# Patient Record
Sex: Female | Born: 1960 | Race: White | Hispanic: No | Marital: Married | Smoking: Never smoker
Health system: Southern US, Community
[De-identification: ages and names within clinical notes are randomized; demographics above are authoritative.]

## PROBLEM LIST (undated history)

## (undated) DIAGNOSIS — K8020 Calculus of gallbladder without cholecystitis without obstruction: Secondary | ICD-10-CM

## (undated) DIAGNOSIS — K219 Gastro-esophageal reflux disease without esophagitis: Secondary | ICD-10-CM

## (undated) DIAGNOSIS — E669 Obesity, unspecified: Secondary | ICD-10-CM

## (undated) DIAGNOSIS — Z87442 Personal history of urinary calculi: Secondary | ICD-10-CM

## (undated) DIAGNOSIS — F329 Major depressive disorder, single episode, unspecified: Secondary | ICD-10-CM

## (undated) DIAGNOSIS — J310 Chronic rhinitis: Secondary | ICD-10-CM

## (undated) DIAGNOSIS — K5792 Diverticulitis of intestine, part unspecified, without perforation or abscess without bleeding: Secondary | ICD-10-CM

## (undated) DIAGNOSIS — K5780 Diverticulitis of intestine, part unspecified, with perforation and abscess without bleeding: Secondary | ICD-10-CM

## (undated) DIAGNOSIS — I10 Essential (primary) hypertension: Secondary | ICD-10-CM

## (undated) DIAGNOSIS — E785 Hyperlipidemia, unspecified: Secondary | ICD-10-CM

## (undated) DIAGNOSIS — Z96659 Presence of unspecified artificial knee joint: Secondary | ICD-10-CM

## (undated) DIAGNOSIS — J189 Pneumonia, unspecified organism: Secondary | ICD-10-CM

## (undated) DIAGNOSIS — N289 Disorder of kidney and ureter, unspecified: Secondary | ICD-10-CM

## (undated) DIAGNOSIS — F419 Anxiety disorder, unspecified: Secondary | ICD-10-CM

## (undated) DIAGNOSIS — F32A Depression, unspecified: Secondary | ICD-10-CM

## (undated) HISTORY — DX: Calculus of gallbladder without cholecystitis without obstruction: K80.20

## (undated) HISTORY — DX: Hyperlipidemia, unspecified: E78.5

## (undated) HISTORY — DX: Anxiety disorder, unspecified: F41.9

## (undated) HISTORY — PX: TONSILLECTOMY: SUR1361

## (undated) HISTORY — DX: Essential (primary) hypertension: I10

## (undated) HISTORY — DX: Obesity, unspecified: E66.9

---
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
## 1997-12-11 ENCOUNTER — Other Ambulatory Visit: Admission: RE | Admit: 1997-12-11 | Discharge: 1997-12-11 | Payer: Self-pay | Admitting: Obstetrics and Gynecology

## 1999-05-22 ENCOUNTER — Other Ambulatory Visit: Admission: RE | Admit: 1999-05-22 | Discharge: 1999-05-22 | Payer: Self-pay | Admitting: *Deleted

## 1999-10-18 ENCOUNTER — Encounter: Payer: Self-pay | Admitting: Emergency Medicine

## 1999-10-18 ENCOUNTER — Emergency Department (HOSPITAL_COMMUNITY): Admission: EM | Admit: 1999-10-18 | Discharge: 1999-10-18 | Payer: Self-pay | Admitting: Emergency Medicine

## 2001-02-23 HISTORY — PX: OTHER SURGICAL HISTORY: SHX169

## 2002-04-04 ENCOUNTER — Encounter: Admission: RE | Admit: 2002-04-04 | Discharge: 2002-04-04 | Payer: Self-pay | Admitting: Occupational Medicine

## 2002-04-04 ENCOUNTER — Encounter: Payer: Self-pay | Admitting: Occupational Medicine

## 2002-05-29 ENCOUNTER — Emergency Department (HOSPITAL_COMMUNITY): Admission: EM | Admit: 2002-05-29 | Discharge: 2002-05-29 | Payer: Self-pay | Admitting: Emergency Medicine

## 2003-03-23 ENCOUNTER — Emergency Department (HOSPITAL_COMMUNITY): Admission: AD | Admit: 2003-03-23 | Discharge: 2003-03-23 | Payer: Self-pay | Admitting: Family Medicine

## 2003-04-10 ENCOUNTER — Encounter: Admission: RE | Admit: 2003-04-10 | Discharge: 2003-04-10 | Payer: Self-pay | Admitting: Internal Medicine

## 2003-06-23 ENCOUNTER — Emergency Department (HOSPITAL_COMMUNITY): Admission: EM | Admit: 2003-06-23 | Discharge: 2003-06-23 | Payer: Self-pay | Admitting: Family Medicine

## 2003-08-03 ENCOUNTER — Encounter (INDEPENDENT_AMBULATORY_CARE_PROVIDER_SITE_OTHER): Payer: Self-pay | Admitting: *Deleted

## 2003-08-03 ENCOUNTER — Ambulatory Visit (HOSPITAL_COMMUNITY): Admission: RE | Admit: 2003-08-03 | Discharge: 2003-08-04 | Payer: Self-pay | Admitting: General Surgery

## 2003-12-14 ENCOUNTER — Emergency Department (HOSPITAL_COMMUNITY): Admission: EM | Admit: 2003-12-14 | Discharge: 2003-12-14 | Payer: Self-pay | Admitting: Emergency Medicine

## 2004-07-13 ENCOUNTER — Emergency Department (HOSPITAL_COMMUNITY): Admission: EM | Admit: 2004-07-13 | Discharge: 2004-07-13 | Payer: Self-pay | Admitting: Family Medicine

## 2005-11-06 ENCOUNTER — Emergency Department (HOSPITAL_COMMUNITY): Admission: EM | Admit: 2005-11-06 | Discharge: 2005-11-06 | Payer: Self-pay | Admitting: Family Medicine

## 2006-03-27 ENCOUNTER — Emergency Department (HOSPITAL_COMMUNITY): Admission: EM | Admit: 2006-03-27 | Discharge: 2006-03-27 | Payer: Self-pay | Admitting: Emergency Medicine

## 2006-04-16 ENCOUNTER — Encounter (INDEPENDENT_AMBULATORY_CARE_PROVIDER_SITE_OTHER): Payer: Self-pay | Admitting: Specialist

## 2006-04-16 ENCOUNTER — Ambulatory Visit (HOSPITAL_COMMUNITY): Admission: RE | Admit: 2006-04-16 | Discharge: 2006-04-16 | Payer: Self-pay | Admitting: Obstetrics and Gynecology

## 2006-09-11 ENCOUNTER — Emergency Department (HOSPITAL_COMMUNITY): Admission: EM | Admit: 2006-09-11 | Discharge: 2006-09-11 | Payer: Self-pay | Admitting: Family Medicine

## 2007-06-21 ENCOUNTER — Emergency Department (HOSPITAL_COMMUNITY): Admission: EM | Admit: 2007-06-21 | Discharge: 2007-06-21 | Payer: Self-pay | Admitting: Family Medicine

## 2007-11-03 ENCOUNTER — Encounter: Payer: Self-pay | Admitting: Gastroenterology

## 2007-11-04 ENCOUNTER — Ambulatory Visit: Payer: Self-pay | Admitting: Gastroenterology

## 2007-11-04 DIAGNOSIS — R197 Diarrhea, unspecified: Secondary | ICD-10-CM

## 2007-11-11 ENCOUNTER — Emergency Department (HOSPITAL_COMMUNITY): Admission: EM | Admit: 2007-11-11 | Discharge: 2007-11-11 | Payer: Self-pay | Admitting: Emergency Medicine

## 2007-11-11 ENCOUNTER — Telehealth (INDEPENDENT_AMBULATORY_CARE_PROVIDER_SITE_OTHER): Payer: Self-pay | Admitting: *Deleted

## 2007-11-16 ENCOUNTER — Ambulatory Visit: Payer: Self-pay | Admitting: Gastroenterology

## 2008-01-09 ENCOUNTER — Telehealth: Payer: Self-pay | Admitting: Gastroenterology

## 2008-01-10 ENCOUNTER — Ambulatory Visit: Payer: Self-pay | Admitting: Gastroenterology

## 2008-01-10 DIAGNOSIS — R1319 Other dysphagia: Secondary | ICD-10-CM | POA: Insufficient documentation

## 2008-01-16 ENCOUNTER — Encounter: Payer: Self-pay | Admitting: Gastroenterology

## 2008-01-16 ENCOUNTER — Ambulatory Visit: Payer: Self-pay | Admitting: Gastroenterology

## 2008-01-22 ENCOUNTER — Encounter: Payer: Self-pay | Admitting: Gastroenterology

## 2008-01-25 ENCOUNTER — Telehealth: Payer: Self-pay | Admitting: Gastroenterology

## 2008-02-03 ENCOUNTER — Encounter: Payer: Self-pay | Admitting: Gastroenterology

## 2008-02-03 ENCOUNTER — Telehealth: Payer: Self-pay | Admitting: Gastroenterology

## 2008-05-03 ENCOUNTER — Encounter: Payer: Self-pay | Admitting: Gastroenterology

## 2008-05-07 ENCOUNTER — Encounter: Payer: Self-pay | Admitting: Gastroenterology

## 2008-05-09 ENCOUNTER — Telehealth (INDEPENDENT_AMBULATORY_CARE_PROVIDER_SITE_OTHER): Payer: Self-pay | Admitting: *Deleted

## 2008-05-10 ENCOUNTER — Encounter: Payer: Self-pay | Admitting: Gastroenterology

## 2008-12-09 ENCOUNTER — Encounter (INDEPENDENT_AMBULATORY_CARE_PROVIDER_SITE_OTHER): Payer: Self-pay | Admitting: *Deleted

## 2008-12-09 ENCOUNTER — Ambulatory Visit (HOSPITAL_COMMUNITY): Admission: RE | Admit: 2008-12-09 | Discharge: 2008-12-09 | Payer: Self-pay | Admitting: Emergency Medicine

## 2009-09-03 ENCOUNTER — Encounter: Payer: Self-pay | Admitting: Gastroenterology

## 2010-02-23 HISTORY — PX: LAPAROSCOPIC CHOLECYSTECTOMY W/ CHOLANGIOGRAPHY: SUR757

## 2010-03-16 ENCOUNTER — Encounter: Payer: Self-pay | Admitting: Internal Medicine

## 2010-03-25 NOTE — Procedures (Signed)
Summary: Colon   Colonoscopy  Procedure date:  11/16/2007  Findings:      Location:  [facility].   Patient Name: [name], [name]. MRN:  Procedure Procedures: Colonoscopy CPT: [phone].  Personnel: Endoscopist: Rachael Fee, MD.  Referred By: Creola Corn, MD.  Exam Location: Exam performed in Endoscopy Suite. Outpatient  Patient Consent: Procedure, Alternatives, Risks and Benefits discussed, consent obtained, from patient. Consent was obtained by the RN.  Indications Symptoms: Diarrhea Hematochezia.  History  Current Medications: Patient is not currently taking Coumadin.  Comments: Patient history reviewed and updated, pre-procedure physical performed prior to initiation of sedation? yes Pre-Exam Physical: Performed Nov 16, 2007. Cardio-pulmonary exam, Abdominal exam, Mental status exam WNL.  Exam Exam: Extent of exam reached: Terminal Ileum, extent intended: Terminal Ileum.  The cecum was identified by appendiceal orifice and IC valve. Patient position: on left side. Time to Cecum: 00:03:37. Time for Withdrawl: 00:07:34. Colon retroflexion performed. Images taken. ASA Classification: II. Tolerance: good.  Monitoring: Pulse and BP monitoring, Oximetry used. Supplemental O2 given.  Colon Prep Prep results: good.  Sedation Meds: Patient assessed and found to be appropriate for moderate (conscious) sedation. Fentanyl 25 mcg. given IV. Versed 4 mg. given IV.  Findings NORMAL EXAM: Ileum.  - DIVERTICULOSIS: Descending Colon to Sigmoid Colon.  - NORMAL EXAM: Cecum to Rectum. Comments: OTHERWISE NORMAL EXAMINATION.  HEMORRHOIDS: External. Size: Small.   Assessment Abnormal examination, see findings above.  Comments: LEFT SIDED DIVERTICULOSIS, HEMORRHOIDS.  OTHERWISE NORMAL EXAMINATION TO THE TERMINAL ILEUM (NO POLYP, CANCERS, COLITIS, ILEITIS).  HER DIARRHEA STARTED AT SAME TIME AS HER HUSBAND GETTING DIARRHEA, TURNED BLOOD FOR SEVERAL DAYS, THEN  RESOLVED.  SHE'S HAD NO BLEEDING, NO DIARRHEA IN SEVERAL DAYS.  THIS WAS ALMOST CERTAINLY AN INFECTIOUS COLITIS.  OTHERWISE THE PATIENT SHOULD CONTINUE TO FOLLOW CURRENT COLORECTAL CANCER SCREENING GUIDELINES WITH A REPEAT COLONOSCOPY IN 10 YEARS. Events  Unplanned Interventions: No intervention was required.  Unplanned Events: There were no complications. Plans Comments: COLONOSCOPY IN 10 YEARS. This report was created from the original endoscopy report, which was reviewed and signed by the above listed endoscopist.

## 2010-03-25 NOTE — Medication Information (Signed)
Summary: Prior [employer] Approved for Omeprazole/[employer]  Prior [employer] Approved for Omeprazole/[employer]   Imported By: Sherian Rein 09/09/2009 13:47:39  _____________________________________________________________________  External Attachment:    Type:   Image     Comment:   External Document

## 2010-03-25 NOTE — Procedures (Signed)
Summary: EGD   EGD  Procedure date:  01/16/2008  Findings:      Location: [facility]   Patient Name: [name] [name], [name] [name]. MRN:  Procedure Procedures: Panendoscopy (EGD) CPT: 43235.    with balloon dilation. CPT: [id number].  Personnel: Endoscopist: Rachael Fee, MD.  Exam Location: Exam performed in Endoscopy Suite. Outpatient  Patient Consent: Procedure, Alternatives, Risks and Benefits discussed, consent obtained, from patient. Consent was obtained by the RN.  Indications Symptoms: Dysphagia. Reflux symptoms  History  Current Medications: Patient is not currently taking Coumadin.  Comments: Patient history reviewed and updated, pre-procedure physical performed prior to initiation of sedation? yes Pre-Exam Physical: Performed Jan 16, 2008  Cardio-pulmonary exam, Abdominal exam, Mental status exam WNL.  Exam Exam Info: Maximum depth of insertion Duodenum, intended Duodenum. Patient position: on left side. Gastric retroflexion performed. Images taken. ASA Classification: II. Tolerance: good.  Sedation Meds: Patient assessed and found to be appropriate for moderate (conscious) sedation. Fentanyl 50 mcg. given IV. Versed 8 mg. given IV.  Monitoring: BP and pulse monitoring done. Oximetry used. Supplemental O2 given  Findings - Normal: Proximal Esophagus to Duodenal 2nd Portion. Comments: OTHERWISE NORMAL EXAMINATION.  HIATAL HERNIA:  STRICTURE / STENOSIS: Stricture in Distal Esophagus.  Comment: MINOR SHELF-LIKE, FOCAL STRICTURE, LIKELY CHRONIC GERD (PEPTIC STRICTURE).  THIS WAS DILATED WITH A TTS BALLOON HELD INFLATED TO FOR 1 MINUTE.  - MUCOSAL ABNORMALITY: Body to Antrum. Biopsy/Mucosal Abn taken. Path # 1. Comment: MODERATE, NON-SPECIFIC GASTRITIS.   Assessment Abnormal examination, see findings above.  Comments: PEPTIC STRICTURE, DILATED UP TO WITH TTS BALLOON. MODERATE, NON- SPECIFIC GASTRITIS, BIOPSIED TO CHECK FOR H. PYLORI,  NEOPLASM.  HER UGI SYMPTOMS HAVE DEFINITELY IMPROVED SINCE BEGINNING DAILY PPI (NEXIUM).  I WILL CALL HER IN A PRESCRIPTION FOR THIS. Events  Unplanned Intervention: No unplanned interventions were required.  Unplanned Events: There were no complications. This report was created from the original endoscopy report, which was reviewed and signed by the above listed endoscopist.

## 2010-06-08 ENCOUNTER — Emergency Department (HOSPITAL_COMMUNITY): Payer: BC Managed Care – PPO

## 2010-06-08 ENCOUNTER — Emergency Department (HOSPITAL_COMMUNITY)
Admission: EM | Admit: 2010-06-08 | Discharge: 2010-06-08 | Disposition: A | Discharge status: Home / Self Care | Payer: BC Managed Care – PPO | Attending: Emergency Medicine | Admitting: Emergency Medicine

## 2010-06-08 DIAGNOSIS — W1809XA Striking against other object with subsequent fall, initial encounter: Secondary | ICD-10-CM | POA: Insufficient documentation

## 2010-06-08 DIAGNOSIS — I10 Essential (primary) hypertension: Secondary | ICD-10-CM | POA: Insufficient documentation

## 2010-06-08 DIAGNOSIS — S63509A Unspecified sprain of unspecified wrist, initial encounter: Secondary | ICD-10-CM | POA: Insufficient documentation

## 2010-06-08 DIAGNOSIS — Y929 Unspecified place or not applicable: Secondary | ICD-10-CM | POA: Insufficient documentation

## 2010-06-08 DIAGNOSIS — S6390XA Sprain of unspecified part of unspecified wrist and hand, initial encounter: Secondary | ICD-10-CM | POA: Insufficient documentation

## 2010-07-11 NOTE — Op Note (Signed)
NAME:  [name] [name], [name] [name]                         ACCOUNT NO.:  [id number]   MEDICAL RECORD NO.:  [id number]                   PATIENT TYPE:  OIB   LOCATION:  6707                                 FACILITY:  [facility]   PHYSICIAN:  Gabrielle Dare. [name], M.D.             DATE OF BIRTH:  [birth year]   DATE OF PROCEDURE:  08/02/2003  DATE OF DISCHARGE:                                 OPERATIVE REPORT   PREOPERATIVE DIAGNOSIS:  Symptomatic cholelithiasis.   POSTOPERATIVE DIAGNOSIS:  Symptomatic cholelithiasis.   OPERATION PERFORMED:  Laparoscopic cholecystectomy with intraoperative  cholangiogram.   SURGEON:  Gabrielle Dare. [name], M.D.   ASSISTANT:  Sharlet Salina T. Hoxworth, M.D.   ANESTHESIA:  General.   INDICATIONS FOR PROCEDURE:  The patient is a 50 year old white female who  has been having intermittent biliary colic.  Ultrasound revealed multiple  gallstones and she presents for elective cholecystectomy.   DESCRIPTION OF PROCEDURE:  Informed consent was obtained.  The patient  received intravenous antibiotics.  She was brought to the operating room.  General anesthesia was administered.  Her abdomen was prepped and draped in  sterile fashion.  An infraumbilical incision was made.  Subcutaneous tissues  were dissected down.  The anterior fascia was divided.  We then attempted to  enter the peritoneal cavity.  Her peritoneum kept dropping away from our  site and we could not safely get good entry into the peritoneal cavity, so  the decision was made to place an 11 mm OptiView in the right lateral  abdomen.  This was accomplished easily and once that 11 was placed, the  abdomen was insufflated with carbon dioxide and then a second 11 was placed  in the infraumbilical site under direct vision, followed by an 11 mm  epigastric port and a 5 mm lateral port which were also placed under direct  vision.  The dome of the gallbladder was then retracted superomedially.  Several adhesions of omentum were  taken down off the gallbladder revealing  the infundibulum which was retracted inferolaterally.  Dissection began  laterally and progressed medially easily identifying the cystic duct.  This  was dissected  until a large window was present  between the cystic duct,  the infundibulum and the liver bed.  A clip was placed on the  infundibulocystic duct junction.  A small nick was made in the cystic duct  and a Reddick cholangiogram catheter was inserted.  Intraoperative  cholangiogram was obtained which demonstrated good flow into the duodenum  and no common bile duct opacities.  There was a good length of cystic duct.  Cholangiogram was completed and the cholangiogram catheter was removed.  Three clips were placed proximally on the cystic duct and it was divided.  Further dissection revealed a small anterior cystic artery which was clipped  twice proximally, once distally and divided.  Further dissection revealed a  larger posterior branch of the cystic artery which was  clipped twice  proximally, once distally and divided.  The gallbladder was taken off the  liver bed with Bovie cautery.  Several areas on the liver bed were  cauterized to get good hemostasis and the gallbladder was placed in an  EndoCatch bag and removed from the abdomen via the infraumbilical port site.  Once this was accomplished, the liver bed was copiously irrigated.  A few  small areas were cauterized to get excellent hemostasis.  Once this was  accomplished, the abdomen was irrigated and the irrigant returned clear.  The ports were removed under direct vision and the pneumoperitoneum was  released.  The infraumbilical fascia was closed with two separate  pursestring sutures of 0 Vicryl.  All the wounds were copiously irrigated.  Sponge, needle and instrument counts were correct.  0.25% Marcaine with  epinephrine  was used for local anesthetic and the skin of each was closed with running 4-  0 Vicryl subcuticular  stitch.  Benzoin and Steri-Strips and sterile  dressings were applied.  The patient tolerated the procedure well without  apparent complications and was taken to recovery room in stable condition.                                               Gabrielle Dare [name], M.D.    BET/MEDQ  D:  08/03/2003  T:  08/03/2003  Job:  [id number]

## 2010-07-11 NOTE — Op Note (Signed)
NAME:  [name] [name], [name] [name] NO.:  [id number]   MEDICAL RECORD NO.:  [id number]          PATIENT TYPE:  AMB   LOCATION:  SDC                           FACILITY:  [facility]   PHYSICIAN:  Zelphia Cairo, MD    DATE OF BIRTH:  [birth year]   DATE OF PROCEDURE:  04/16/2006  DATE OF DISCHARGE:                               OPERATIVE REPORT   PREOPERATIVE DIAGNOSIS:  Menorrhagia.   POSTOPERATIVE DIAGNOSIS:  Menorrhagia.   PROCEDURE:  Hysteroscopy, dilatation and curettage, NovaSure endometrial  ablation.   SURGEON:  Renaldo Fiddler.   ASSISTANT:  None.   ANESTHESIA:  General.   SPECIMEN:  Endometrial curettings.   ESTIMATED BLOOD LOSS:  Minimal.   FLUID DEFICIT:  20 mL.   COMPLICATIONS:  None.   CONDITION:  Stable and extubated to recovery room.   PROCEDURE:  The patient was taken to the operating room where general  anesthesia was obtained.  She was placed in the dorsal lithotomy  position using Allen stirrups.  She was prepped and draped in sterile  fashion, and a sterile catheter was used to drain her bladder for  approximately 50 mL of clear urine.  A bivalve speculum was then placed  in the vagina, and a single-tooth tenaculum was used to grasp the  anterior lip of the cervix.  A cervical block was performed with 1%  lidocaine.  The uterus was then sounded to 10 cm.  The cervix was  sounded to 4.5 cm.  The diagnostic hysteroscope was then easily inserted  through the cervix, and the uterine cavity was inspected.  There was a  small amount of fluffy material in the posterior left wall of the  uterus.  The remainder of the uterine cavity appeared atrophic.  The  hysteroscope was then removed.  The cervix was gently dilated, and the  NovaSure device was inserted.  The cavity assessment was passed, and the  NovaSure ablation was performed using standard  manufacturer guidelines.  There were no complications.  The NovaSure  device was removed.  Single-tooth tenaculum and  speculum were removed  from the vagina.  The patient was taken to the recovery room in stable  condition.      Zelphia Cairo, MD  Electronically Signed     GA/MEDQ  D:  04/16/2006  T:  04/16/2006  Job:  [id number]

## 2010-07-11 NOTE — H&P (Signed)
NAME:  [name] [name], [name] [name] NO.:  [id number]   MEDICAL RECORD NO.:  [id number]          PATIENT TYPE:  AMB   LOCATION:  SDC                           FACILITY:  [facility]   PHYSICIAN:  Zelphia Cairo, MD    DATE OF BIRTH:  1961/01/26   DATE OF ADMISSION:  DATE OF DISCHARGE:                              HISTORY & PHYSICAL   50 year old white female who presented in January 2008 with complaints  of heavy menstrual cycles.  These have become worse over the past few  years and are now associated with large blood clots.  She denies  bleeding between her periods and her last menstrual period was February 02, 2006.  She also notices some dysmenorrhea with each cycle.   MEDICAL HISTORY:  Anxiety, chronic hypertension.   SURGICAL HISTORY:  Neck surgery for an infected salivary gland,  laparotomy with salpingectomy for an ectopic pregnancy, and knee  surgery.   ALLERGIES:  ACCUPRIL.   MEDICATIONS:  Zoloft and two blood pressure medications (unsure of their  names).   OB HISTORY:  One full term vaginal delivery, one miscarriage with a D&C,  and one ectopic.   GYN HISTORY:  Negative for abnormal Pap smears.  Pap smear performed in  the office January 2008 returned negative.   PHYSICAL EXAMINATION:  VITAL SIGNS:  Height 5 feet 7 inches, weight 246,  blood pressure 124/80, hemoglobin 15.  HEAD AND NECK:  Normal.  HEART:  Regular rate and rhythm.  LUNGS: Clear bilaterally.  BREASTS: Symmetrical.  No palpable masses.  ABDOMEN: Soft, obese and nontender.  PELVIC: Exam shows normal external female genitalia.  Vagina and cervix  are normal.  No lesions.  Small amount of blood in the vault due to  menses.  Uterus is mobile and nontender.  No adnexal masses noted.   ASSESSMENT/PLAN:  50 year old white female with a normal well woman  exam.  Sonohysterogram was performed showing a thickened and homogenous  myometrium, normal appearing bilateral ovaries.  There was a 1.6 cm  polypoid mass within the cavity after saline infusion.  Surgical versus  medical treatment options were discussed with the patient.  The patient  elects to proceed with hysteroscopy, D&C, with NovaSure ablation.      Zelphia Cairo, MD  Electronically Signed     GA/MEDQ  D:  04/15/2006  T:  04/15/2006  Job:  [id number]

## 2010-11-24 LAB — BASIC METABOLIC PANEL
CO2: 24
Calcium: 9.4
Chloride: 106
Creatinine, Ser: 0.91
GFR calc non Af Amer: >60
Glucose, Bld: 90
Potassium: 3.6

## 2010-11-24 LAB — CBC
Hemoglobin: 15
MCHC: 34.6
MCV: 92.9
Platelets: 322
RBC: 4.65

## 2010-11-24 LAB — DIFFERENTIAL
Basophils Relative: 1
Eosinophils Absolute: 0.1
Eosinophils Relative: 2
Lymphocytes Relative: 21
Lymphs Abs: 1.8
Monocytes Absolute: 1
Monocytes Relative: 11

## 2011-01-14 ENCOUNTER — Other Ambulatory Visit: Payer: Self-pay | Admitting: Gastroenterology

## 2011-02-20 ENCOUNTER — Encounter: Payer: Self-pay | Admitting: Gastroenterology

## 2011-03-13 ENCOUNTER — Ambulatory Visit (INDEPENDENT_AMBULATORY_CARE_PROVIDER_SITE_OTHER): Payer: BC Managed Care – PPO | Admitting: Gastroenterology

## 2011-03-13 ENCOUNTER — Encounter: Payer: Self-pay | Admitting: Gastroenterology

## 2011-03-13 ENCOUNTER — Other Ambulatory Visit (INDEPENDENT_AMBULATORY_CARE_PROVIDER_SITE_OTHER): Payer: BC Managed Care – PPO

## 2011-03-13 DIAGNOSIS — R109 Unspecified abdominal pain: Secondary | ICD-10-CM

## 2011-03-13 DIAGNOSIS — K625 Hemorrhage of anus and rectum: Secondary | ICD-10-CM

## 2011-03-13 LAB — COMPREHENSIVE METABOLIC PANEL
AST: 22 U/L (ref 0–37)
Alkaline Phosphatase: 59 U/L (ref 39–117)
BUN: 24 mg/dL — ABNORMAL HIGH (ref 6–23)
Chloride: 101 mEq/L (ref 96–112)
Creatinine, Ser: 0.9 mg/dL (ref 0.4–1.2)
Glucose, Bld: 88 mg/dL (ref 70–99)
Sodium: 139 mEq/L (ref 135–145)

## 2011-03-13 LAB — CBC WITH DIFFERENTIAL/PLATELET
Basophils Absolute: 0 10*3/uL (ref 0.0–0.1)
Eosinophils Absolute: 0.1 10*3/uL (ref 0.0–0.7)
Hemoglobin: 14.6 g/dL (ref 12.0–15.0)
Lymphocytes Relative: 23.3 % (ref 12.0–46.0)
MCHC: 33.9 g/dL (ref 30.0–36.0)
MCV: 90.8 fl (ref 78.0–100.0)
Neutro Abs: 5.2 10*3/uL (ref 1.4–7.7)
Neutrophils Relative %: 65.9 % (ref 43.0–77.0)

## 2011-03-13 LAB — H. PYLORI ANTIBODY, IGG: H Pylori IgG: NEGATIVE

## 2011-03-13 MED ORDER — HYOSCYAMINE SULFATE ER 0.375 MG PO TB12: 0.3750 mg | ORAL_TABLET | Freq: Two times a day (BID) | ORAL | Status: DC | PRN

## 2011-03-13 NOTE — Patient Instructions (Addendum)
You will have labs checked today in the basement lab.  Please head down after you check out with the front desk  (cbc, cmet, h. Pylori serology) Please start taking citrucel (orange flavored) powder fiber supplement.  This may cause some bloating at first but that usually goes away. Begin with a small spoonful and work your way up to a large, heaping spoonful daily over a week. Trial of twice daily antispasm medicine, called into your pharmacy.

## 2011-03-13 NOTE — Progress Notes (Signed)
Review of pertinent gastrointestinal problems: 1. routine risk for colon cancer. Colonoscopy September 2009 showed left-sided diverticulosis and hemorrhoids. Otherwise it was normal to the terminal ileum. This was done for minor hematochezia. She was recommended to have repeat examination at 10 year interval. 2. GERD, reflux. EGD November 2009 found focal, peptic appearing stricture at GE junction that was dilated to 20 mm. Also gastritis was noted, biopsies were negative for H. Pylori.  HPI: This is a   very pleasant 51 year old woman whom I last saw over 3 years ago. old woman whom I last saw over 3 years ago.  "having the same problems again."  Having minor rectal bleeding.  Will have 5 bms a week.  Rarely has to push, strain to move her bowel.  On days without a  BM, she does not have pain.  Some days she will have 1 bm, some days she will have 3-4 smaller bms.   Has pains in mid abdomen, constant. Can have pains LLQ during a BM.  THis LLQ pain goes away with BM.  She has been losing weight, probably has lost 13 pounds, intentionally.  Can have nausea with eating at times, anorexia.  She sees rectal bleeding, about once a month or even less.  Bleeding stopped for 2 years, but has come back.  No labs.  No fevers or chills except during URI 3 months ago.  Mid abd pain, dull pain.  More like a soreness.  Eating makes it worse but it is constant.  Rare nsaids, no new meds in past few months.  Review of systems: Pertinent positive and negative review of systems were noted in the above HPI section. Complete review of systems was performed and was otherwise normal.    Past Medical History  Diagnosis Date  . Obesity   . Anxiety disorder   . Gallstones   . Hyperlipidemia   . Hypertension     Past Surgical History  Procedure Date  . Cholecystectomy     Current Outpatient Prescriptions  Medication Sig Dispense Refill  . hydrochlorothiazide (HYDRODIURIL) 25 MG tablet Take 25 mg by mouth daily.      Marland Kitchen Lysine 500 MG CAPS Take by  mouth as directed.      . metoprolol succinate (TOPROL-XL) 25 MG 24 hr tablet Takes one tablet by mouth once daily      . Multiple Vitamins-Minerals (WOMENS MULTIVITAMIN PLUS PO) Take 1 tablet by mouth daily.      Marland Kitchen omeprazole (PRILOSEC) 40 MG capsule TAKE 1 CAPSULE BY MOUTH EVERY DAY 30 MINUTES BEFORE MEAL  30 capsule  10  . sertraline (ZOLOFT) 100 MG tablet Takes as directed      . triamterene-hydrochlorothiazide (MAXZIDE-25) 37.5-25 MG per tablet Takes one tablet by mouth once daily        Allergies as of 03/13/2011 - Review Complete 03/13/2011  Allergen Reaction Noted  . Quinapril hcl  01/10/2008    Family History  Problem Relation Age of Onset  . Colon cancer Neg Hx     History   Social History  . Marital Status: Married    Spouse Name: N/A    Number of Children: 1  . Years of Education: N/A   Occupational History  . Data Manger [employer]   Social History Main Topics  . Smoking status: Never Smoker   . Smokeless tobacco: Never Used  . Alcohol Use: No  . Drug Use: No  . Sexually Active: Not on file   Other Topics Concern  . Not on file   Social History  Narrative   Daily caffeine        Physical Exam: BP 136/78  Pulse 64  Ht 5\' 5"  (1.651 m)  Wt 108.863 kg (240 lb)  BMI 39.94 kg/m2 Constitutional: generally well-appearing Psychiatric: alert and oriented x3 Eyes: extraocular movements intact Mouth: oral pharynx moist, no lesions Neck: supple no lymphadenopathy Cardiovascular: heart regular rate and rhythm Lungs: clear to auscultation bilaterally Abdomen: soft, nontender, nondistended, no obvious ascites, no peritoneal signs, normal bowel sounds Extremities: no lower extremity edema bilaterally Skin: no lesions on visible extremities Rectal examination with female assistance in the room: Small external anal hemorrhoids, brown stool in vault that was Hemoccult negative   Assessment and plan: 51 y.o. female with  intermittent, minor hemorrhoidal  bleeding, dyspepsia, left lower quadrant discomfort  She is going to try fiber supplements as well as antispasmodic medicines. She will also get a basic set of blood tests today including a CBC, complete metabolic profile and H. pylori serologies.

## 2011-03-16 ENCOUNTER — Telehealth: Payer: Self-pay | Admitting: Gastroenterology

## 2011-03-16 DIAGNOSIS — R1110 Vomiting, unspecified: Secondary | ICD-10-CM

## 2011-03-16 NOTE — Telephone Encounter (Signed)
Pt aware will be in tomorrow for xray Order entered into epic

## 2011-03-16 NOTE — Telephone Encounter (Signed)
Pt  started vomiting and having increasing abd pain.  This all started last night and continued until 2 am.  She did have some diarrhea as well.  She has some nausea today but no diarrhea or vomiting.  She feels better but occasional abd pain.   She has only taken 1 levbid since being seen on Friday and 1 dose of citrucel.  No fever. Please advise

## 2011-03-16 NOTE — Telephone Encounter (Signed)
Needs KUB, flat and upright today or tomorrow.  Should be on daily fiber and twice daily levbid.

## 2011-03-17 ENCOUNTER — Ambulatory Visit (INDEPENDENT_AMBULATORY_CARE_PROVIDER_SITE_OTHER)
Admission: RE | Admit: 2011-03-17 | Discharge: 2011-03-17 | Disposition: A | Discharge status: Home / Self Care | Payer: BC Managed Care – PPO | Source: Ambulatory Visit | Attending: Gastroenterology | Admitting: Gastroenterology

## 2011-03-17 DIAGNOSIS — R109 Unspecified abdominal pain: Secondary | ICD-10-CM

## 2011-03-17 DIAGNOSIS — R1110 Vomiting, unspecified: Secondary | ICD-10-CM

## 2011-11-04 DIAGNOSIS — K2970 Gastritis, unspecified, without bleeding: Secondary | ICD-10-CM | POA: Insufficient documentation

## 2011-11-11 ENCOUNTER — Encounter (HOSPITAL_COMMUNITY): Payer: Self-pay | Admitting: Pharmacy Technician

## 2011-11-17 ENCOUNTER — Other Ambulatory Visit: Payer: Self-pay

## 2011-11-17 ENCOUNTER — Ambulatory Visit (HOSPITAL_COMMUNITY)
Admission: RE | Admit: 2011-11-17 | Discharge: 2011-11-17 | Disposition: A | Discharge status: Home / Self Care | Payer: BC Managed Care – PPO | Source: Ambulatory Visit | Attending: Orthopedic Surgery | Admitting: Orthopedic Surgery

## 2011-11-17 ENCOUNTER — Encounter (HOSPITAL_COMMUNITY)
Admission: RE | Admit: 2011-11-17 | Discharge: 2011-11-17 | Disposition: A | Discharge status: Home / Self Care | Payer: BC Managed Care – PPO | Source: Ambulatory Visit | Attending: Orthopedic Surgery | Admitting: Orthopedic Surgery

## 2011-11-17 ENCOUNTER — Encounter (HOSPITAL_COMMUNITY): Payer: Self-pay

## 2011-11-17 DIAGNOSIS — I10 Essential (primary) hypertension: Secondary | ICD-10-CM | POA: Insufficient documentation

## 2011-11-17 DIAGNOSIS — M1710 Unilateral primary osteoarthritis, unspecified knee: Secondary | ICD-10-CM | POA: Insufficient documentation

## 2011-11-17 DIAGNOSIS — Z01810 Encounter for preprocedural cardiovascular examination: Secondary | ICD-10-CM | POA: Insufficient documentation

## 2011-11-17 DIAGNOSIS — Z01812 Encounter for preprocedural laboratory examination: Secondary | ICD-10-CM | POA: Insufficient documentation

## 2011-11-17 HISTORY — DX: Gastro-esophageal reflux disease without esophagitis: K21.9

## 2011-11-17 LAB — BASIC METABOLIC PANEL
Calcium: 9.5 mg/dL (ref 8.4–10.5)
Creatinine, Ser: 0.78 mg/dL (ref 0.50–1.10)
GFR calc non Af Amer: >90 mL/min (ref >90)
Glucose, Bld: 82 mg/dL (ref 70–99)
Sodium: 138 mEq/L (ref 135–145)

## 2011-11-17 LAB — CBC
MCH: 29.9 pg (ref 26.0–34.0)
MCV: 90.4 fL (ref 78.0–100.0)
Platelets: 261 10*3/uL (ref 150–400)
RBC: 4.38 MIL/uL (ref 3.87–5.11)
RDW: 13.5 % (ref 11.5–15.5)

## 2011-11-17 LAB — URINALYSIS, ROUTINE W REFLEX MICROSCOPIC
Bilirubin Urine: NEGATIVE
Glucose, UA: NEGATIVE mg/dL
Hgb urine dipstick: NEGATIVE
Ketones, ur: NEGATIVE mg/dL
Protein, ur: NEGATIVE mg/dL
Urobilinogen, UA: 0.2 mg/dL (ref 0.0–1.0)

## 2011-11-17 LAB — APTT: aPTT: 35 seconds (ref 24–37)

## 2011-11-17 LAB — SURGICAL PCR SCREEN: MRSA, PCR: NEGATIVE

## 2011-11-17 LAB — PROTIME-INR: Prothrombin Time: 11.9 seconds (ref 11.6–15.2)

## 2011-11-17 NOTE — Patient Instructions (Signed)
20      Your procedure is scheduled on:  Monday 11/23/2011 at 0715 am  Report to [facility] at  0515 AM.  Call this number if you have problems the morning of surgery: [phone]   Remember:   Do not eat food or drink liquids after midnight!  Take these medicines the morning of surgery with A SIP OF WATER: Prilosec, Zoloft   Do not bring valuables to the hospital.  .  Leave suitcase in the car. After surgery it may be brought to your room.  For patients admitted to the hospital, checkout time is 11:00 AM the day of              Discharge.    Special Instructions: See [facility] Preparing  For Surgery Instruction Sheet.  Do not wear jewelry, lotions powders, perfumes. Women do not shave legs or underarms for 12 hours before showers. Contacts, partial plates, or dentures may not be worn into surgery.                          Patients discharged the day of surgery will not be allowed to drive home. If going home the same day of surgery, must have someone stay with you first 24 hrs.at home and arrange for someone to drive you home from the              Hospital.   Please read over the following fact sheets that you were given: MRSA              INFORMATION, sleep apnea sheet, Incentive Spirometry sheet, Blood Transfusion sheet               Telford Nab.Jakyri Brunkhorst,RN,BSN [phone]

## 2011-11-18 NOTE — Progress Notes (Signed)
H&P performed 11/18/11 Dictation # [id number]

## 2011-11-19 NOTE — H&P (Signed)
NAME:  [name] [name]               ACCOUNT NO.:  [id number]  MEDICAL RECORD NO.:  [id number]  LOCATION:  PADM                         FACILITY:  [facility]  PHYSICIAN:  Matthew D. Olin, M.D.  DATE OF BIRTH:  04/04/1960  DATE OF ADMISSION:  11/17/2011 DATE OF DISCHARGE:  11/17/2011                             HISTORY & PHYSICAL   DATE OF SURGERY:  November 23, 2011.  ADMITTING DIAGNOSIS:  End-stage osteoarthritis, right knee.  PROPOSED PROCEDURE:  Total knee arthroplasty, right knee.  HISTORY OF PRESENT ILLNESS:  This is a 51-year-old lady with history of end-stage osteoarthritis of her right knee has failed conservative treatment.  After discussion of treatments, benefits, risks and options, the patient is now scheduled for total knee arthroplasty of the right knee.  Note that her medical doctor is Dr. John Russo.  She plans on going home after surgery.  She is a candidate for tranexamic acid and dexamethasone and will receive both at surgery and she is given her home medicines of aspirin, Robaxin, iron, MiraLax, and Colace to take postoperatively.  PAST MEDICAL HISTORY:  Drug allergies to ACCUPRIL with swelling and hives.  CURRENT MEDICATIONS: 1. Zoloft 100 mg once a day. 2. Omeprazole 40 mg once a day. 3. Triamterene/hydrochlorothiazide 37.5/25 once a day.  MEDICAL ILLNESSES:  Anxiety, hypertension, reflux.  PREVIOUS SURGERIES:  Knee arthroscopy, cholecystectomy, C-section with tubal pregnancy.  FAMILY HISTORY:  Positive for bone cancer, hypertension, and kidney disease.  SOCIAL HISTORY:  The patient is married.  She works as a data manager. She does not smoke and drinks once a month and again plans on going home after surgery.  REVIEW OF SYSTEMS:  CENTRAL NERVOUS SYSTEM:  Negative for headache, blurred vision, or dizziness.  PULMONARY:  Negative for shortness of breath, PND, orthopnea. CARDIOVASCULAR:  Negative for chest pain, palpitation.  GI:  Negative for ulcers,  hepatitis.  GU:  Negative for urinary tract difficulty.  MUSCULOSKELETAL:  Positive as in HPI.  PHYSICAL EXAMINATION:  VITAL SIGNS:  Blood pressure 139/92, pulse 76 and regular, respirations 14. HEENT:  Head normocephalic.  Nose patent.  Ears patent.  Pupils are equal, round, and reactive to light.  Throat without injection. NECK:  Supple without adenopathy.  Carotids 2+ without bruit. CHEST:  Clear to auscultation.  No rales or rhonchi.  Respirations 14. HEART:  Regular rate and rhythm at 76 beats per minute without murmur. ABDOMEN:  Soft.  Active bowel sounds.  No masses or organomegaly. NEUROLOGIC:  The patient is alert and oriented to time, place, and person.  Cranial nerves II-XII grossly intact. EXTREMITIES:  Shows a valgus deformity of the right knee, 3 degrees of hyperextension, further flexion to 125 degrees.  Sensation and circulation are intact.  ASSESSMENT:  End-stage osteoarthritis, right knee.  PLAN:  Total knee arthroplasty, right knee.     Kristen Fromm J. Roopa Graver, P.A.   ______________________________ Matthew D. Olin, M.D.    SJC/MEDQ  D:  11/18/2011  T:  11/19/2011  Job:  [id number] 

## 2011-11-23 ENCOUNTER — Encounter (HOSPITAL_COMMUNITY): Payer: Self-pay | Admitting: *Deleted

## 2011-11-23 ENCOUNTER — Encounter (HOSPITAL_COMMUNITY): Payer: Self-pay | Admitting: Anesthesiology

## 2011-11-23 ENCOUNTER — Inpatient Hospital Stay (HOSPITAL_COMMUNITY)
Admission: RE | Admit: 2011-11-23 | Discharge: 2011-11-25 | DRG: 209 | Disposition: A | Discharge status: Home Health | Payer: BC Managed Care – PPO | Source: Ambulatory Visit | Attending: Orthopedic Surgery | Admitting: Orthopedic Surgery

## 2011-11-23 ENCOUNTER — Ambulatory Visit (HOSPITAL_COMMUNITY): Payer: BC Managed Care – PPO | Admitting: Anesthesiology

## 2011-11-23 ENCOUNTER — Encounter (HOSPITAL_COMMUNITY)
Admission: RE | Disposition: A | Discharge status: Home Health | Payer: Self-pay | Source: Ambulatory Visit | Attending: Orthopedic Surgery

## 2011-11-23 DIAGNOSIS — M1710 Unilateral primary osteoarthritis, unspecified knee: Principal | ICD-10-CM | POA: Diagnosis present

## 2011-11-23 DIAGNOSIS — Z888 Allergy status to other drugs, medicaments and biological substances status: Secondary | ICD-10-CM

## 2011-11-23 DIAGNOSIS — E669 Obesity, unspecified: Secondary | ICD-10-CM

## 2011-11-23 DIAGNOSIS — Z7982 Long term (current) use of aspirin: Secondary | ICD-10-CM

## 2011-11-23 DIAGNOSIS — F411 Generalized anxiety disorder: Secondary | ICD-10-CM | POA: Diagnosis present

## 2011-11-23 DIAGNOSIS — Z9089 Acquired absence of other organs: Secondary | ICD-10-CM

## 2011-11-23 DIAGNOSIS — Z96659 Presence of unspecified artificial knee joint: Secondary | ICD-10-CM

## 2011-11-23 DIAGNOSIS — K219 Gastro-esophageal reflux disease without esophagitis: Secondary | ICD-10-CM | POA: Diagnosis present

## 2011-11-23 DIAGNOSIS — F3289 Other specified depressive episodes: Secondary | ICD-10-CM | POA: Diagnosis present

## 2011-11-23 DIAGNOSIS — D62 Acute posthemorrhagic anemia: Secondary | ICD-10-CM | POA: Diagnosis not present

## 2011-11-23 DIAGNOSIS — Z79899 Other long term (current) drug therapy: Secondary | ICD-10-CM

## 2011-11-23 DIAGNOSIS — I10 Essential (primary) hypertension: Secondary | ICD-10-CM | POA: Diagnosis present

## 2011-11-23 DIAGNOSIS — D500 Iron deficiency anemia secondary to blood loss (chronic): Secondary | ICD-10-CM

## 2011-11-23 DIAGNOSIS — E876 Hypokalemia: Secondary | ICD-10-CM

## 2011-11-23 DIAGNOSIS — F329 Major depressive disorder, single episode, unspecified: Secondary | ICD-10-CM | POA: Diagnosis present

## 2011-11-23 DIAGNOSIS — Z6838 Body mass index (BMI) 38.0-38.9, adult: Secondary | ICD-10-CM

## 2011-11-23 HISTORY — DX: Presence of unspecified artificial knee joint: Z96.659

## 2011-11-23 HISTORY — PX: TOTAL KNEE ARTHROPLASTY: SHX125

## 2011-11-23 LAB — TYPE AND SCREEN: ABO/RH(D): O POS

## 2011-11-23 SURGERY — ARTHROPLASTY, KNEE, TOTAL
Anesthesia: Spinal | Site: Knee | Laterality: Right | Wound class: Clean

## 2011-11-23 MED ORDER — SERTRALINE HCL 100 MG PO TABS
100.0000 mg | ORAL_TABLET | Freq: Every morning | ORAL | Status: DC
  Administered 2011-11-24: 100 mg via ORAL
  Filled 2011-11-23 (×2): qty 1

## 2011-11-23 MED ORDER — MENTHOL 3 MG MT LOZG
1.0000 | LOZENGE | OROMUCOSAL | Status: DC | PRN
  Filled 2011-11-23 (×2): qty 9

## 2011-11-23 MED ORDER — ACETAMINOPHEN 10 MG/ML IV SOLN: 1000.0000 mg | Freq: Once | INTRAVENOUS | Status: DC | PRN

## 2011-11-23 MED ORDER — DIPHENHYDRAMINE HCL 25 MG PO CAPS
25.0000 mg | ORAL_CAPSULE | Freq: Four times a day (QID) | ORAL | Status: DC | PRN
  Administered 2011-11-24 – 2011-11-25 (×5): 25 mg via ORAL
  Filled 2011-11-23 (×5): qty 1

## 2011-11-23 MED ORDER — PROPOFOL INFUSION 10 MG/ML OPTIME
INTRAVENOUS | Status: DC | PRN
  Administered 2011-11-23: 50 ug/kg/min via INTRAVENOUS

## 2011-11-23 MED ORDER — MEPERIDINE HCL 50 MG/ML IJ SOLN: 6.2500 mg | INTRAMUSCULAR | Status: DC | PRN

## 2011-11-23 MED ORDER — OXYCODONE HCL 5 MG PO TABS: 5.0000 mg | ORAL_TABLET | Freq: Once | ORAL | Status: DC | PRN

## 2011-11-23 MED ORDER — HYDROMORPHONE HCL PF 1 MG/ML IJ SOLN
INTRAMUSCULAR | Status: AC
  Administered 2011-11-24: 1 mg via INTRAVENOUS
  Filled 2011-11-23: qty 2

## 2011-11-23 MED ORDER — FERROUS SULFATE 325 (65 FE) MG PO TABS
325.0000 mg | ORAL_TABLET | Freq: Three times a day (TID) | ORAL | Status: DC
  Administered 2011-11-23 – 2011-11-25 (×5): 325 mg via ORAL
  Filled 2011-11-23 (×8): qty 1

## 2011-11-23 MED ORDER — DEXAMETHASONE SODIUM PHOSPHATE 10 MG/ML IJ SOLN: 10.0000 mg | Freq: Once | INTRAMUSCULAR | Status: DC

## 2011-11-23 MED ORDER — PANTOPRAZOLE SODIUM 40 MG PO TBEC
80.0000 mg | DELAYED_RELEASE_TABLET | Freq: Every day | ORAL | Status: DC
  Administered 2011-11-24 – 2011-11-25 (×2): 80 mg via ORAL
  Filled 2011-11-23 (×3): qty 2

## 2011-11-23 MED ORDER — DOCUSATE SODIUM 100 MG PO CAPS
100.0000 mg | ORAL_CAPSULE | Freq: Two times a day (BID) | ORAL | Status: DC
  Administered 2011-11-23 – 2011-11-24 (×3): 100 mg via ORAL

## 2011-11-23 MED ORDER — BUPIVACAINE HCL 0.75 % IJ SOLN
INTRAMUSCULAR | Status: DC | PRN
  Administered 2011-11-23: 12 mg via INTRATHECAL

## 2011-11-23 MED ORDER — ONDANSETRON HCL 4 MG PO TABS: 4.0000 mg | ORAL_TABLET | Freq: Four times a day (QID) | ORAL | Status: DC | PRN

## 2011-11-23 MED ORDER — HYOSCYAMINE SULFATE ER 0.375 MG PO TB12
0.3750 mg | ORAL_TABLET | Freq: Two times a day (BID) | ORAL | Status: DC | PRN
  Filled 2011-11-23: qty 1

## 2011-11-23 MED ORDER — TRIAMTERENE-HCTZ 37.5-25 MG PO TABS
1.0000 | ORAL_TABLET | Freq: Every morning | ORAL | Status: DC
  Administered 2011-11-23 – 2011-11-25 (×3): 1 via ORAL
  Filled 2011-11-23 (×3): qty 1

## 2011-11-23 MED ORDER — BISACODYL 10 MG RE SUPP: 10.0000 mg | Freq: Every day | RECTAL | Status: DC | PRN

## 2011-11-23 MED ORDER — LACTATED RINGERS IV SOLN
INTRAVENOUS | Status: DC | PRN
  Administered 2011-11-23 (×2): via INTRAVENOUS

## 2011-11-23 MED ORDER — HYDROCODONE-ACETAMINOPHEN 7.5-325 MG PO TABS
1.0000 | ORAL_TABLET | ORAL | Status: DC
  Administered 2011-11-23 (×2): 2 via ORAL
  Administered 2011-11-23: 1 via ORAL
  Administered 2011-11-24 (×5): 2 via ORAL
  Administered 2011-11-24: 1 via ORAL
  Administered 2011-11-25 (×2): 2 via ORAL
  Administered 2011-11-25: 1 via ORAL
  Filled 2011-11-23: qty 2
  Filled 2011-11-23: qty 1
  Filled 2011-11-23 (×10): qty 2

## 2011-11-23 MED ORDER — HYDROMORPHONE HCL PF 1 MG/ML IJ SOLN
0.2500 mg | INTRAMUSCULAR | Status: DC | PRN
Start: 2011-11-23 — End: 2011-11-23
  Administered 2011-11-23: 0.5 mg via INTRAVENOUS

## 2011-11-23 MED ORDER — ALUM & MAG HYDROXIDE-SIMETH 200-200-20 MG/5ML PO SUSP: 30.0000 mL | ORAL | Status: DC | PRN

## 2011-11-23 MED ORDER — FENTANYL CITRATE 0.05 MG/ML IJ SOLN
INTRAMUSCULAR | Status: DC | PRN
  Administered 2011-11-23 (×7): 50 ug via INTRAVENOUS

## 2011-11-23 MED ORDER — FLEET ENEMA 7-19 GM/118ML RE ENEM: 1.0000 | ENEMA | Freq: Once | RECTAL | Status: AC | PRN

## 2011-11-23 MED ORDER — METHOCARBAMOL 500 MG PO TABS
500.0000 mg | ORAL_TABLET | Freq: Four times a day (QID) | ORAL | Status: DC | PRN
  Administered 2011-11-23 – 2011-11-25 (×4): 500 mg via ORAL
  Filled 2011-11-23 (×5): qty 1

## 2011-11-23 MED ORDER — CELECOXIB 200 MG PO CAPS
200.0000 mg | ORAL_CAPSULE | Freq: Two times a day (BID) | ORAL | Status: DC
  Administered 2011-11-23 – 2011-11-25 (×4): 200 mg via ORAL
  Filled 2011-11-23 (×5): qty 1

## 2011-11-23 MED ORDER — PROPOFOL 10 MG/ML IV BOLUS
INTRAVENOUS | Status: DC | PRN
  Administered 2011-11-23: 200 mg via INTRAVENOUS

## 2011-11-23 MED ORDER — KETOROLAC TROMETHAMINE 30 MG/ML IJ SOLN
INTRAMUSCULAR | Status: AC
  Filled 2011-11-23: qty 1

## 2011-11-23 MED ORDER — CEFAZOLIN SODIUM-DEXTROSE 2-3 GM-% IV SOLR
2.0000 g | Freq: Four times a day (QID) | INTRAVENOUS | Status: AC
  Administered 2011-11-23 – 2011-11-24 (×2): 2 g via INTRAVENOUS
  Filled 2011-11-23 (×2): qty 50

## 2011-11-23 MED ORDER — ONDANSETRON HCL 4 MG/2ML IJ SOLN
INTRAMUSCULAR | Status: DC | PRN
  Administered 2011-11-23: 4 mg via INTRAVENOUS

## 2011-11-23 MED ORDER — LACTATED RINGERS IV SOLN: INTRAVENOUS | Status: DC

## 2011-11-23 MED ORDER — PHENOL 1.4 % MT LIQD
1.0000 | OROMUCOSAL | Status: DC | PRN
  Filled 2011-11-23: qty 177

## 2011-11-23 MED ORDER — METOPROLOL SUCCINATE ER 25 MG PO TB24
25.0000 mg | ORAL_TABLET | Freq: Every morning | ORAL | Status: DC
  Administered 2011-11-23 – 2011-11-25 (×3): 25 mg via ORAL
  Filled 2011-11-23 (×3): qty 1

## 2011-11-23 MED ORDER — ACETAMINOPHEN 10 MG/ML IV SOLN
INTRAVENOUS | Status: AC
  Filled 2011-11-23: qty 100

## 2011-11-23 MED ORDER — TRANEXAMIC ACID 100 MG/ML IV SOLN
1580.0000 mg | Freq: Once | INTRAVENOUS | Status: AC
  Administered 2011-11-23: 1580 mg via INTRAVENOUS
  Filled 2011-11-23: qty 15.8

## 2011-11-23 MED ORDER — CEFAZOLIN SODIUM-DEXTROSE 2-3 GM-% IV SOLR
INTRAVENOUS | Status: AC
  Filled 2011-11-23: qty 50

## 2011-11-23 MED ORDER — SODIUM CHLORIDE 0.9 % IV SOLN
INTRAVENOUS | Status: DC
  Administered 2011-11-24 (×2): via INTRAVENOUS
  Filled 2011-11-23 (×13): qty 1000

## 2011-11-23 MED ORDER — METHOCARBAMOL 100 MG/ML IJ SOLN
500.0000 mg | Freq: Four times a day (QID) | INTRAVENOUS | Status: DC | PRN
  Administered 2011-11-23: 500 mg via INTRAVENOUS
  Filled 2011-11-23: qty 5

## 2011-11-23 MED ORDER — PROMETHAZINE HCL 25 MG/ML IJ SOLN: 6.2500 mg | INTRAMUSCULAR | Status: DC | PRN

## 2011-11-23 MED ORDER — KETOROLAC TROMETHAMINE 30 MG/ML IJ SOLN
INTRAMUSCULAR | Status: DC | PRN
  Administered 2011-11-23: 30 mg

## 2011-11-23 MED ORDER — MIDAZOLAM HCL 5 MG/5ML IJ SOLN
INTRAMUSCULAR | Status: DC | PRN
  Administered 2011-11-23: 2 mg via INTRAVENOUS

## 2011-11-23 MED ORDER — HYDROMORPHONE HCL PF 1 MG/ML IJ SOLN
0.5000 mg | INTRAMUSCULAR | Status: DC | PRN
  Administered 2011-11-23 – 2011-11-25 (×4): 1 mg via INTRAVENOUS
  Filled 2011-11-23 (×4): qty 1

## 2011-11-23 MED ORDER — RIVAROXABAN 10 MG PO TABS
10.0000 mg | ORAL_TABLET | ORAL | Status: DC
  Administered 2011-11-24 – 2011-11-25 (×2): 10 mg via ORAL
  Filled 2011-11-23 (×3): qty 1

## 2011-11-23 MED ORDER — ONDANSETRON HCL 4 MG/2ML IJ SOLN: 4.0000 mg | Freq: Four times a day (QID) | INTRAMUSCULAR | Status: DC | PRN

## 2011-11-23 MED ORDER — CEFAZOLIN SODIUM-DEXTROSE 2-3 GM-% IV SOLR
2.0000 g | INTRAVENOUS | Status: AC
  Administered 2011-11-23: 2 g via INTRAVENOUS

## 2011-11-23 MED ORDER — CHLORHEXIDINE GLUCONATE 4 % EX LIQD
60.0000 mL | Freq: Once | CUTANEOUS | Status: DC
  Filled 2011-11-23: qty 60

## 2011-11-23 MED ORDER — BUPIVACAINE-EPINEPHRINE 0.25% -1:200000 IJ SOLN
INTRAMUSCULAR | Status: AC
  Filled 2011-11-23: qty 1

## 2011-11-23 MED ORDER — OXYCODONE HCL 5 MG/5ML PO SOLN
5.0000 mg | Freq: Once | ORAL | Status: DC | PRN
  Filled 2011-11-23: qty 5

## 2011-11-23 MED ORDER — POLYETHYLENE GLYCOL 3350 17 G PO PACK
17.0000 g | PACK | Freq: Two times a day (BID) | ORAL | Status: DC
  Administered 2011-11-23 – 2011-11-24 (×3): 17 g via ORAL

## 2011-11-23 MED ORDER — ZOLPIDEM TARTRATE 5 MG PO TABS: 5.0000 mg | ORAL_TABLET | Freq: Every evening | ORAL | Status: DC | PRN

## 2011-11-23 MED ORDER — BUPIVACAINE-EPINEPHRINE PF 0.25-1:200000 % IJ SOLN
INTRAMUSCULAR | Status: DC | PRN
  Administered 2011-11-23: 50 mL

## 2011-11-23 MED ORDER — DEXAMETHASONE SODIUM PHOSPHATE 10 MG/ML IJ SOLN
10.0000 mg | Freq: Once | INTRAMUSCULAR | Status: AC
  Administered 2011-11-24: 10 mg via INTRAVENOUS
  Filled 2011-11-23: qty 1

## 2011-11-23 MED ORDER — 0.9 % SODIUM CHLORIDE (POUR BTL) OPTIME
TOPICAL | Status: DC | PRN
  Administered 2011-11-23: 1000 mL

## 2011-11-23 SURGICAL SUPPLY — 61 items
ADH SKN CLS APL DERMABOND .7 (GAUZE/BANDAGES/DRESSINGS) ×1
BAG SPEC THK2 15X12 ZIP CLS (MISCELLANEOUS) ×1
BAG ZIPLOCK 12X15 (MISCELLANEOUS) ×2 IMPLANT
BANDAGE ELASTIC 6 VELCRO ST LF (GAUZE/BANDAGES/DRESSINGS) ×2 IMPLANT
BANDAGE ESMARK 6X9 LF (GAUZE/BANDAGES/DRESSINGS) ×1 IMPLANT
BLADE SAW SGTL 13.0X1.19X90.0M (BLADE) ×2 IMPLANT
BNDG CMPR 9X6 STRL LF SNTH (GAUZE/BANDAGES/DRESSINGS) ×1
BNDG ESMARK 6X9 LF (GAUZE/BANDAGES/DRESSINGS) ×2
BOWL SMART MIX CTS (DISPOSABLE) ×2 IMPLANT
CEMENT HV SMART SET (Cement) ×2 IMPLANT
CLOTH BEACON ORANGE TIMEOUT ST (SAFETY) ×2 IMPLANT
CUFF TOURN SGL QUICK 34 (TOURNIQUET CUFF) ×2
CUFF TRNQT CYL 34X4X40X1 (TOURNIQUET CUFF) ×1 IMPLANT
DECANTER SPIKE VIAL GLASS SM (MISCELLANEOUS) ×2 IMPLANT
DERMABOND ADVANCED (GAUZE/BANDAGES/DRESSINGS) ×1
DERMABOND ADVANCED .7 DNX12 (GAUZE/BANDAGES/DRESSINGS) ×1 IMPLANT
DRAPE EXTREMITY T 121X128X90 (DRAPE) ×2 IMPLANT
DRAPE POUCH INSTRU U-SHP 10X18 (DRAPES) ×2 IMPLANT
DRAPE U-SHAPE 47X51 STRL (DRAPES) ×2 IMPLANT
DRSG AQUACEL AG ADV 3.5X10 (GAUZE/BANDAGES/DRESSINGS) ×2 IMPLANT
DRSG TEGADERM 4X4.75 (GAUZE/BANDAGES/DRESSINGS) ×2 IMPLANT
DURAPREP 26ML APPLICATOR (WOUND CARE) ×2 IMPLANT
ELECT REM PT RETURN 9FT ADLT (ELECTROSURGICAL) ×2
ELECTRODE REM PT RTRN 9FT ADLT (ELECTROSURGICAL) ×1 IMPLANT
EVACUATOR 1/8 PVC DRAIN (DRAIN) ×2 IMPLANT
FACESHIELD LNG OPTICON STERILE (SAFETY) ×10 IMPLANT
GAUZE SPONGE 2X2 8PLY STRL LF (GAUZE/BANDAGES/DRESSINGS) ×1 IMPLANT
GLOVE BIOGEL PI IND STRL 7.5 (GLOVE) ×1 IMPLANT
GLOVE BIOGEL PI IND STRL 8 (GLOVE) ×1 IMPLANT
GLOVE BIOGEL PI INDICATOR 7.5 (GLOVE) ×1
GLOVE BIOGEL PI INDICATOR 8 (GLOVE) ×1
GLOVE ECLIPSE 8.0 STRL XLNG CF (GLOVE) ×2 IMPLANT
GLOVE ORTHO TXT STRL SZ7.5 (GLOVE) ×4 IMPLANT
GLOVE SURG SS PI 6.5 STRL IVOR (GLOVE) ×4 IMPLANT
GOWN BRE IMP PREV XXLGXLNG (GOWN DISPOSABLE) ×3 IMPLANT
GOWN BRE IMP SLV SIRUS LXLNG (GOWN DISPOSABLE) ×2 IMPLANT
GOWN STRL NON-REIN LRG LVL3 (GOWN DISPOSABLE) ×2 IMPLANT
HANDPIECE INTERPULSE COAX TIP (DISPOSABLE) ×2
IMMOBILIZER KNEE 20 (SOFTGOODS) ×4
IMMOBILIZER KNEE 20 THIGH 36 (SOFTGOODS) IMPLANT
KIT BASIN OR (CUSTOM PROCEDURE TRAY) ×2 IMPLANT
MANIFOLD NEPTUNE II (INSTRUMENTS) ×2 IMPLANT
NDL SAFETY ECLIPSE 18X1.5 (NEEDLE) ×1 IMPLANT
NEEDLE HYPO 18GX1.5 SHARP (NEEDLE) ×2
NS IRRIG 1000ML POUR BTL (IV SOLUTION) ×3 IMPLANT
PACK TOTAL JOINT (CUSTOM PROCEDURE TRAY) ×2 IMPLANT
POSITIONER SURGICAL ARM (MISCELLANEOUS) ×2 IMPLANT
SET HNDPC FAN SPRY TIP SCT (DISPOSABLE) ×1 IMPLANT
SET PAD KNEE POSITIONER (MISCELLANEOUS) ×2 IMPLANT
SPONGE GAUZE 2X2 STER 10/PKG (GAUZE/BANDAGES/DRESSINGS) ×1
SUCTION FRAZIER 12FR DISP (SUCTIONS) ×2 IMPLANT
SUT MNCRL AB 4-0 PS2 18 (SUTURE) ×2 IMPLANT
SUT VIC AB 1 CT1 36 (SUTURE) ×5 IMPLANT
SUT VIC AB 2-0 CT1 27 (SUTURE) ×4
SUT VIC AB 2-0 CT1 TAPERPNT 27 (SUTURE) ×3 IMPLANT
SUT VLOC 180 0 24IN GS25 (SUTURE) ×1 IMPLANT
SYR 50ML LL SCALE MARK (SYRINGE) ×2 IMPLANT
TOWEL OR 17X26 10 PK STRL BLUE (TOWEL DISPOSABLE) ×4 IMPLANT
TRAY FOLEY CATH 14FRSI W/METER (CATHETERS) ×2 IMPLANT
WATER STERILE IRR 1500ML POUR (IV SOLUTION) ×3 IMPLANT
WRAP KNEE MAXI GEL POST OP (GAUZE/BANDAGES/DRESSINGS) ×2 IMPLANT

## 2011-11-23 NOTE — Evaluation (Signed)
Physical Therapy Evaluation Patient Details Name: [name] [name] MRN: [id number] DOB: [birth year] Today's Date: 11/23/2011 Time: 6213-0865 PT Time Calculation (min): 23 min  PT Assessment / Plan / Recommendation Clinical Impression  Pt s/p R TKR.  Pt would benefit from acute PT services in order to improve independence with bed mobility, transfers, ambulation and stairs by increasing R LE strength and ROM to prepare for d/c home with spouse.    PT Assessment  Patient needs continued PT services    Follow Up Recommendations  Home health PT    Barriers to Discharge        Equipment Recommendations  Toilet rise with handles (pt interested in toilet riser)    Recommendations for Other Services     Frequency 7X/week    Precautions / Restrictions Precautions Precautions: Knee Required Braces or Orthoses: Knee Immobilizer - Right Knee Immobilizer - Right: Discontinue once straight leg raise with < 10 degree lag Restrictions Weight Bearing Restrictions: No RLE Weight Bearing: Weight bearing as tolerated   Pertinent Vitals/Pain 3/10 R knee pain with ambulation, repositioned      Mobility  Bed Mobility Bed Mobility: Supine to Sit Supine to Sit: 4: Min assist;HOB elevated Details for Bed Mobility Assistance: verbal cues for technique, assist for R LE Transfers Transfers: Sit to Stand;Stand to Sit Sit to Stand: 4: Min guard;With upper extremity assist;From bed Stand to Sit: 4: Min guard;With upper extremity assist;To chair/3-in-1 Details for Transfer Assistance: verbal cues for safe technique Ambulation/Gait Ambulation/Gait Assistance: 4: Min guard Ambulation Distance (Feet): 60 Feet Assistive device: Rolling walker Ambulation/Gait Assistance Details: verbal cues for safe RW distance, step length, sequence Gait Pattern: Step-to pattern;Antalgic;Decreased stance time - right Gait velocity: decreased    Shoulder Instructions     Exercises     PT Diagnosis: Difficulty  walking;Acute pain  PT Problem List: Decreased strength;Decreased range of motion;Decreased mobility;Decreased safety awareness;Decreased activity tolerance;Decreased knowledge of use of DME;Pain PT Treatment Interventions: DME instruction;Gait training;Stair training;Functional mobility training;Therapeutic activities;Therapeutic exercise;Patient/family education   PT Goals Acute Rehab PT Goals PT Goal Formulation: With patient Time For Goal Achievement: 11/30/11 Potential to Achieve Goals: Good Pt will go Supine/Side to Sit: with modified independence PT Goal: Supine/Side to Sit - Progress: Goal set today Pt will go Sit to Supine/Side: with modified independence PT Goal: Sit to Supine/Side - Progress: Goal set today Pt will go Sit to Stand: with modified independence PT Goal: Sit to Stand - Progress: Goal set today Pt will go Stand to Sit: with modified independence PT Goal: Stand to Sit - Progress: Goal set today Pt will Ambulate: >150 feet;with supervision;with least restrictive assistive device PT Goal: Ambulate - Progress: Goal set today Pt will Go Up / Down Stairs: 1-2 stairs;with supervision;with least restrictive assistive device PT Goal: Up/Down Stairs - Progress: Goal set today Pt will Perform Home Exercise Program: with supervision, verbal cues required/provided PT Goal: Perform Home Exercise Program - Progress: Goal set today  Visit Information  Last PT Received On: 11/23/11 Assistance Needed: +1    Subjective Data  Subjective: Ok which one goes first again?  (after turning around ambulating)   Prior Functioning  Home Living Lives With: Spouse Type of Home: House Home Access: Stairs to enter Secretary/administrator of Steps: 1 Entrance Stairs-Rails: None Home Layout: One level Home Adaptive Equipment: Walker - rolling;Straight cane Prior Function Level of Independence: Independent Communication Communication: No difficulties    Cognition  Overall Cognitive  Status: Appears within functional limits for tasks assessed/performed  Arousal/Alertness: Awake/alert Orientation Level: Appears intact for tasks assessed Behavior During Session: [facility] for tasks performed    Extremity/Trunk Assessment Right Upper Extremity Assessment RUE ROM/Strength/Tone: [facility] for tasks assessed Left Upper Extremity Assessment LUE ROM/Strength/Tone: WFL for tasks assessed Right Lower Extremity Assessment RLE ROM/Strength/Tone: Deficits RLE ROM/Strength/Tone Deficits: unable to move against gravity with bed mobility, maintained KI Left Lower Extremity Assessment LLE ROM/Strength/Tone: WFL for tasks assessed   Balance    End of Session PT - End of Session Equipment Utilized During Treatment: Right knee immobilizer Activity Tolerance: Patient tolerated treatment well Patient left: in chair;with call bell/phone within reach  GP     Aastha Dayley,KATHrine E 11/23/2011, 4:12 PM Pager: [phone]

## 2011-11-23 NOTE — Anesthesia Preprocedure Evaluation (Addendum)
Anesthesia Evaluation  Patient identified by MRN, date of birth, ID band Patient awake    Reviewed: Allergy & Precautions, H&P , NPO status , Patient's Chart, lab work & pertinent test results, reviewed documented beta blocker date and time   Airway Mallampati: II TM Distance: >3 FB Neck ROM: Full    Dental  (+) Teeth Intact and Dental Advisory Given   Pulmonary neg pulmonary ROS,  breath sounds clear to auscultation  Pulmonary exam normal       Cardiovascular hypertension, Pt. on medications and Pt. on home beta blockers Rhythm:Regular Rate:Normal     Neuro/Psych PSYCHIATRIC DISORDERS Depression negative neurological ROS     GI/Hepatic Neg liver ROS, GERD-  Medicated and Controlled,  Endo/Other  Morbid obesity  Renal/GU negative Renal ROS     Musculoskeletal   Abdominal (+) + obese,   Peds  Hematology negative hematology ROS (+)   Anesthesia Other Findings   Reproductive/Obstetrics                         Anesthesia Physical Anesthesia Plan  ASA: III  Anesthesia Plan: Spinal   Post-op Pain Management:    Induction:   Airway Management Planned: Simple Face Mask  Additional Equipment:   Intra-op Plan:   Post-operative Plan:   Informed Consent: I have reviewed the patients History and Physical, chart, labs and discussed the procedure including the risks, benefits and alternatives for the proposed anesthesia with the patient or authorized representative who has indicated his/her understanding and acceptance.   Dental advisory given  Plan Discussed with: CRNA and Surgeon  Anesthesia Plan Comments:         Anesthesia Quick Evaluation

## 2011-11-23 NOTE — Interval H&P Note (Signed)
History and Physical Interval Note:  11/23/2011 7:33 AM  [name] [name]  has presented today for surgery, with the diagnosis of Right Knee Osteoarthritis  The various methods of treatment have been discussed with the patient and family. After consideration of risks, benefits and other options for treatment, the patient has consented to  Procedure(s) (LRB) with comments: TOTAL KNEE ARTHROPLASTY (Right) as a surgical intervention .  The patient's history has been reviewed, patient examined, no change in status, stable for surgery.  I have reviewed the patient's chart and labs.  Questions were answered to the patient's satisfaction.     Shelda Pal

## 2011-11-23 NOTE — Anesthesia Procedure Notes (Signed)
Spinal  Patient location during procedure: OR Start time: 11/23/2011 7:45 AM End time: 11/23/2011 7:50 AM Staffing CRNA/Resident: Paris Lore Performed by: resident/CRNA  Preanesthetic Checklist Completed: patient identified, site marked, surgical consent, pre-op evaluation, timeout performed, IV checked, risks and benefits discussed and monitors and equipment checked Spinal Block Patient position: sitting Prep: ChloraPrep and site prepped and draped Patient monitoring: heart rate, continuous pulse ox and blood pressure Approach: midline Location: L2-3 Injection technique: single-shot Needle Needle type: Spinocan  Needle gauge: 25 G Needle length: 9 cm Assessment Sensory level: T3 Additional Notes Expiration date of kit checked and confirmed. Patient tolerated procedure well, without complications. Area prepped spinal placed X 1 attempt, clear CSF return easy aspiration.  Noted T3 level.

## 2011-11-23 NOTE — H&P (View-Only) (Signed)
[name] [name], [name] [name]               ACCOUNT NO.:  [id number]  MEDICAL RECORD NO.:  [id number]  LOCATION:  [facility]                         FACILITY:  [facility]  PHYSICIAN:  Madlyn Frankel. Charlann Boxer, M.D.  DATE OF BIRTH:  [birth year]  DATE OF ADMISSION:  11/17/2011 DATE OF DISCHARGE:  11/17/2011                             HISTORY & PHYSICAL   DATE OF SURGERY:  November 23, 2011.  ADMITTING DIAGNOSIS:  End-stage osteoarthritis, right knee.  PROPOSED PROCEDURE:  Total knee arthroplasty, right knee.  HISTORY OF PRESENT ILLNESS:  This is a 51 year old lady with history of end-stage osteoarthritis of her right knee has failed conservative treatment.  After discussion of treatments, benefits, risks and options, the patient is now scheduled for total knee arthroplasty of the right knee.  Note that her medical doctor is Dr. Creola [name].  She plans on going home after surgery.  She is a candidate for tranexamic acid and dexamethasone and will receive both at surgery and she is given her home medicines of aspirin, Robaxin, iron, MiraLax, and Colace to take postoperatively.  PAST MEDICAL HISTORY:  Drug allergies to ACCUPRIL with swelling and hives.  CURRENT MEDICATIONS: 1. Zoloft 100 mg once a day. 2. Omeprazole 40 mg once a day. 3. Triamterene/hydrochlorothiazide 37.5/25 once a day.  MEDICAL ILLNESSES:  Anxiety, hypertension, reflux.  PREVIOUS SURGERIES:  Knee arthroscopy, cholecystectomy, C-section with tubal pregnancy.  FAMILY HISTORY:  Positive for bone cancer, hypertension, and kidney disease.  SOCIAL HISTORY:  The patient is married.  She works as a Neurosurgeon. She does not smoke and drinks once a month and again plans on going home after surgery.  REVIEW OF SYSTEMS:  CENTRAL NERVOUS SYSTEM:  Negative for headache, blurred vision, or dizziness.  PULMONARY:  Negative for shortness of breath, PND, orthopnea. CARDIOVASCULAR:  Negative for chest pain, palpitation.  GI:  Negative for ulcers,  hepatitis.  GU:  Negative for urinary tract difficulty.  MUSCULOSKELETAL:  Positive as in HPI.  PHYSICAL EXAMINATION:  VITAL SIGNS:  Blood pressure 139/92, pulse 76 and regular, respirations 14. HEENT:  Head normocephalic.  Nose patent.  Ears patent.  Pupils are equal, round, and reactive to light.  Throat without injection. NECK:  Supple without adenopathy.  Carotids 2+ without bruit. CHEST:  Clear to auscultation.  No rales or rhonchi.  Respirations 14. HEART:  Regular rate and rhythm at 76 beats per minute without murmur. ABDOMEN:  Soft.  Active bowel sounds.  No masses or organomegaly. NEUROLOGIC:  The patient is alert and oriented to time, place, and person.  Cranial nerves II-XII grossly intact. EXTREMITIES:  Shows a valgus deformity of the right knee, 3 degrees of hyperextension, further flexion to 125 degrees.  Sensation and circulation are intact.  ASSESSMENT:  End-stage osteoarthritis, right knee.  PLAN:  Total knee arthroplasty, right knee.     [name] [name]. [name] [name], P.A.   ______________________________ Madlyn Frankel Charlann Boxer, M.D.    SJC/MEDQ  D:  11/18/2011  T:  11/19/2011  Job:  [id number]

## 2011-11-23 NOTE — Op Note (Signed)
NAME:  [name] [name]                      MEDICAL RECORD NO.:  [id number]                             FACILITY:  [facility]      PHYSICIAN:  Madlyn Frankel. Charlann Boxer, M.D.  DATE OF BIRTH:  04/01/1960      DATE OF PROCEDURE:  11/23/2011                                     OPERATIVE REPORT         PREOPERATIVE DIAGNOSIS:  Right knee osteoarthritis.      POSTOPERATIVE DIAGNOSIS:  Right knee osteoarthritis.      FINDINGS:  The patient was noted to have complete loss of cartilage and   bone-on-bone arthritis with associated osteophytes in the lateral and patellofemoral compartments of   the knee with a significant synovitis and associated effusion.      PROCEDURE:  Right total knee replacement.      COMPONENTS USED:  DePuy rotating platform posterior stabilized knee   system, a size 4N femur, 3 tibia, 10 mm PS insert, and 35 patellar   button.      SURGEON:  Madlyn Frankel. Charlann Boxer, M.D.      ASSISTANT:  Lanney Gins, PA-C.      ANESTHESIA:  Spinal.      SPECIMENS:  None.      COMPLICATION:  None.      DRAINS:  One Hemovac.  EBL: <200cc      TOURNIQUET TIME:   Total Tourniquet Time Documented: Thigh (Right) - 38 minutes .      The patient was stable to the recovery room.      INDICATION FOR PROCEDURE:  [name] [name] is a 51 y.o. female patient of   mine.  The patient had been seen, evaluated, and treated conservatively in the   office with medication, activity modification, and injections.  The patient had   radiographic changes of bone-on-bone arthritis with endplate sclerosis and osteophytes noted.      The patient failed conservative measures including medication, injections, and activity modification, and at this point was ready for more definitive measures.   Based on the radiographic changes and failed conservative measures, the patient   decided to proceed with total knee replacement.  Risks of infection,   DVT, component failure, need for revision surgery, postop course, and     expectations were all   discussed and reviewed.  Consent was obtained for benefit of pain   relief.      PROCEDURE IN DETAIL:  The patient was brought to the operative theater.   Once adequate anesthesia, preoperative antibiotics, 2 gm of Ancef administered, the patient was positioned supine with the right thigh tourniquet placed.  The  right lower extremity was prepped and draped in sterile fashion.  A time-   out was performed identifying the patient, planned procedure, and   extremity.      The right lower extremity was placed in the [facility] leg holder.  The leg was   exsanguinated, tourniquet elevated to 250 mmHg.  A midline incision was   made followed by median parapatellar arthrotomy.  Following initial   exposure, attention was first directed to the patella.  Precut   measurement was noted to be 21 mm.  I resected down to 14 mm and used a   35 patellar button to restore patellar height as well as cover the cut   surface.      The lug holes were drilled and a metal shim was placed to protect the   patella from retractors and saw blades.      At this point, attention was now directed to the femur.  The femoral   canal was opened with a drill, irrigated to try to prevent fat emboli.  An   intramedullary rod was passed at 5 degrees valgus, 10 mm of bone was   resected off the distal femur.  Following this resection, the tibia was   subluxated anteriorly.  Using the extramedullary guide, 2 mm of bone was resected off   the proximal lateral tibia, the most affected side.  The extension gap felt tight with the 10mm spacer but I wanted to see if it opened up with further bone cuts and debridement of osteophytes.   As well as confirmed that the cut was perpendicular in the coronal plane, checking with an alignment rod.      Once this was done, I sized the femur to be a size 4 in the anterior-   posterior dimension, chose a narrow component based on medial and   lateral dimension.  The  size 4 rotation block was then pinned in   position anterior referenced using the C-clamp to set rotation.  The   anterior, posterior, and  chamfer cuts were made without difficulty nor   notching making certain that I was along the anterior cortex to help   with flexion gap stability.      The final box cut was made off the lateral aspect of distal femur.      At this point, the tibia was sized to be a size 3, the size 3 tray was   then pinned in position through the medial third of the tubercle,   drilled, and keel punched.  Trial reduction was now carried with a 4N femur,  3 tibia, a 10 mm insert, and the 35 patella botton.  The knee was brought to   extension, at this point I felt that she was not getting out fully.  Thus the trail femur was removed.  I revisited the distal femoral cut removing 2 additional millimeters of bone. I then redid the chamfer cuts and box cut. We retrialed and found that noe the came out to full extension with good flexion stability with the patella   tracking through the trochlea without application of pressure.  Given   all these findings, the trial components removed.  Final components were   opened and cement was mixed.  The knee was irrigated with normal saline   solution and pulse lavage.  The synovial lining was   then injected with 0.25% Marcaine with epinephrine and 1 cc of Toradol,   total of 61 cc.      The knee was irrigated.  Final implants were then cemented onto clean and   dried cut surfaces of bone with the knee brought to extension with a 10mm trial insert.      Once the cement had fully cured, the excess cement was removed   throughout the knee.  I confirmed I was satisfied with the range of   motion and stability, and the final 10 mm PS insert was chosen.  It  was   placed into the knee.      The tourniquet had been let down at 37 minutes.  No significant   hemostasis required.  The medium Hemovac drain was placed deep.  The   extensor  mechanism was then reapproximated using #1 Vicryl with the knee   in flexion.  The   remaining wound was closed with 2-0 Vicryl and running 4-0 Monocryl.   The knee was cleaned, dried, dressed sterilely using Dermabond and   Aquacel dressing.  Drain site dressed separately.  The patient was then   brought to recovery room in stable condition, tolerating the procedure   well.   Please note that Physician Assistant, Lanney Gins, was present for the entirety of the case, and was utilized for pre-operative positioning, peri-operative retractor management, general facilitation of the procedure.  He was also utilized for primary wound closure at the end of the case.              Madlyn Frankel Charlann Boxer, M.D.

## 2011-11-23 NOTE — Anesthesia Postprocedure Evaluation (Signed)
Anesthesia Post Note  Patient: [name] [name]  Procedure(s) Performed: Procedure(s) (LRB): TOTAL KNEE ARTHROPLASTY (Right)  Anesthesia type: Spinal converted to General  Patient location: PACU  Post pain: Pain level controlled  Post assessment: Post-op Vital signs reviewed  Last Vitals: BP 135/92  Pulse 54  Temp 36.4 C  Resp 14  SpO2 97%  Post vital signs: Reviewed  Level of consciousness: sedated  Complications: Incomplete spinal anesthetic. Converted to general with LMA. No other apparent anesthesia complications

## 2011-11-23 NOTE — Preoperative (Signed)
Beta Blockers   Reason not to administer Beta Blockers:Held due to sinus bradycardia for duration of case

## 2011-11-23 NOTE — Progress Notes (Signed)
Utilization review completed.  

## 2011-11-23 NOTE — Transfer of Care (Signed)
Immediate Anesthesia Transfer of Care Note  Patient: [name] [name]  Procedure(s) Performed: Procedure(s) (LRB): TOTAL KNEE ARTHROPLASTY (Right)  Patient Location: PACU  Anesthesia Type: General and Regional  Level of Consciousness: sedated, patient cooperative and responds to stimulaton  Airway & Oxygen Therapy: Patient Spontanous Breathing and Patient connected to face mask oxgen  Post-op Assessment: Report given to PACU RN and Post -op Vital signs reviewed and stable  Post vital signs: Reviewed and stable  Complications: No apparent anesthesia complications On release to PACU patient able to move both lower ext. But not able to lift off bed. Denied pain on assessment.

## 2011-11-24 DIAGNOSIS — E876 Hypokalemia: Secondary | ICD-10-CM

## 2011-11-24 DIAGNOSIS — E669 Obesity, unspecified: Secondary | ICD-10-CM

## 2011-11-24 DIAGNOSIS — D500 Iron deficiency anemia secondary to blood loss (chronic): Secondary | ICD-10-CM

## 2011-11-24 LAB — CBC
HCT: 34.2 % — ABNORMAL LOW (ref 36.0–46.0)
Hemoglobin: 11.4 g/dL — ABNORMAL LOW (ref 12.0–15.0)
RDW: 13.7 % (ref 11.5–15.5)
WBC: 8 10*3/uL (ref 4.0–10.5)

## 2011-11-24 LAB — BASIC METABOLIC PANEL
BUN: 12 mg/dL (ref 6–23)
Chloride: 100 mEq/L (ref 96–112)
GFR calc Af Amer: >90 mL/min (ref >90)
GFR calc non Af Amer: >90 mL/min (ref >90)
Potassium: 3.2 mEq/L — ABNORMAL LOW (ref 3.5–5.1)
Sodium: 135 mEq/L (ref 135–145)

## 2011-11-24 NOTE — Progress Notes (Signed)
CARE MANAGEMENT NOTE 11/24/2011  Patient:  [name] [name], [name] [name]   Account Number:  [id number]  Date Initiated:  11/24/2011  Documentation initiated by:  Colleen Can  Subjective/Objective Assessment:   DX  rt knee osteoarthritis; total knee replacemnt  Referral to [facility] services from doctor's office.     Action/Plan:   CM spoke with patient. Plans are for patient to return to her home in [place] where spouse will be caregiver. Choice offered. Pt is requesting [place].  Pt has RW and cane. Genevieve Norlander will arrange for 3n1.   Anticipated DC Date:  11/26/2011   Anticipated DC Plan:  HOME W HOME HEALTH SERVICES  In-house referral  NA      DC Planning Services  CM consult      [facility] Choice  HOME HEALTH  DURABLE MEDICAL EQUIPMENT   Choice offered to / List presented to:  C-1 Patient   DME arranged  NA      DME agency  NA     HH arranged  HH-2 PT      [facility] agency  [facility]   Status of service:  In process, will continue to follow

## 2011-11-24 NOTE — Evaluation (Signed)
Occupational Therapy Evaluation Patient Details Name: [name] [name] MRN: [id number] DOB: [birth year] Today's Date: 11/24/2011 Time: 0272-5366 OT Time Calculation (min): 19 min  OT Assessment / Plan / Recommendation Clinical Impression  Pt is s/p R TKA and displays decreased independence with self care tasks but is progressing well. Will benefit from skilled OT services while on acute to improve independence with these tasks.     OT Assessment  Patient needs continued OT Services    Follow Up Recommendations  No OT follow up;Supervision/Assistance - 24 hour    Barriers to Discharge      Equipment Recommendations  3 in 1 bedside comode    Recommendations for Other Services    Frequency  Min 2X/week    Precautions / Restrictions Precautions Precautions: Knee Required Braces or Orthoses: Knee Immobilizer - Right Knee Immobilizer - Right: Discontinue once straight leg raise with < 10 degree lag Restrictions Weight Bearing Restrictions: No RLE Weight Bearing: Weight bearing as tolerated        ADL  Eating/Feeding: Simulated;Independent Where Assessed - Eating/Feeding: Chair Grooming: Simulated;Set up Where Assessed - Grooming: Supported sitting Upper Body Bathing: Simulated;Chest;Right arm;Left arm;Abdomen;Set up Where Assessed - Upper Body Bathing: Unsupported sitting Lower Body Bathing: Simulated;Minimal assistance Where Assessed - Lower Body Bathing: Supported sit to stand Upper Body Dressing: Simulated;Set up Where Assessed - Upper Body Dressing: Unsupported sitting Lower Body Dressing: Simulated;Minimal assistance Where Assessed - Lower Body Dressing: Supported sit to stand Toilet Transfer: Performed;Min guard Acupuncturist: Raised toilet seat with arms (or 3-in-1 over toilet) Toileting - Clothing Manipulation and Hygiene: Simulated;Min guard Where Assessed - Engineer, mining and Hygiene: Sit to stand from 3-in-1 or toilet Tub/Shower  Transfer Method: Not assessed Equipment Used: Rolling walker ADL Comments: Spouse can assist with LB ADL at discharge. Pt asking about difference between riser and 3in1. Explained the difference and she prefers 3in1. She also will plan to sponge bathe initially and is not interested in a tub seat right now.     OT Diagnosis: Generalized weakness  OT Problem List: Decreased strength;Decreased knowledge of use of DME or AE;Pain OT Treatment Interventions: Self-care/ADL training;Therapeutic activities;DME and/or AE instruction;Patient/family education   OT Goals Acute Rehab OT Goals OT Goal Formulation: With patient Time For Goal Achievement: 12/01/11 Potential to Achieve Goals: Good ADL Goals Pt Will Perform Grooming: with supervision;Standing at sink ADL Goal: Grooming - Progress: Goal set today Pt Will Transfer to Toilet: with supervision;Ambulation;with DME;3-in-1 ADL Goal: Toilet Transfer - Progress: Goal set today Pt Will Perform Toileting - Clothing Manipulation: with supervision;Standing ADL Goal: Toileting - Clothing Manipulation - Progress: Goal set today Pt Will Perform Toileting - Hygiene: with supervision;Sit to stand from 3-in-1/toilet ADL Goal: Toileting - Hygiene - Progress: Goal set today  Visit Information  Last OT Received On: 11/24/11 Assistance Needed: +1    Subjective Data  Subjective: What's the difference in a riser and that? referring to 3in1 Patient Stated Goal: none stated. agreeable to work with OT on toilet transfer   Prior Functioning     Home Living Lives With: Spouse Type of Home: House Home Access: Stairs to enter Secretary/administrator of Steps: 1 Entrance Stairs-Rails: None Home Layout: One level Bathroom Shower/Tub: Engineer, manufacturing systems: Standard Home Adaptive Equipment: Environmental consultant - rolling;Straight cane Prior Function Level of Independence: Independent Communication Communication: No difficulties           Vision/Perception     Cognition  Overall Cognitive Status: Appears within functional limits for  tasks assessed/performed Arousal/Alertness: Awake/alert Orientation Level: Appears intact for tasks assessed Behavior During Session: [facility] for tasks performed    Extremity/Trunk Assessment Right Upper Extremity Assessment RUE ROM/Strength/Tone: [facility] for tasks assessed Left Upper Extremity Assessment LUE ROM/Strength/Tone: WFL for tasks assessed     Mobility Transfers Transfers: Sit to Stand;Stand to Sit Sit to Stand: 4: Min guard;With upper extremity assist;From chair/3-in-1 Stand to Sit: 4: Min guard;With upper extremity assist;To chair/3-in-1 Details for Transfer Assistance: min verbal cues for safety     Shoulder Instructions     Exercise     Balance Balance Balance Assessed: Yes Dynamic Standing Balance Dynamic Standing - Level of Assistance: 5: Stand by assistance   End of Session OT - End of Session Activity Tolerance: Patient tolerated treatment well Patient left: in chair;with call bell/phone within reach  GO     Lennox Laity [phone] 11/24/2011, 9:44 AM

## 2011-11-24 NOTE — Progress Notes (Signed)
    Subjective: 1 Day Post-Op Procedure(s) (LRB): TOTAL KNEE ARTHROPLASTY (Right)   Patient reports pain as mild, pain well controlled. No events throughout the night.   Objective:   VITALS:   Filed Vitals:   11/24/11 0800  BP: 127/82  Pulse: 53  Temp: 98.1 F (36.7 C)   Resp: 18    Neurovascular intact Dorsiflexion/Plantar flexion intact Incision: dressing C/D/I No cellulitis present Compartment soft  LABS  Basename 11/24/11 0345  HGB 11.4*  HCT 34.2*  WBC 8.0  PLT 232     Basename 11/24/11 0345  NA 135  K 3.2*  BUN 12  CREATININE 0.78  GLUCOSE 109*     Assessment/Plan: 1 Day Post-Op Procedure(s) (LRB): TOTAL KNEE ARTHROPLASTY (Right) Foley cath d/c'ed HV drain d/c'ed Advance diet Up with therapy D/C IV fluids Discharge home with home health to home, if continues to do well   Expected ABLA  Treated with iron and will observe  Obese (BMI 30-39.9)  Estimated Body mass index is 38.27 kg/(m^2) as calculated from the following:   Height as of this encounter: 5\' 5" (1.651 m).   Weight as of this encounter: 230 lb(104.327 kg). Patient also counseled that weight may inhibit the healing process Patient counseled that losing weight will help with future health issues  Hypokalemia Treated with oral potassium and will observe     Anastasio Auerbach. Kyrianna Barletta   PAC  11/24/2011, 11:07 AM

## 2011-11-24 NOTE — Progress Notes (Signed)
Physical Therapy Treatment Patient Details Name: [name] [name] MRN: [id number] DOB: Aug 31, 1960 Today's Date: 11/24/2011 Time: 4098-1191 PT Time Calculation (min): 33 min  PT Assessment / Plan / Recommendation Comments on Treatment Session  Pt performed exercises and then ambulated again in hallway.    Follow Up Recommendations  Home health PT    Barriers to Discharge        Equipment Recommendations  3 in 1 bedside comode    Recommendations for Other Services    Frequency     Plan Discharge plan remains appropriate;Frequency remains appropriate    Precautions / Restrictions Precautions Precautions: Knee Required Braces or Orthoses: Knee Immobilizer - Right Knee Immobilizer - Right: Discontinue once straight leg raise with < 10 degree lag Restrictions RLE Weight Bearing: Weight bearing as tolerated   Pertinent Vitals/Pain No pain at rest, premedicated, repositioned    Mobility  Bed Mobility Bed Mobility: Supine to Sit;Sit to Supine Supine to Sit: 5: Supervision;HOB elevated Sit to Supine: 5: Supervision;HOB elevated Details for Bed Mobility Assistance: pt used UEs to assist R LE onto/off of bed Transfers Transfers: Sit to Stand;Stand to Sit Sit to Stand: 4: Min guard;From bed;With upper extremity assist Stand to Sit: 4: Min guard;With upper extremity assist;To bed Details for Transfer Assistance: verbal cues for safe technique Ambulation/Gait Ambulation/Gait Assistance: 4: Min guard;5: Supervision Ambulation Distance (Feet): 80 Feet Assistive device: Rolling walker Ambulation/Gait Assistance Details: verbal cue for step length and RW distance Gait Pattern: Step-to pattern;Antalgic;Decreased stance time - right Gait velocity: decreased    Exercises Total Joint Exercises Ankle Circles/Pumps: AROM;Both;20 reps Quad Sets: AROM;Both;20 reps Gluteal Sets: AROM;Both;20 reps Towel Squeeze: AROM;Both;15 reps Short Arc Quad: AROM;Strengthening;Right;15 reps Heel Slides:  AAROM;Right;15 reps Hip ABduction/ADduction: AAROM;Right;15 reps Straight Leg Raises: AAROM;Right;10 reps Goniometric ROM: R knee AAROM -3-90*   PT Diagnosis:    PT Problem List:   PT Treatment Interventions:     PT Goals Acute Rehab PT Goals PT Goal: Supine/Side to Sit - Progress: Progressing toward goal PT Goal: Sit to Supine/Side - Progress: Progressing toward goal PT Goal: Sit to Stand - Progress: Progressing toward goal PT Goal: Stand to Sit - Progress: Progressing toward goal PT Goal: Ambulate - Progress: Progressing toward goal PT Goal: Perform Home Exercise Program - Progress: Progressing toward goal  Visit Information  Last PT Received On: 11/24/11 Assistance Needed: +1    Subjective Data  Subjective: I'm feeling a little loopy because I just got an IV shot.   Cognition  Overall Cognitive Status: Appears within functional limits for tasks assessed/performed    Balance     End of Session PT - End of Session Equipment Utilized During Treatment: Right knee immobilizer Activity Tolerance: Patient tolerated treatment well Patient left: in bed;with call bell/phone within reach   GP     Kela Baccari,KATHrine E 11/24/2011, 4:32 PM Pager: [phone]

## 2011-11-24 NOTE — Progress Notes (Signed)
Physical Therapy Treatment Patient Details Name: [name] [name] MRN: [id number] DOB: 25-Jun-1960 Today's Date: 11/24/2011 Time: 4098-1191 PT Time Calculation (min): 23 min  PT Assessment / Plan / Recommendation Comments on Treatment Session  Pt ambulated in hallway and takes increased time but moving well.  Pt left with OT  and ice packs brought to room for pain control once finished with OT.    Follow Up Recommendations  Home health PT    Barriers to Discharge        Equipment Recommendations  3 in 1 bedside comode    Recommendations for Other Services    Frequency     Plan Discharge plan remains appropriate;Frequency remains appropriate    Precautions / Restrictions Precautions Precautions: Knee Required Braces or Orthoses: Knee Immobilizer - Right Knee Immobilizer - Right: Discontinue once straight leg raise with < 10 degree lag Restrictions Weight Bearing Restrictions: No RLE Weight Bearing: Weight bearing as tolerated   Pertinent Vitals/Pain 5/10 R knee pain with gait, left with OT for session however prepared ice packs to apply, pt premedicated    Mobility  Bed Mobility Bed Mobility: Supine to Sit Supine to Sit: 4: Min assist;HOB elevated Details for Bed Mobility Assistance: verbal cues for technique, assist for R LE Transfers Transfers: Sit to Stand;Stand to Sit Sit to Stand: 4: Min guard Stand to Sit: 4: Min guard Details for Transfer Assistance: verbal cues for safe technique Ambulation/Gait Ambulation/Gait Assistance: 4: Min guard;5: Supervision Ambulation Distance (Feet): 100 Feet Assistive device: Rolling walker Ambulation/Gait Assistance Details: verbal cues for posture, pt able to recall correct sequence Gait Pattern: Step-to pattern;Antalgic;Decreased stance time - right Gait velocity: decreased General Gait Details: increased time for gait    Exercises     PT Diagnosis:    PT Problem List:   PT Treatment Interventions:     PT Goals Acute Rehab  PT Goals PT Goal: Supine/Side to Sit - Progress: Progressing toward goal PT Goal: Sit to Stand - Progress: Progressing toward goal PT Goal: Stand to Sit - Progress: Progressing toward goal PT Goal: Ambulate - Progress: Progressing toward goal  Visit Information  Last PT Received On: 11/24/11 Assistance Needed: +1    Subjective Data  Subjective: It hurts more than yesterday.   Cognition  Overall Cognitive Status: Appears within functional limits for tasks assessed/performed Arousal/Alertness: Awake/alert Orientation Level: Appears intact for tasks assessed Behavior During Session: [facility] for tasks performed    Balance  Balance Balance Assessed: Yes Dynamic Standing Balance Dynamic Standing - Level of Assistance: 5: Stand by assistance  End of Session PT - End of Session Equipment Utilized During Treatment: Right knee immobilizer Activity Tolerance: Patient tolerated treatment well Patient left: Other (comment) (OT into room to start session)   GP     Deidrick Rainey,KATHrine E 11/24/2011, 10:57 AM Pager: [phone]

## 2011-11-25 LAB — BASIC METABOLIC PANEL
CO2: 26 mEq/L (ref 19–32)
GFR calc non Af Amer: >90 mL/min (ref >90)
Glucose, Bld: 107 mg/dL — ABNORMAL HIGH (ref 70–99)
Potassium: 3.6 mEq/L (ref 3.5–5.1)
Sodium: 136 mEq/L (ref 135–145)

## 2011-11-25 LAB — CBC
Hemoglobin: 10.7 g/dL — ABNORMAL LOW (ref 12.0–15.0)
MCHC: 33.5 g/dL (ref 30.0–36.0)
Platelets: 223 10*3/uL (ref 150–400)
RBC: 3.5 MIL/uL — ABNORMAL LOW (ref 3.87–5.11)

## 2011-11-25 MED ORDER — DSS 100 MG PO CAPS: 100.0000 mg | ORAL_CAPSULE | Freq: Two times a day (BID) | ORAL | Status: DC

## 2011-11-25 MED ORDER — FERROUS SULFATE 325 (65 FE) MG PO TABS: 325.0000 mg | ORAL_TABLET | Freq: Three times a day (TID) | ORAL | Status: DC

## 2011-11-25 MED ORDER — HYDROCODONE-ACETAMINOPHEN 7.5-325 MG PO TABS: 1.0000 | ORAL_TABLET | ORAL | Status: DC | PRN

## 2011-11-25 MED ORDER — DIPHENHYDRAMINE HCL 25 MG PO CAPS: 25.0000 mg | ORAL_CAPSULE | Freq: Four times a day (QID) | ORAL | Status: DC | PRN

## 2011-11-25 MED ORDER — ASPIRIN EC 325 MG PO TBEC: 325.0000 mg | DELAYED_RELEASE_TABLET | Freq: Two times a day (BID) | ORAL | Status: DC

## 2011-11-25 MED ORDER — METHOCARBAMOL 500 MG PO TABS: 500.0000 mg | ORAL_TABLET | Freq: Four times a day (QID) | ORAL | Status: DC | PRN

## 2011-11-25 MED ORDER — POLYETHYLENE GLYCOL 3350 17 G PO PACK: 17.0000 g | PACK | Freq: Two times a day (BID) | ORAL | Status: DC

## 2011-11-25 NOTE — Progress Notes (Signed)
   Subjective: 2 Days Post-Op Procedure(s) (LRB): TOTAL KNEE ARTHROPLASTY (Right)   Patient reports pain as mild, pain well controlled. No events throughout the night. Ready to be discharged home.   Objective:   VITALS:   Filed Vitals:   11/25/11 0545  BP: 117/70  Pulse: 48  Temp: 97.7 F (36.5 C)  Resp: 14    Neurovascular intact Dorsiflexion/Plantar flexion intact Incision: dressing C/D/I No cellulitis present Compartment soft  LABS  Basename 11/25/11 0355 11/24/11 0345  HGB 10.7* 11.4*  HCT 31.9* 34.2*  WBC 11.4* 8.0  PLT 223 232     Basename 11/25/11 0355 11/24/11 0345  NA 136 135  K 3.6 3.2*  BUN 18 12  CREATININE 0.75 0.78  GLUCOSE 107* 109*     Assessment/Plan: 2 Days Post-Op Procedure(s) (LRB): TOTAL KNEE ARTHROPLASTY (Right) Up with therapy Discharge home with home health Follow up in 2 weeks at [facility]. Follow-up Information    Follow up with OLIN,Icess Bertoni D in 2 weeks.   Contact information:   [facility] [address] [place] [address] [phone]           Expected ABLA  Treated with iron and will observe    Obese (BMI 30-39.9)  Estimated Body mass index is 38.27 kg/(m^2) as calculated from the following:  Height as of this encounter: 5\' 5" (1.651 m).  Weight as of this encounter: 230 lb(104.327 kg).  Patient also counseled that weight may inhibit the healing process  Patient counseled that losing weight will help with future health issues     [name] [name]. [name] [name]   PAC  11/25/2011, 8:51 AM

## 2011-11-25 NOTE — Progress Notes (Signed)
Occupational Therapy Note Note plan for discharge today. Pt states she doesn't feel she needs to practice any ADL further. States she feels comfortable with toilet transfer with 3in1. Discussed tub transfer and if she desires a tubbench but she states she will sponge bathe until she is able to step over tub.  Judithann Sauger OTR/L [phone] 11/25/2011

## 2011-11-25 NOTE — Progress Notes (Signed)
Physical Therapy Treatment Note   11/25/11 1500  PT Visit Information  Last PT Received On 11/25/11  Assistance Needed +1  PT Time Calculation  PT Start Time 1344  PT Stop Time 1411  PT Time Calculation (min) 27 min  Subjective Data  Subjective pt ready to d/c after therapy  Precautions  Precautions Knee  Required Braces or Orthoses Knee Immobilizer - Right  Knee Immobilizer - Right Discontinue once straight leg raise with < 10 degree lag  Restrictions  RLE Weight Bearing WBAT  Cognition  Overall Cognitive Status Appears within functional limits for tasks assessed/performed  Bed Mobility  Bed Mobility Supine to Sit;Sit to Supine  Supine to Sit 5: Supervision;HOB elevated  Sit to Supine 5: Supervision;HOB elevated  Details for Bed Mobility Assistance pt used UEs to assist R LE onto/off of bed  Transfers  Transfers Sit to Stand;Stand to Sit  Sit to Stand 5: Supervision;With upper extremity assist;From bed  Stand to Sit 5: Supervision;With upper extremity assist;To bed  Details for Transfer Assistance spouse present and observed transfers, no cues needed  Ambulation/Gait  Ambulation/Gait Assistance 5: Supervision  Ambulation Distance (Feet) 80 Feet  Assistive device Rolling walker  Ambulation/Gait Assistance Details verbal cue for posture and RW distance, continues to have decreased speed  Gait Pattern Step-to pattern;Antalgic;Decreased stance time - right  Gait velocity decreased  Total Joint Exercises  Ankle Circles/Pumps AROM;Both;20 reps  Quad Sets AROM;Both;20 reps  Gluteal Sets AROM;Both;20 reps  Short Arc Quad Strengthening;Right;15 reps;AAROM  Heel Slides AAROM;Right;15 reps  Hip ABduction/ADduction AAROM;Right;15 reps  Straight Leg Raises AAROM;Right;10 reps  PT - End of Session  Activity Tolerance Patient tolerated treatment well  Patient left in bed;with call bell/phone within reach;with family/visitor present  PT - Assessment/Plan  Comments on Treatment  Session Pt ambulated without KI and reported knee felt like it would buckle however none observed.  Pt educated to maintain KI for d/c home and until HHPT instructs otherwise.  Pt ambulated and performed exercises.  Pt feels ready for d/c home with spouse.  PT Plan Discharge plan remains appropriate;Frequency remains appropriate  Follow Up Recommendations Home health PT  Equipment Recommended Other (comment) (bedside commode delivered in room)  Acute Rehab PT Goals  PT Goal: Supine/Side to Sit - Progress Progressing toward goal  PT Goal: Sit to Supine/Side - Progress Progressing toward goal  PT Goal: Sit to Stand - Progress Progressing toward goal  PT Goal: Stand to Sit - Progress Progressing toward goal  PT Goal: Ambulate - Progress Progressing toward goal  PT Goal: Perform Home Exercise Program - Progress Progressing toward goal  PT General Charges  $$ ACUTE PT VISIT 1 Procedure  PT Treatments  $Gait Training 8-22 mins  $Therapeutic Exercise 8-22 mins    Zenovia Jarred, PT Pager: [phone]

## 2011-11-25 NOTE — Progress Notes (Signed)
Physical Therapy Treatment Patient Details Name: [name] [name] MRN: [id number] DOB: 1961/02/08 Today's Date: 11/25/2011 Time: 4098-1191 PT Time Calculation (min): 18 min  PT Assessment / Plan / Recommendation Comments on Treatment Session  Pt reports d/c home today after 2nd therapy session.  Pt ambulated in hallway and performed step in preparation for home.    Follow Up Recommendations  Home health PT    Barriers to Discharge        Equipment Recommendations   (bedside commode delivered in room)    Recommendations for Other Services    Frequency     Plan Discharge plan remains appropriate;Frequency remains appropriate    Precautions / Restrictions Precautions Precautions: Knee Required Braces or Orthoses: Knee Immobilizer - Right Knee Immobilizer - Right: Discontinue once straight leg raise with < 10 degree lag Restrictions RLE Weight Bearing: Weight bearing as tolerated   Pertinent Vitals/Pain 5/10 R knee pain, ice packs applied, premedicated    Mobility  Bed Mobility Bed Mobility: Supine to Sit;Sit to Supine Supine to Sit: 5: Supervision;HOB elevated Sit to Supine: 5: Supervision;HOB elevated Details for Bed Mobility Assistance: pt used UEs to assist R LE onto/off of bed Transfers Transfers: Sit to Stand;Stand to Sit Sit to Stand: 5: Supervision;With upper extremity assist;From bed Stand to Sit: 5: Supervision;With upper extremity assist;To bed Details for Transfer Assistance: doing well with safe technique, no cues today Ambulation/Gait Ambulation/Gait Assistance: 5: Supervision Ambulation Distance (Feet): 140 Feet Assistive device: Rolling walker Ambulation/Gait Assistance Details: verbal cue for posture, RW distance Gait Pattern: Step-to pattern;Antalgic;Decreased stance time - right Gait velocity: decreased Stairs: Yes Stairs Assistance: 4: Min guard Stairs Assistance Details (indicate cue type and reason): performed twice, once with verbal cues for  technique and safety and then pt demonstrated correct technique without cues for 2nd time Stair Management Technique: Step to pattern;Forwards;With walker Number of Stairs: 1     Exercises     PT Diagnosis:    PT Problem List:   PT Treatment Interventions:     PT Goals Acute Rehab PT Goals PT Goal: Supine/Side to Sit - Progress: Progressing toward goal PT Goal: Sit to Supine/Side - Progress: Progressing toward goal PT Goal: Sit to Stand - Progress: Progressing toward goal PT Goal: Stand to Sit - Progress: Progressing toward goal PT Goal: Ambulate - Progress: Progressing toward goal PT Goal: Up/Down Stairs - Progress: Progressing toward goal  Visit Information  Last PT Received On: 11/25/11 Assistance Needed: +1    Subjective Data  Subjective: My husband brought my walker. Can you adjust the height?   Cognition  Overall Cognitive Status: Appears within functional limits for tasks assessed/performed    Balance     End of Session PT - End of Session Equipment Utilized During Treatment: Right knee immobilizer Activity Tolerance: Patient tolerated treatment well Patient left: in bed;with call bell/phone within reach   GP     [facility] E 11/25/2011, 10:44 AM Pager: [phone]

## 2011-11-26 NOTE — Care Management Note (Signed)
    Page 1 of 2   11/26/2011     1:23:50 PM   CARE MANAGEMENT NOTE 11/26/2011  Patient:  [name] [name], [name] [name]   Account Number:  [id number]  Date Initiated:  11/24/2011  Documentation initiated by:  Colleen Can  Subjective/Objective Assessment:   DX  rt knee osteoarthritis; total knee replacemnt  Referral to [facility] for [facility] services from doctor's office.     Action/Plan:   CM spoke with patient. Plans are for patient to return to her home in [place] where spouse will be caregiver. Choice offered. Pt is requesting [place].  Pt has RW and cane. Genevieve Norlander will arrange for 3n1.   Anticipated DC Date:  11/26/2011   Anticipated DC Plan:  HOME W HOME HEALTH SERVICES  In-house referral  NA      DC Planning Services  CM consult      [facility] Choice  HOME HEALTH  DURABLE MEDICAL EQUIPMENT   Choice offered to / List presented to:  C-1 Patient   DME arranged  NA      DME agency  NA     HH arranged  HH-2 PT      [facility] agency  [facility]   Status of service:  Completed, signed off Medicare Important Message given?  NO (If response is "NO", the following Medicare IM given date fields will be blank) Date Medicare IM given:   Date Additional Medicare IM given:    Discharge Disposition:  HOME W HOME HEALTH SERVICES  Per UR Regulation:    If discussed at Long Length of Stay Meetings, dates discussed:    Comments:  11/26/2011 Raynelle Bring BSN CCM [phone] Pt discharged 11/25/2011. HH services will start today. [facility] aware of discharge.

## 2011-11-27 NOTE — Discharge Summary (Signed)
Physician Discharge Summary  Patient ID: [name] [name] MRN: [id number] DOB/AGE: 52/05/1960 52 y.o.  Admit date: 11/23/2011 Discharge date: 11/25/2011   Procedures:  Procedure(s) (LRB): TOTAL KNEE ARTHROPLASTY (Right)  Attending Physician:  Dr. Durene Romans   Admission Diagnoses:   End-stage osteoarthritis, right knee  Discharge Diagnoses:  Principal Problem:  *S/P right TKA Active Problems:  Expected blood loss anemia  Obese  Hypokalemia Anxiety Hypertension Reflux  HPI: This is a 51 year old lady with history of end-stage osteoarthritis of her right knee has failed conservative treatment. After discussion of treatments, benefits, risks and options, the patient is now scheduled for total knee arthroplasty of the right knee. Note that her medical doctor is Dr. Creola Corn. She plans on going home after surgery. She is a candidate for tranexamic acid and dexamethasone and will receive both at surgery and she is given her home medicines of aspirin, Robaxin, iron, MiraLax, and Colace to take postoperatively.  PCP: [name], MD   Discharged Condition: good  Hospital Course:  Patient underwent the above stated procedure on 11/23/2011. Patient tolerated the procedure well and brought to the recovery room in good condition and subsequently to the floor.  POD #1 BP: 127/82 ; Pulse: 53 ; Temp: 98.1 F (36.7 C) ; Resp: 18 Pt's foley was removed, as well as the hemovac drain removed. IV was changed to a saline lock. Patient reports pain as mild, pain well controlled. No events throughout the night. Neurovascular intact, dorsiflexion/plantar flexion intact, incision: dressing C/D/I, no cellulitis present and compartment soft.   LABS  Basename  11/24/11 0345  HGB  11.4  HCT  34.2   POD #2  BP: 117/70 ; Pulse: 48 ; Temp: 97.7 F (36.5 C) ; Resp: 14  Patient reports pain as mild, pain well controlled. No events throughout the night. Ready to be discharged home.  Neurovascular  intact, dorsiflexion/plantar flexion intact, incision: dressing C/D/I, no cellulitis present and compartment soft.   LABS  Basename  11/25/11 0355   HGB  10.7  HCT  31.9    Discharge Exam: General appearance: alert, cooperative and no distress Extremities: Homans sign is negative, no sign of DVT, no edema, redness or tenderness in the calves or thighs and no ulcers, gangrene or trophic changes  Disposition: Home-Health Care Svc with follow up in 2 weeks   Follow-up Information    Follow up with Shelda Pal, MD. In 2 weeks.   Contact information:   [facility] [address] [place] [address] [phone]          Discharge Orders    Future Orders Please Complete By Expires   Diet - low sodium heart healthy      Call MD / Call 911      Comments:   If you experience chest pain or shortness of breath, CALL 911 and be transported to the hospital emergency room.  If you develope a fever above 101 F, pus (white drainage) or increased drainage or redness at the wound, or calf pain, call your surgeon's office.   Discharge instructions      Comments:   Maintain surgical dressing for 8 days, then replace with gauze and tape. Keep the area dry and clean until follow up. Follow up in 2 weeks at [facility]. Call with any questions or concerns.   Constipation Prevention      Comments:   Drink plenty of fluids.  Prune juice may be helpful.  You  may use a stool softener, such as Colace (over the counter) 100 mg twice a day.  Use MiraLax (over the counter) for constipation as needed.   Increase activity slowly as tolerated      Driving restrictions      Comments:   No driving for 4 weeks   TED hose      Comments:   Use stockings (TED hose) for 2 weeks on both leg(s).  You may remove them at night for sleeping.   Change dressing      Comments:   Maintain surgical dressing for 8 days, then change the dressing daily with sterile 4 x 4 inch  gauze dressing and tape. Keep the area dry and clean.      Discharge Medication List as of 11/25/2011 12:11 PM    START taking these medications   Details  aspirin EC 325 MG tablet Take 1 tablet (325 mg total) by mouth 2 (two) times daily. X 4 weeks, Starting 11/25/2011, Until Discontinued, No Print    diphenhydrAMINE (BENADRYL) 25 mg capsule Take 1 capsule (25 mg total) by mouth every 6 (six) hours as needed for itching, allergies or sleep., Starting 11/25/2011, Until Discontinued, No Print    docusate sodium 100 MG CAPS Take 100 mg by mouth 2 (two) times daily., Starting 11/25/2011, Until Discontinued, No Print    ferrous sulfate 325 (65 FE) MG tablet Take 1 tablet (325 mg total) by mouth 3 (three) times daily after meals., Starting 11/25/2011, Until Discontinued, No Print    HYDROcodone-acetaminophen (NORCO) 7.5-325 MG per tablet Take 1-2 tablets by mouth every 4 (four) hours as needed for pain., Starting 11/25/2011, Until Discontinued, Print    methocarbamol (ROBAXIN) 500 MG tablet Take 1 tablet (500 mg total) by mouth every 6 (six) hours as needed (muscle spasms)., Starting 11/25/2011, Until Discontinued, No Print    polyethylene glycol (MIRALAX / GLYCOLAX) packet Take 17 g by mouth 2 (two) times daily., Starting 11/25/2011, Until Discontinued, No Print      CONTINUE these medications which have NOT CHANGED   Details  hyoscyamine (LEVBID) 0.375 MG 12 hr tablet Take 1 tablet (0.375 mg total) by mouth every 12 (twelve) hours as needed for cramping., Starting 03/13/2011, Until Sat 03/12/12, Normal    metoprolol succinate (TOPROL-XL) 25 MG 24 hr tablet Take 25 mg by mouth every morning. Takes one tablet by mouth once daily, Starting 02/15/2011, Until Discontinued, Historical Med    Multiple Vitamins-Minerals (WOMENS MULTIVITAMIN PLUS PO) Take 1 tablet by mouth daily., Until Discontinued, Historical Med    omeprazole (PRILOSEC) 40 MG capsule Take 40 mg by mouth daily with breakfast. , Until  Discontinued, Historical Med    sertraline (ZOLOFT) 100 MG tablet Take 100 mg by mouth every morning. Takes as directed, Starting 02/18/2011, Until Discontinued, Historical Med    triamterene-hydrochlorothiazide (MAXZIDE-25) 37.5-25 MG per tablet Take 1 tablet by mouth every morning. Takes one tablet by mouth once daily, Starting 02/15/2011, Until Discontinued, Historical Med         Signed: Anastasio Auerbach. Samaj Wessells   PAC  11/27/2011, 5:49 PM

## 2011-12-10 ENCOUNTER — Ambulatory Visit
Payer: BC Managed Care – PPO | Attending: Orthopedic Surgery | Admitting: Rehabilitative and Restorative Service Providers"

## 2011-12-10 DIAGNOSIS — IMO0001 Reserved for inherently not codable concepts without codable children: Secondary | ICD-10-CM | POA: Insufficient documentation

## 2011-12-10 DIAGNOSIS — Z96659 Presence of unspecified artificial knee joint: Secondary | ICD-10-CM | POA: Insufficient documentation

## 2011-12-10 DIAGNOSIS — M6281 Muscle weakness (generalized): Secondary | ICD-10-CM | POA: Insufficient documentation

## 2011-12-10 DIAGNOSIS — M25569 Pain in unspecified knee: Secondary | ICD-10-CM | POA: Insufficient documentation

## 2011-12-14 ENCOUNTER — Ambulatory Visit: Payer: BC Managed Care – PPO | Admitting: Rehabilitation

## 2011-12-21 ENCOUNTER — Ambulatory Visit: Payer: BC Managed Care – PPO | Admitting: Physical Therapy

## 2011-12-23 ENCOUNTER — Ambulatory Visit: Payer: BC Managed Care – PPO | Admitting: Rehabilitative and Restorative Service Providers"

## 2011-12-28 ENCOUNTER — Ambulatory Visit
Payer: BC Managed Care – PPO | Attending: Orthopedic Surgery | Admitting: Rehabilitative and Restorative Service Providers"

## 2011-12-28 DIAGNOSIS — M6281 Muscle weakness (generalized): Secondary | ICD-10-CM | POA: Insufficient documentation

## 2011-12-28 DIAGNOSIS — Z96659 Presence of unspecified artificial knee joint: Secondary | ICD-10-CM | POA: Insufficient documentation

## 2011-12-28 DIAGNOSIS — M25569 Pain in unspecified knee: Secondary | ICD-10-CM | POA: Insufficient documentation

## 2011-12-28 DIAGNOSIS — IMO0001 Reserved for inherently not codable concepts without codable children: Secondary | ICD-10-CM | POA: Insufficient documentation

## 2011-12-30 ENCOUNTER — Ambulatory Visit: Payer: BC Managed Care – PPO | Admitting: Rehabilitative and Restorative Service Providers"

## 2012-01-04 ENCOUNTER — Ambulatory Visit: Payer: BC Managed Care – PPO | Admitting: Physical Therapy

## 2012-01-05 ENCOUNTER — Other Ambulatory Visit: Payer: Self-pay | Admitting: Gastroenterology

## 2012-01-06 ENCOUNTER — Ambulatory Visit: Payer: BC Managed Care – PPO | Admitting: Physical Therapy

## 2012-01-11 ENCOUNTER — Ambulatory Visit: Payer: BC Managed Care – PPO | Admitting: Rehabilitative and Restorative Service Providers"

## 2012-01-13 ENCOUNTER — Ambulatory Visit: Payer: BC Managed Care – PPO | Admitting: Rehabilitation

## 2012-01-13 ENCOUNTER — Encounter: Payer: BC Managed Care – PPO | Admitting: Rehabilitative and Restorative Service Providers"

## 2012-01-18 ENCOUNTER — Encounter: Payer: BC Managed Care – PPO | Admitting: Rehabilitative and Restorative Service Providers"

## 2012-01-19 ENCOUNTER — Encounter: Payer: BC Managed Care – PPO | Admitting: Rehabilitative and Restorative Service Providers"

## 2012-01-20 ENCOUNTER — Encounter: Payer: BC Managed Care – PPO | Admitting: Rehabilitative and Restorative Service Providers"

## 2012-01-28 ENCOUNTER — Encounter: Payer: BC Managed Care – PPO | Admitting: Rehabilitative and Restorative Service Providers"

## 2012-02-07 ENCOUNTER — Other Ambulatory Visit: Payer: Self-pay | Admitting: Gastroenterology

## 2012-03-06 ENCOUNTER — Other Ambulatory Visit: Payer: Self-pay | Admitting: Gastroenterology

## 2012-04-04 ENCOUNTER — Other Ambulatory Visit: Payer: Self-pay | Admitting: Gastroenterology

## 2012-05-11 ENCOUNTER — Other Ambulatory Visit: Payer: Self-pay | Admitting: Gastroenterology

## 2012-06-12 ENCOUNTER — Other Ambulatory Visit: Payer: Self-pay | Admitting: Gastroenterology

## 2012-07-17 ENCOUNTER — Other Ambulatory Visit: Payer: Self-pay | Admitting: Gastroenterology

## 2012-07-19 ENCOUNTER — Other Ambulatory Visit: Payer: Self-pay

## 2012-07-19 MED ORDER — OMEPRAZOLE 40 MG PO CPDR: DELAYED_RELEASE_CAPSULE | ORAL | Status: DC

## 2012-08-25 ENCOUNTER — Encounter (HOSPITAL_COMMUNITY): Payer: Self-pay | Admitting: Family Medicine

## 2012-08-25 ENCOUNTER — Emergency Department (HOSPITAL_COMMUNITY)
Admission: EM | Admit: 2012-08-25 | Discharge: 2012-08-25 | Disposition: A | Discharge status: Nursing Home (Includes ICF) | Payer: BC Managed Care – PPO | Source: Home / Self Care

## 2012-08-25 ENCOUNTER — Inpatient Hospital Stay (HOSPITAL_COMMUNITY)
Admission: EM | Admit: 2012-08-25 | Discharge: 2012-08-29 | DRG: 182 | Disposition: A | Discharge status: Home / Self Care | Payer: BC Managed Care – PPO | Attending: General Surgery | Admitting: General Surgery

## 2012-08-25 ENCOUNTER — Emergency Department (INDEPENDENT_AMBULATORY_CARE_PROVIDER_SITE_OTHER): Payer: BC Managed Care – PPO

## 2012-08-25 ENCOUNTER — Encounter (HOSPITAL_COMMUNITY): Payer: Self-pay | Admitting: Emergency Medicine

## 2012-08-25 ENCOUNTER — Emergency Department (HOSPITAL_COMMUNITY): Payer: BC Managed Care – PPO

## 2012-08-25 DIAGNOSIS — K630 Abscess of intestine: Secondary | ICD-10-CM | POA: Diagnosis present

## 2012-08-25 DIAGNOSIS — E669 Obesity, unspecified: Secondary | ICD-10-CM | POA: Diagnosis present

## 2012-08-25 DIAGNOSIS — K5732 Diverticulitis of large intestine without perforation or abscess without bleeding: Principal | ICD-10-CM | POA: Diagnosis present

## 2012-08-25 DIAGNOSIS — R112 Nausea with vomiting, unspecified: Secondary | ICD-10-CM

## 2012-08-25 DIAGNOSIS — I10 Essential (primary) hypertension: Secondary | ICD-10-CM

## 2012-08-25 DIAGNOSIS — Z87898 Personal history of other specified conditions: Secondary | ICD-10-CM

## 2012-08-25 DIAGNOSIS — D72829 Elevated white blood cell count, unspecified: Secondary | ICD-10-CM | POA: Diagnosis present

## 2012-08-25 DIAGNOSIS — R52 Pain, unspecified: Secondary | ICD-10-CM

## 2012-08-25 DIAGNOSIS — Z96659 Presence of unspecified artificial knee joint: Secondary | ICD-10-CM

## 2012-08-25 DIAGNOSIS — K2970 Gastritis, unspecified, without bleeding: Secondary | ICD-10-CM | POA: Diagnosis present

## 2012-08-25 DIAGNOSIS — K219 Gastro-esophageal reflux disease without esophagitis: Secondary | ICD-10-CM | POA: Diagnosis present

## 2012-08-25 DIAGNOSIS — Z7982 Long term (current) use of aspirin: Secondary | ICD-10-CM

## 2012-08-25 DIAGNOSIS — Z9089 Acquired absence of other organs: Secondary | ICD-10-CM

## 2012-08-25 DIAGNOSIS — R109 Unspecified abdominal pain: Secondary | ICD-10-CM

## 2012-08-25 DIAGNOSIS — K5780 Diverticulitis of intestine, part unspecified, with perforation and abscess without bleeding: Secondary | ICD-10-CM | POA: Diagnosis present

## 2012-08-25 DIAGNOSIS — F411 Generalized anxiety disorder: Secondary | ICD-10-CM | POA: Diagnosis present

## 2012-08-25 LAB — COMPREHENSIVE METABOLIC PANEL
AST: 22 U/L (ref 0–37)
CO2: 26 mEq/L (ref 19–32)
Calcium: 9.8 mg/dL (ref 8.4–10.5)
Creatinine, Ser: 0.92 mg/dL (ref 0.50–1.10)
GFR calc non Af Amer: 70 mL/min — ABNORMAL LOW (ref >90)
Total Protein: 8.6 g/dL — ABNORMAL HIGH (ref 6.0–8.3)

## 2012-08-25 LAB — URINALYSIS, ROUTINE W REFLEX MICROSCOPIC
Hgb urine dipstick: NEGATIVE
Nitrite: NEGATIVE
Specific Gravity, Urine: 1.023 (ref 1.005–1.030)
Urobilinogen, UA: 0.2 mg/dL (ref 0.0–1.0)
pH: 6 (ref 5.0–8.0)

## 2012-08-25 LAB — POCT URINALYSIS DIP (DEVICE)
Bilirubin Urine: NEGATIVE
Glucose, UA: NEGATIVE mg/dL
Ketones, ur: NEGATIVE mg/dL
Leukocytes, UA: NEGATIVE
pH: 6.5 (ref 5.0–8.0)

## 2012-08-25 LAB — CBC WITH DIFFERENTIAL/PLATELET
Basophils Relative: 0 % (ref 0–1)
HCT: 42.7 % (ref 36.0–46.0)
Hemoglobin: 14.7 g/dL (ref 12.0–15.0)
Lymphocytes Relative: 8 % — ABNORMAL LOW (ref 12–46)
Lymphs Abs: 1.3 10*3/uL (ref 0.7–4.0)
MCHC: 34.4 g/dL (ref 30.0–36.0)
Monocytes Absolute: 1 10*3/uL (ref 0.1–1.0)
Monocytes Relative: 6 % (ref 3–12)
Neutro Abs: 15.1 10*3/uL — ABNORMAL HIGH (ref 1.7–7.7)
Neutrophils Relative %: 87 % — ABNORMAL HIGH (ref 43–77)
RBC: 4.84 MIL/uL (ref 3.87–5.11)

## 2012-08-25 MED ORDER — CIPROFLOXACIN IN D5W 400 MG/200ML IV SOLN
400.0000 mg | Freq: Two times a day (BID) | INTRAVENOUS | Status: DC
  Administered 2012-08-25 – 2012-08-29 (×8): 400 mg via INTRAVENOUS
  Filled 2012-08-25 (×10): qty 200

## 2012-08-25 MED ORDER — FENTANYL CITRATE 0.05 MG/ML IJ SOLN
50.0000 ug | INTRAMUSCULAR | Status: DC | PRN
  Administered 2012-08-27 (×2): 50 ug via INTRAVENOUS
  Filled 2012-08-25 (×2): qty 2

## 2012-08-25 MED ORDER — METRONIDAZOLE IN NACL 5-0.79 MG/ML-% IV SOLN
500.0000 mg | Freq: Three times a day (TID) | INTRAVENOUS | Status: DC
  Administered 2012-08-26 – 2012-08-29 (×10): 500 mg via INTRAVENOUS
  Filled 2012-08-25 (×12): qty 100

## 2012-08-25 MED ORDER — SERTRALINE HCL 100 MG PO TABS
150.0000 mg | ORAL_TABLET | Freq: Every morning | ORAL | Status: DC
  Administered 2012-08-26 – 2012-08-29 (×4): 150 mg via ORAL
  Filled 2012-08-25 (×2): qty 1.5
  Filled 2012-08-25: qty 1
  Filled 2012-08-25 (×2): qty 1.5

## 2012-08-25 MED ORDER — KCL IN DEXTROSE-NACL 20-5-0.45 MEQ/L-%-% IV SOLN
INTRAVENOUS | Status: DC
  Administered 2012-08-25 – 2012-08-28 (×7): via INTRAVENOUS
  Filled 2012-08-25 (×11): qty 1000

## 2012-08-25 MED ORDER — SODIUM CHLORIDE 0.9 % IV SOLN
Freq: Once | INTRAVENOUS | Status: AC
  Administered 2012-08-25: 20:00:00 via INTRAVENOUS

## 2012-08-25 MED ORDER — IOHEXOL 300 MG/ML  SOLN
50.0000 mL | Freq: Once | INTRAMUSCULAR | Status: AC | PRN
  Administered 2012-08-25: 50 mL via ORAL

## 2012-08-25 MED ORDER — DIPHENHYDRAMINE HCL 25 MG PO CAPS: 25.0000 mg | ORAL_CAPSULE | Freq: Four times a day (QID) | ORAL | Status: DC | PRN

## 2012-08-25 MED ORDER — IOHEXOL 300 MG/ML  SOLN: 25.0000 mL | INTRAMUSCULAR | Status: DC

## 2012-08-25 MED ORDER — METRONIDAZOLE IN NACL 5-0.79 MG/ML-% IV SOLN
500.0000 mg | Freq: Once | INTRAVENOUS | Status: DC
  Filled 2012-08-25: qty 100

## 2012-08-25 MED ORDER — MORPHINE SULFATE 4 MG/ML IJ SOLN
4.0000 mg | Freq: Once | INTRAMUSCULAR | Status: AC
  Administered 2012-08-25: 4 mg via INTRAVENOUS
  Filled 2012-08-25: qty 1

## 2012-08-25 MED ORDER — HYOSCYAMINE SULFATE ER 0.375 MG PO TB12
0.3750 mg | ORAL_TABLET | Freq: Two times a day (BID) | ORAL | Status: DC | PRN
  Filled 2012-08-25: qty 1

## 2012-08-25 MED ORDER — ONDANSETRON HCL 4 MG/2ML IJ SOLN
4.0000 mg | Freq: Four times a day (QID) | INTRAMUSCULAR | Status: DC | PRN
  Administered 2012-08-26 – 2012-08-29 (×4): 4 mg via INTRAVENOUS
  Filled 2012-08-25 (×4): qty 2

## 2012-08-25 MED ORDER — CIPROFLOXACIN IN D5W 400 MG/200ML IV SOLN
400.0000 mg | Freq: Once | INTRAVENOUS | Status: DC
  Filled 2012-08-25: qty 200

## 2012-08-25 MED ORDER — IOHEXOL 300 MG/ML  SOLN
100.0000 mL | Freq: Once | INTRAMUSCULAR | Status: AC | PRN
  Administered 2012-08-25: 100 mL via INTRAVENOUS

## 2012-08-25 MED ORDER — SODIUM CHLORIDE 0.9 % IV BOLUS (SEPSIS)
1000.0000 mL | Freq: Once | INTRAVENOUS | Status: AC
  Administered 2012-08-25: 1000 mL via INTRAVENOUS

## 2012-08-25 MED ORDER — METHOCARBAMOL 500 MG PO TABS: 500.0000 mg | ORAL_TABLET | Freq: Four times a day (QID) | ORAL | Status: DC | PRN

## 2012-08-25 MED ORDER — PANTOPRAZOLE SODIUM 40 MG IV SOLR
40.0000 mg | Freq: Every day | INTRAVENOUS | Status: DC
  Administered 2012-08-25: 40 mg via INTRAVENOUS
  Filled 2012-08-25 (×2): qty 40

## 2012-08-25 MED ORDER — HYDROCODONE-ACETAMINOPHEN 7.5-325 MG PO TABS
1.0000 | ORAL_TABLET | ORAL | Status: DC | PRN
  Administered 2012-08-26 (×4): 1 via ORAL
  Administered 2012-08-27: 2 via ORAL
  Administered 2012-08-28: 1 via ORAL
  Administered 2012-08-28: 2 via ORAL
  Filled 2012-08-25: qty 1
  Filled 2012-08-25 (×2): qty 2
  Filled 2012-08-25 (×3): qty 1
  Filled 2012-08-25: qty 2

## 2012-08-25 MED ORDER — ONDANSETRON 4 MG PO TBDP
4.0000 mg | ORAL_TABLET | Freq: Once | ORAL | Status: AC
  Administered 2012-08-25: 4 mg via ORAL

## 2012-08-25 MED ORDER — ONDANSETRON 4 MG PO TBDP
ORAL_TABLET | ORAL | Status: AC
  Filled 2012-08-25: qty 1

## 2012-08-25 MED ORDER — TRIAMTERENE-HCTZ 37.5-25 MG PO TABS
1.0000 | ORAL_TABLET | Freq: Every morning | ORAL | Status: DC
  Administered 2012-08-26: 1 via ORAL
  Filled 2012-08-25: qty 1

## 2012-08-25 MED ORDER — METOPROLOL SUCCINATE ER 25 MG PO TB24: 25.0000 mg | ORAL_TABLET | Freq: Every morning | ORAL | Status: DC

## 2012-08-25 MED ORDER — ENOXAPARIN SODIUM 40 MG/0.4ML ~~LOC~~ SOLN
40.0000 mg | SUBCUTANEOUS | Status: DC
  Administered 2012-08-25 – 2012-08-28 (×4): 40 mg via SUBCUTANEOUS
  Filled 2012-08-25 (×5): qty 0.4

## 2012-08-25 MED ORDER — ONDANSETRON HCL 4 MG/2ML IJ SOLN
4.0000 mg | Freq: Once | INTRAMUSCULAR | Status: AC
  Administered 2012-08-25: 4 mg via INTRAVENOUS
  Filled 2012-08-25: qty 2

## 2012-08-25 NOTE — ED Notes (Signed)
Pt in CT.

## 2012-08-25 NOTE — ED Provider Notes (Signed)
History    CSN: [id number] Arrival date & time 08/25/12  1232  First MD Initiated Contact with Patient 08/25/12 1526     Chief Complaint  Patient presents with  . Abdominal Pain   (Consider location/radiation/quality/duration/timing/severity/associated sxs/prior Treatment) The history is provided by the patient and medical records.   Patient presents to the ED from urgent care to r/o diverticulitis.  Patient states she's had lower abdominal pain for the past several days, but worse beginning yesterday around 4 PM. Initially the pain was a cramping sensation, but now sharp, midline with radiation to LLQ.  BM loose, but non-bloody.  Pt also notes some increased urinary frequency without dysuria but there is a "pressure and pain" in her abdomen when she urinates.  No hematuria or vaginal discharge.  Pt has hx of diverticulosis confirmed by colonoscopy in 2009.  No hx of diverticulitis.  No recent fevers, sweats, or chills.  Prior cholecystectomy.  No chest pain, SOB, palpitations, dizziness, or weakness.  Has been NPO since this morning.  Past Medical History  Diagnosis Date  . Obesity   . Anxiety disorder   . Gallstones   . Hyperlipidemia   . Hypertension   . GERD (gastroesophageal reflux disease)    Past Surgical History  Procedure Laterality Date  . Cholecystectomy    . Tonsillectomy      as child-tonsils and adenoids  . Uterine ablation  2003  . Total knee arthroplasty  11/23/2011    Procedure: TOTAL KNEE ARTHROPLASTY;  Surgeon: Shelda Pal, MD;  Location: [facility];  Service: Orthopedics;  Laterality: Right;   Family History  Problem Relation Age of Onset  . Colon cancer Neg Hx    History  Substance Use Topics  . Smoking status: Never Smoker   . Smokeless tobacco: Never Used  . Alcohol Use: Yes     Comment: occassionally   OB History   Grav Para Term Preterm Abortions TAB SAB Ect Mult Living                 Review of Systems  Gastrointestinal: Positive for nausea  and abdominal pain.  Genitourinary: Positive for dysuria.  All other systems reviewed and are negative.    Allergies  Dilaudid and Quinapril hcl  Home Medications   Current Outpatient Rx  Name  Route  Sig  Dispense  Refill  . aspirin EC 325 MG tablet   Oral   Take 1 tablet (325 mg total) by mouth 2 (two) times daily. X 4 weeks   60 tablet   0   . diphenhydrAMINE (BENADRYL) 25 mg capsule   Oral   Take 1 capsule (25 mg total) by mouth every 6 (six) hours as needed for itching, allergies or sleep.   30 capsule      . docusate sodium 100 MG CAPS   Oral   Take 100 mg by mouth 2 (two) times daily.   10 capsule      . ferrous sulfate 325 (65 FE) MG tablet   Oral   Take 1 tablet (325 mg total) by mouth 3 (three) times daily after meals.         Marland Kitchen HYDROcodone-acetaminophen (NORCO) 7.5-325 MG per tablet   Oral   Take 1-2 tablets by mouth every 4 (four) hours as needed for pain.   120 tablet   0   . EXPIRED: hyoscyamine (LEVBID) 0.375 MG 12 hr tablet   Oral   Take 1 tablet (0.375 mg total) by  mouth every 12 (twelve) hours as needed for cramping.   60 tablet   2   . methocarbamol (ROBAXIN) 500 MG tablet   Oral   Take 1 tablet (500 mg total) by mouth every 6 (six) hours as needed (muscle spasms).         . metoprolol succinate (TOPROL-XL) 25 MG 24 hr tablet   Oral   Take 25 mg by mouth every morning. Takes one tablet by mouth once daily         . Multiple Vitamins-Minerals (WOMENS MULTIVITAMIN PLUS PO)   Oral   Take 1 tablet by mouth daily.         Marland Kitchen omeprazole (PRILOSEC) 40 MG capsule   Oral   Take 40 mg by mouth daily with breakfast.          . omeprazole (PRILOSEC) 40 MG capsule      TAKE 1 CAPSULE BY MOUTH EVERY DAY BEFORE A MEAL   30 capsule   6   . polyethylene glycol (MIRALAX / GLYCOLAX) packet   Oral   Take 17 g by mouth 2 (two) times daily.   14 each      . sertraline (ZOLOFT) 100 MG tablet   Oral   Take 100 mg by mouth every morning.  Takes as directed         . triamterene-hydrochlorothiazide (MAXZIDE-25) 37.5-25 MG per tablet   Oral   Take 1 tablet by mouth every morning. Takes one tablet by mouth once daily          BP 162/99  Pulse 81  Temp(Src) 99.6 F (37.6 C) (Oral)  Resp 18  SpO2 98%  Physical Exam  Nursing note and vitals reviewed. Constitutional: She is oriented to person, place, and time. She appears well-developed and well-nourished.  HENT:  Head: Normocephalic and atraumatic.  Mouth/Throat: Uvula is midline and oropharynx is clear and moist. Mucous membranes are dry.  Eyes: Conjunctivae and EOM are normal.  Neck: Normal range of motion. Neck supple.  Cardiovascular: Normal rate, regular rhythm and normal heart sounds.   Pulmonary/Chest: Effort normal and breath sounds normal.  Abdominal: Soft. Bowel sounds are normal. There is tenderness in the left lower quadrant. There is no guarding, no CVA tenderness, no tenderness at McBurney's point and negative Murphy's sign.    Musculoskeletal: Normal range of motion.  Neurological: She is alert and oriented to person, place, and time.  Skin: Skin is warm and dry.  Psychiatric: She has a normal mood and affect.    ED Course  Procedures (including critical care time) Labs Reviewed  CBC WITH DIFFERENTIAL - Abnormal; Notable for the following:    WBC 17.4 (*)    Neutrophils Relative % 87 (*)    Neutro Abs 15.1 (*)    Lymphocytes Relative 8 (*)    All other components within normal limits  COMPREHENSIVE METABOLIC PANEL - Abnormal; Notable for the following:    Glucose, Bld 118 (*)    Total Protein 8.6 (*)    GFR calc non Af Amer 70 (*)    GFR calc Af Amer 82 (*)    All other components within normal limits  URINALYSIS, ROUTINE W REFLEX MICROSCOPIC - Abnormal; Notable for the following:    Color, Urine AMBER (*)    APPearance HAZY (*)    Bilirubin Urine SMALL (*)    All other components within normal limits  BASIC METABOLIC PANEL  CBC    Dg Abd 1 View  08/25/2012   *  RADIOLOGY REPORT*  Clinical Data: Abdominal pain.  Previous gallbladder surgery.  Low grade fever.  ABDOMEN - 1 VIEW  Comparison: 03/17/2011  Findings: Bowel gas pattern is normal without evidence of ileus, obstruction or detectable free air.  Clips in the right upper quadrant indicate previous cholecystectomy.  No abnormal calcifications.  Ordinary degenerative changes effect the lumbar spine.  IMPRESSION: Unremarkable supine abdominal radiograph.   Original Report Authenticated By: Paulina Fusi, M.D.   Ct Abdomen Pelvis W Contrast  08/25/2012   *RADIOLOGY REPORT*  Clinical Data: Lower abdominal and abdominal pain.  Nausea.  Fever.  CT ABDOMEN AND PELVIS WITH CONTRAST  Technique:  Multidetector CT imaging of the abdomen and pelvis was performed following the standard protocol during bolus administration of intravenous contrast.  Contrast: OMNIPAQUE IOHEXOL 300 MG/ML  SOLN  Comparison: Radiographs dated 08/25/2012 and CT scan dated 12/09/2008  Findings: The patient has acute sigmoid diverticulitis with pericolonic soft tissue inflammation and soft tissue stranding. There are a few tiny bubbles of air extrinsic to the bowel.  There is a tiny collection of pus along the inferior margin of the involved area measuring at 10 x 6 mm.  There are a few scattered diverticula in the descending portion of the colon.  There is a small bubble of air in the bladder.  Has the patient had recent catheterization?  If not, the possibility of a bladder infection should be considered.  There is slight hepatomegaly and hepatic steatosis.  No focal liver lesions.  There are multiple small lesions in  the spleen consistent with  small hemangiomas.  Gallbladder has been removed.  Chronic slight prominence of biliary tree.  Pancreas, adrenal glands, and kidneys demonstrate no significant abnormalities.  15 mm cyst on the upper pole of the left kidney.  Terminal ileum and appendix are normal.  Uterus  and ovaries are normal.  No acute osseous abnormality.  IMPRESSION: Acute sigmoid diverticulitis with micro perforations and a tiny abscess adjacent to the area. 2.  Small amount of air in the bladder.  If the patient has not had a recent catheterization, bladder infection is likely.   Original Report Authenticated By: Francene Boyers, M.D.   No diagnosis found.  MDM   Labs as above, leukocytosis at 17.4.   U/a without definitive infection.  CT abd/pelvis-- sigmoid diverticulitis with micro perforations, and small abscess formation.  IV cipro and flagyl started in the ED.  Consulted surgery, Dr. [name]-- he evaluated pt in the ED and will admit.  VS stable for transfer to floor.  Garlon Hatchet, PA-C 08/25/12 2205

## 2012-08-25 NOTE — ED Notes (Signed)
Pt requesting medication for headache

## 2012-08-25 NOTE — ED Notes (Signed)
Patient transported to CT 

## 2012-08-25 NOTE — ED Provider Notes (Signed)
History    CSN: [id number] Arrival date & time 08/25/12  0829  First MD Initiated Contact with Patient 08/25/12 [phone]     Chief Complaint  Patient presents with  . Abdominal Pain   (Consider location/radiation/quality/duration/timing/severity/associated sxs/prior Treatment) HPI Comments: 52 year old morbidly obese female is complaining of abdominal pain from the umbilicus to the pelvis. It is mid sagittal with some radiation to the left. It began suddenly at 4 PM yesterday. The pain is constant but it does wax and wane in intensity. She states she is having normal bowel movements(see below) and she has seen no evidence of bleeding or maroon or dark tarry stools. Also complaining of urinary frequency and dribbles. She has vomited twice today. She states her temperature yesterday was 99.2. She is currently feeling nauseated. She states she is having normal bowel movements however her bowel movements are different today in that it comes out "in piles" rather than her usual formed stool. She had her usual bowel movement this morning. Denies diarrhea or constipation. She is no longer taking hydrocodone nor is she taking stool softeners or laxatives. She has a history of cholecystectomy.  Past Medical History  Diagnosis Date  . Obesity   . Anxiety disorder   . Gallstones   . Hyperlipidemia   . Hypertension   . GERD (gastroesophageal reflux disease)    Past Surgical History  Procedure Laterality Date  . Cholecystectomy    . Tonsillectomy      as child-tonsils and adenoids  . Uterine ablation  2003  . Total knee arthroplasty  11/23/2011    Procedure: TOTAL KNEE ARTHROPLASTY;  Surgeon: Shelda Pal, MD;  Location: [facility];  Service: Orthopedics;  Laterality: Right;   Family History  Problem Relation Age of Onset  . Colon cancer Neg Hx    History  Substance Use Topics  . Smoking status: Never Smoker   . Smokeless tobacco: Never Used  . Alcohol Use: Yes     Comment: occassionally    OB History   Grav Para Term Preterm Abortions TAB SAB Ect Mult Living                 Review of Systems  Constitutional: Positive for fever, activity change and appetite change.  HENT: Negative.   Respiratory: Negative for cough, chest tightness and shortness of breath.   Cardiovascular: Negative for chest pain.  Gastrointestinal: Positive for nausea, vomiting and abdominal pain. Negative for diarrhea, constipation and blood in stool.  Genitourinary: Positive for dysuria and frequency. Negative for vaginal bleeding, vaginal discharge, vaginal pain and pelvic pain.  Musculoskeletal: Negative.   Neurological: Negative.     Allergies  Dilaudid and Quinapril hcl  Home Medications   Current Outpatient Rx  Name  Route  Sig  Dispense  Refill  . omeprazole (PRILOSEC) 40 MG capsule   Oral   Take 40 mg by mouth daily with breakfast.          . sertraline (ZOLOFT) 100 MG tablet   Oral   Take 100 mg by mouth every morning. Takes as directed         . triamterene-hydrochlorothiazide (MAXZIDE-25) 37.5-25 MG per tablet   Oral   Take 1 tablet by mouth every morning. Takes one tablet by mouth once daily         . aspirin EC 325 MG tablet   Oral   Take 1 tablet (325 mg total) by mouth 2 (two) times daily. X 4 weeks   60  tablet   0   . diphenhydrAMINE (BENADRYL) 25 mg capsule   Oral   Take 1 capsule (25 mg total) by mouth every 6 (six) hours as needed for itching, allergies or sleep.   30 capsule      . docusate sodium 100 MG CAPS   Oral   Take 100 mg by mouth 2 (two) times daily.   10 capsule      . ferrous sulfate 325 (65 FE) MG tablet   Oral   Take 1 tablet (325 mg total) by mouth 3 (three) times daily after meals.         Marland Kitchen HYDROcodone-acetaminophen (NORCO) 7.5-325 MG per tablet   Oral   Take 1-2 tablets by mouth every 4 (four) hours as needed for pain.   120 tablet   0   . EXPIRED: hyoscyamine (LEVBID) 0.375 MG 12 hr tablet   Oral   Take 1 tablet (0.375  mg total) by mouth every 12 (twelve) hours as needed for cramping.   60 tablet   2   . methocarbamol (ROBAXIN) 500 MG tablet   Oral   Take 1 tablet (500 mg total) by mouth every 6 (six) hours as needed (muscle spasms).         . metoprolol succinate (TOPROL-XL) 25 MG 24 hr tablet   Oral   Take 25 mg by mouth every morning. Takes one tablet by mouth once daily         . Multiple Vitamins-Minerals (WOMENS MULTIVITAMIN PLUS PO)   Oral   Take 1 tablet by mouth daily.         Marland Kitchen omeprazole (PRILOSEC) 40 MG capsule      TAKE 1 CAPSULE BY MOUTH EVERY DAY BEFORE A MEAL   30 capsule   6   . polyethylene glycol (MIRALAX / GLYCOLAX) packet   Oral   Take 17 g by mouth 2 (two) times daily.   14 each       BP 127/89  Pulse 91  Temp(Src) 97.2 F (36.2 C) (Oral)  Resp 97  SpO2 97% Physical Exam  Nursing note and vitals reviewed. Constitutional: She is oriented to person, place, and time. She appears well-developed and well-nourished.  Appears mildly he will with mild abdominal discomfort. She is sitting on the table.  Eyes: EOM are normal.  Neck: Normal range of motion. Neck supple.  Cardiovascular: Normal rate, regular rhythm and normal heart sounds.   Pulmonary/Chest: Effort normal and breath sounds normal. No respiratory distress. She has no wheezes.  Abdominal: Soft. She exhibits no distension and no mass. There is tenderness. There is no rebound and no guarding.  There is tenderness 1-2 fingerbreadths inferior to the umbilicus and tracks down toward the pelvis. Does not include the pelvis. There is greater tenderness to the left of the midline. No tenderness to the right. No tenderness to the upper left or right quadrant.  Neurological: She is alert and oriented to person, place, and time. She exhibits normal muscle tone.  Skin: Skin is warm and dry. No rash noted.  Psychiatric: She has a normal mood and affect.    ED Course  Procedures (including critical care  time) Labs Reviewed  POCT URINALYSIS DIP (DEVICE)   Dg Abd 1 View  08/25/2012   *RADIOLOGY REPORT*  Clinical Data: Abdominal pain.  Previous gallbladder surgery.  Low grade fever.  ABDOMEN - 1 VIEW  Comparison: 03/17/2011  Findings: Bowel gas pattern is normal without evidence of ileus, obstruction  or detectable free air.  Clips in the right upper quadrant indicate previous cholecystectomy.  No abnormal calcifications.  Ordinary degenerative changes effect the lumbar spine.  IMPRESSION: Unremarkable supine abdominal radiograph.   Original Report Authenticated By: Paulina Fusi, M.D.   along is aware that the Zofran and she is a fistula on the Zofran and has never had an 1. Abdominal pain, acute   2. Nausea and vomiting in adult   3. History of fever     MDM  With acute onset of pain in the lower midabdomen radiating to the left associated with low-grade fever yesterday and vomiting today we will transfer her to the emergency department to evaluate for possible diverticulitis. She has a history of diverticulosis. Differential diagnosis would include constipation. Consulted with Dr. Lorenz Coaster.  Hayden Rasmussen, NP 08/25/12 1201

## 2012-08-25 NOTE — ED Notes (Signed)
Pt returned from CT °

## 2012-08-25 NOTE — ED Notes (Signed)
Per pt having lower abdominal pain that's is constant and dull with intermittent sharp pains. sts some nausea. Denies blood in stool. sts low grade fever and vomiting this am. Sent here from [facility] to r/o diverticulitis. Hx of same.

## 2012-08-25 NOTE — H&P (Signed)
[name] [name] is an 52 y.o. female.   Chief Complaint: LLQ and suprapubic abdominal pain HPI: Patient has a known history of diverticulosis found on colonoscopy by Dr. Christella Hartigan at [facility]. He has had no previous episodes of diverticulitis. Yesterday, she developed left lower quadrant and suprapubic abdominal pain. She initially thought she had a kidney stone. The pain persisted. Through the night last night, she had multiple loose bowel movements each time she urinated. She came to the emergency department for evaluation. Workup included labs showing leukocytosis. CT scan of the abdomen and pelvis shows sigmoid diverticulitis with microperforation and very small abscess. I was asked to see her for admission. The pain has lessened a bit but does persist.  Past Medical History  Diagnosis Date  . Obesity   . Anxiety disorder   . Gallstones   . Hyperlipidemia   . Hypertension   . GERD (gastroesophageal reflux disease)     Past Surgical History  Procedure Laterality Date  . Cholecystectomy    . Tonsillectomy      as child-tonsils and adenoids  . Uterine ablation  2003  . 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 Colon cancer Neg Hx    Social History:  reports that she has never smoked. She has never used smokeless tobacco. She reports that  drinks alcohol. She reports that she does not use illicit drugs.She works for [employer]	[place].  Allergies:  Allergies  Allergen Reactions  . Dilaudid (Hydromorphone Hcl) Itching  . Quinapril Hcl Hives and Swelling     (Not in a hospital admission)  Results for orders placed during the hospital encounter of 08/25/12 (from the past 48 hour(s))  CBC WITH DIFFERENTIAL     Status: Abnormal   Collection Time    08/25/12 12:53 PM      Result Value Range   WBC 17.4 (*)  4.0 - 10.5 K/uL   RBC 4.84  3.87 - 5.11 MIL/uL   Hemoglobin 14.7  12.0 - 15.0 g/dL   HCT 91.4  78.2 - 95.6 %   MCV 88.2  78.0 - 100.0 fL   MCH 30.4  26.0 - 34.0 pg   MCHC 34.4  30.0 - 36.0 g/dL   RDW 21.3  08.6 - 57.8 %   Platelets 270  150 - 400 K/uL   Neutrophils Relative % 87 (*) 43 - 77 %   Neutro Abs 15.1 (*) 1.7 - 7.7 K/uL   Lymphocytes Relative 8 (*) 12 - 46 %   Lymphs Abs 1.3  0.7 - 4.0 K/uL   Monocytes Relative 6  3 - 12 %   Monocytes Absolute 1.0  0.1 - 1.0 K/uL   Eosinophils Relative 0  0 - 5 %   Eosinophils Absolute 0.0  0.0 - 0.7 K/uL   Basophils Relative 0  0 - 1 %   Basophils Absolute 0.0  0.0 - 0.1 K/uL  COMPREHENSIVE METABOLIC PANEL     Status: Abnormal   Collection Time    08/25/12 12:53 PM      Result Value Range   Sodium 139  135 - 145 mEq/L   Potassium 3.7  3.5 - 5.1 mEq/L   Chloride 100  96 - 112 mEq/L   CO2 26  19 - 32 mEq/L   Glucose,  Bld 118 (*) 70 - 99 mg/dL   BUN 16  6 - 23 mg/dL   Creatinine, Ser 1.61  0.50 - 1.10 mg/dL   Calcium 9.8  8.4 - 09.6 mg/dL   Total Protein 8.6 (*) 6.0 - 8.3 g/dL   Albumin 4.2  3.5 - 5.2 g/dL   AST 22  0 - 37 U/L   ALT 22  0 - 35 U/L   Alkaline Phosphatase 93  39 - 117 U/L   Total Bilirubin 0.8  0.3 - 1.2 mg/dL   GFR calc non Af Amer 70 (*) >90 mL/min   GFR calc Af Amer 82 (*) >90 mL/min   Comment:            The eGFR has been calculated     using the CKD EPI equation.     This calculation has not been     validated in all clinical     situations.     eGFR's persistently     <90 mL/min signify     possible Chronic Kidney Disease.  URINALYSIS, ROUTINE W REFLEX MICROSCOPIC     Status: Abnormal   Collection Time    08/25/12  6:33 PM      Result Value Range   Color, Urine AMBER (*) YELLOW   Comment: BIOCHEMICALS MAY BE AFFECTED BY COLOR   APPearance HAZY (*) CLEAR   Specific Gravity, Urine 1.023  1.005 - 1.030   pH 6.0  5.0 - 8.0   Glucose, UA NEGATIVE  NEGATIVE mg/dL   Hgb urine dipstick NEGATIVE  NEGATIVE    Bilirubin Urine SMALL (*) NEGATIVE   Ketones, ur NEGATIVE  NEGATIVE mg/dL   Protein, ur NEGATIVE  NEGATIVE mg/dL   Urobilinogen, UA 0.2  0.0 - 1.0 mg/dL   Nitrite NEGATIVE  NEGATIVE   Leukocytes, UA NEGATIVE  NEGATIVE   Comment: MICROSCOPIC NOT DONE ON URINES WITH NEGATIVE PROTEIN, BLOOD, LEUKOCYTES, NITRITE, OR GLUCOSE <1000 mg/dL.   Dg Abd 1 View  08/25/2012   *RADIOLOGY REPORT*  Clinical Data: Abdominal pain.  Previous gallbladder surgery.  Low grade fever.  ABDOMEN - 1 VIEW  Comparison: 03/17/2011  Findings: Bowel gas pattern is normal without evidence of ileus, obstruction or detectable free air.  Clips in the right upper quadrant indicate previous cholecystectomy.  No abnormal calcifications.  Ordinary degenerative changes effect the lumbar spine.  IMPRESSION: Unremarkable supine abdominal radiograph.   Original Report Authenticated By: Paulina Fusi, M.D.   Ct Abdomen Pelvis W Contrast  08/25/2012   *RADIOLOGY REPORT*  Clinical Data: Lower abdominal and abdominal pain.  Nausea.  Fever.  CT ABDOMEN AND PELVIS WITH CONTRAST  Technique:  Multidetector CT imaging of the abdomen and pelvis was performed following the standard protocol during bolus administration of intravenous contrast.  Contrast: OMNIPAQUE IOHEXOL 300 MG/ML  SOLN  Comparison: Radiographs dated 08/25/2012 and CT scan dated 12/09/2008  Findings: The patient has acute sigmoid diverticulitis with pericolonic soft tissue inflammation and soft tissue stranding. There are a few tiny bubbles of air extrinsic to the bowel.  There is a tiny collection of pus along the inferior margin of the involved area measuring at 10 x 6 mm.  There are a few scattered diverticula in the descending portion of the colon.  There is a small bubble of air in the bladder.  Has the patient had recent catheterization?  If not, the possibility of a bladder infection should be considered.  There is slight hepatomegaly and hepatic steatosis.  No focal liver  lesions.  There are multiple small lesions in  the spleen consistent with  small hemangiomas.  Gallbladder has been removed.  Chronic slight prominence of biliary tree.  Pancreas, adrenal glands, and kidneys demonstrate no significant abnormalities.  15 mm cyst on the upper pole of the left kidney.  Terminal ileum and appendix are normal.  Uterus and ovaries are normal.  No acute osseous abnormality.  IMPRESSION: Acute sigmoid diverticulitis with micro perforations and a tiny abscess adjacent to the area. 2.  Small amount of air in the bladder.  If the patient has not had a recent catheterization, bladder infection is likely.   Original Report Authenticated By: Francene Boyers, M.D.    Review of Systems  Constitutional: Positive for malaise/fatigue.  HENT: Negative.   Eyes: Negative.   Respiratory: Negative.   Cardiovascular: Negative.   Gastrointestinal: Positive for abdominal pain and diarrhea. Negative for nausea, vomiting, constipation and blood in stool.  Genitourinary: Negative.   Musculoskeletal: Negative.   Skin: Negative.   Neurological: Negative.   Endo/Heme/Allergies: Negative.   Psychiatric/Behavioral: Negative.     Blood pressure 136/71, pulse 68, temperature 99.1 F (37.3 C), temperature source Oral, resp. rate 15, SpO2 95.00%. Physical Exam  Constitutional: She is oriented to person, place, and time. She appears well-developed and well-nourished. No distress.  HENT:  Head: Normocephalic and atraumatic.  Mouth/Throat: Oropharynx is clear and moist. No oropharyngeal exudate.  Eyes: EOM are normal. Pupils are equal, round, and reactive to light. No scleral icterus.  Neck: Normal range of motion. Neck supple. No tracheal deviation present.  Cardiovascular: Normal rate, regular rhythm, normal heart sounds and intact distal pulses.   Respiratory: Effort normal and breath sounds normal. No stridor. No respiratory distress. She has no wheezes. She has no rales.  GI: Soft. Bowel  sounds are normal. She exhibits no distension. There is tenderness. There is no rebound and no guarding.  Tender left lower quadrant and suprapubic region with no guarding, no generalized peritoneal signs  Musculoskeletal: Normal range of motion. She exhibits no edema.  Neurological: She is alert and oriented to person, place, and time. She exhibits normal muscle tone. Coordination normal.  Skin: Skin is warm and dry.  Psychiatric: She has a normal mood and affect.     Assessment/Plan Sigmoid diverticulitis with microperforation and tiny abscess. We'll plan admission, bowel rest, IV antibiotics.If she worsens, she may need colectomy and colostomy. Plan was discussed in detail with her. The abscess is too small to drain at this point so we will see how she does clinically.  Shineka Auble E 08/25/2012, 8:11 PM

## 2012-08-25 NOTE — ED Notes (Signed)
Pt c/o lower abd pain onset yest at 1600... Prior to this she was fine... Pain is constant and dull w/intermittet sharp pains; pain increases w/pressure and movement; feels better when she lays flat on back... sxs also include: vomiting x2 this am, fever of 99.2 yest, dysuria, urinary freq/urgency, lower back pain... Denies: diarrhea, vag d/c... Hx of kidney stone... She is alert w/some discomfort due to pain.

## 2012-08-25 NOTE — ED Provider Notes (Signed)
Medical screening examination/treatment/procedure(s) were performed by non-physician practitioner and as supervising physician I was immediately available for consultation/collaboration.  Torrie Namba, M.D.  Deiona Hooper C Bilaal Leib, MD 08/25/12 1943 

## 2012-08-25 NOTE — ED Notes (Signed)
PA at bedside.

## 2012-08-26 DIAGNOSIS — K5780 Diverticulitis of intestine, part unspecified, with perforation and abscess without bleeding: Secondary | ICD-10-CM | POA: Diagnosis present

## 2012-08-26 LAB — BASIC METABOLIC PANEL
BUN: 13 mg/dL (ref 6–23)
Creatinine, Ser: 0.88 mg/dL (ref 0.50–1.10)
GFR calc Af Amer: 86 mL/min — ABNORMAL LOW (ref >90)
GFR calc non Af Amer: 74 mL/min — ABNORMAL LOW (ref >90)
Potassium: 3.5 mEq/L (ref 3.5–5.1)

## 2012-08-26 LAB — CBC
MCHC: 33.7 g/dL (ref 30.0–36.0)
RDW: 14.3 % (ref 11.5–15.5)

## 2012-08-26 MED ORDER — ALUM & MAG HYDROXIDE-SIMETH 200-200-20 MG/5ML PO SUSP: 30.0000 mL | Freq: Four times a day (QID) | ORAL | Status: DC | PRN

## 2012-08-26 MED ORDER — BLISTEX EX OINT
1.0000 "application " | TOPICAL_OINTMENT | Freq: Two times a day (BID) | CUTANEOUS | Status: DC
  Administered 2012-08-26 – 2012-08-29 (×6): 1 via TOPICAL
  Filled 2012-08-26 (×3): qty 10

## 2012-08-26 MED ORDER — SACCHAROMYCES BOULARDII 250 MG PO CAPS
250.0000 mg | ORAL_CAPSULE | Freq: Two times a day (BID) | ORAL | Status: DC
  Administered 2012-08-26 – 2012-08-29 (×7): 250 mg via ORAL
  Filled 2012-08-26 (×9): qty 1

## 2012-08-26 MED ORDER — LACTATED RINGERS IV BOLUS (SEPSIS): 1000.0000 mL | Freq: Three times a day (TID) | INTRAVENOUS | Status: AC | PRN

## 2012-08-26 MED ORDER — SERTRALINE HCL 50 MG PO TABS: 150.0000 mg | ORAL_TABLET | Freq: Every day | ORAL | Status: DC

## 2012-08-26 MED ORDER — MAGIC MOUTHWASH: 15.0000 mL | Freq: Four times a day (QID) | ORAL | Status: DC | PRN

## 2012-08-26 NOTE — ED Provider Notes (Signed)
Medical screening examination/treatment/procedure(s) were performed by non-physician practitioner and as supervising physician I was immediately available for consultation/collaboration.  Heriberto Stmartin L Tyrese Capriotti, MD 08/26/12 0006 

## 2012-08-26 NOTE — Progress Notes (Signed)
Patient ID: [name] [name], female   DOB: [birth year], 52 y.o.   MRN: [id number]    Subjective: Feels better, less pain, denies n/v, no BM  Objective: Vital signs in last 24 hours: Temp:  [97.9 F (36.6 C)-99.9 F (37.7 C)] 97.9 F (36.6 C) (07/04 1015) Pulse Rate:  [66-85] 66 (07/04 1015) Resp:  [15-18] 18 (07/04 1015) BP: (100-162)/(52-99) 116/68 mmHg (07/04 1015) SpO2:  [93 %-98 %] 98 % (07/04 1015) Weight:  [256 lb 6.4 oz (116.302 kg)] 256 lb 6.4 oz (116.302 kg) (07/03 2125) Last BM Date: 08/24/12  Intake/Output from previous day: 07/03 0701 - 07/04 0700 In: -  Out: 500 [Urine:500] Intake/Output this shift:    PE: Abd: soft, mildly tender left flank/left lower quad, +BS General: awake, NAD  Lab Results:   Recent Labs  08/25/12 1253 08/26/12 0525  WBC 17.4* 12.5*  HGB 14.7 12.4  HCT 42.7 36.8  PLT 270 231   BMET  Recent Labs  08/25/12 1253 08/26/12 0525  NA 139 137  K 3.7 3.5  CL 100 102  CO2 26 23  GLUCOSE 118* 125*  BUN 16 13  CREATININE 0.92 0.88  CALCIUM 9.8 8.9   PT/INR No results found for this basename: LABPROT, INR,  in the last 72 hours CMP     Component Value Date/Time   NA 137 08/26/2012 0525   K 3.5 08/26/2012 0525   CL 102 08/26/2012 0525   CO2 23 08/26/2012 0525   GLUCOSE 125* 08/26/2012 0525   BUN 13 08/26/2012 0525   CREATININE 0.88 08/26/2012 0525   CALCIUM 8.9 08/26/2012 0525   PROT 8.6* 08/25/2012 1253   ALBUMIN 4.2 08/25/2012 1253   AST 22 08/25/2012 1253   ALT 22 08/25/2012 1253   ALKPHOS 93 08/25/2012 1253   BILITOT 0.8 08/25/2012 1253   GFRNONAA 74* 08/26/2012 0525   GFRAA 86* 08/26/2012 0525   Lipase  No results found for this basename: lipase       Studies/Results: Dg Abd 1 View  08/25/2012   *RADIOLOGY REPORT*  Clinical Data: Abdominal pain.  Previous gallbladder surgery.  Low grade fever.  ABDOMEN - 1 VIEW  Comparison: 03/17/2011  Findings: Bowel gas pattern is normal without evidence of ileus, obstruction or detectable free air.  Clips  in the right upper quadrant indicate previous cholecystectomy.  No abnormal calcifications.  Ordinary degenerative changes effect the lumbar spine.  IMPRESSION: Unremarkable supine abdominal radiograph.   Original Report Authenticated By: Paulina Fusi, M.D.   Ct Abdomen Pelvis W Contrast  08/25/2012   *RADIOLOGY REPORT*  Clinical Data: Lower abdominal and abdominal pain.  Nausea.  Fever.  CT ABDOMEN AND PELVIS WITH CONTRAST  Technique:  Multidetector CT imaging of the abdomen and pelvis was performed following the standard protocol during bolus administration of intravenous contrast.  Contrast: OMNIPAQUE IOHEXOL 300 MG/ML  SOLN  Comparison: Radiographs dated 08/25/2012 and CT scan dated 12/09/2008  Findings: The patient has acute sigmoid diverticulitis with pericolonic soft tissue inflammation and soft tissue stranding. There are a few tiny bubbles of air extrinsic to the bowel.  There is a tiny collection of pus along the inferior margin of the involved area measuring at 10 x 6 mm.  There are a few scattered diverticula in the descending portion of the colon.  There is a small bubble of air in the bladder.  Has the patient had recent catheterization?  If not, the possibility of a bladder infection should be considered.  There  is slight hepatomegaly and hepatic steatosis.  No focal liver lesions.  There are multiple small lesions in  the spleen consistent with  small hemangiomas.  Gallbladder has been removed.  Chronic slight prominence of biliary tree.  Pancreas, adrenal glands, and kidneys demonstrate no significant abnormalities.  15 mm cyst on the upper pole of the left kidney.  Terminal ileum and appendix are normal.  Uterus and ovaries are normal.  No acute osseous abnormality.  IMPRESSION: Acute sigmoid diverticulitis with micro perforations and a tiny abscess adjacent to the area. 2.  Small amount of air in the bladder.  If the patient has not had a recent catheterization, bladder infection is likely.    Original Report Authenticated By: Francene Boyers, M.D.    Anti-infectives: Anti-infectives   Start     Dose/Rate Route Frequency Ordered Stop   08/25/12 2200  ciprofloxacin (CIPRO) IVPB 400 mg     400 mg 200 mL/hr over 60 Minutes Intravenous Every 12 hours 08/25/12 2145     08/25/12 2200  metroNIDAZOLE (FLAGYL) IVPB 500 mg     500 mg 100 mL/hr over 60 Minutes Intravenous Every 8 hours 08/25/12 2145     08/25/12 1945  ciprofloxacin (CIPRO) IVPB 400 mg     400 mg 200 mL/hr over 60 Minutes Intravenous  Once 08/25/12 1936     08/25/12 1945  metroNIDAZOLE (FLAGYL) IVPB 500 mg     500 mg 100 mL/hr over 60 Minutes Intravenous  Once 08/25/12 1936         Assessment/Plan Sigmoid diverticulitis with abscess: keep on bowel rest right now, IVFs, cipro/flagyl, ice chips ok, ambulate, continue conservative managment  LOS: 1 day    Jerilyn Gillaspie 08/26/2012

## 2012-08-26 NOTE — Progress Notes (Addendum)
ATTENDING ADDENDUM:  I personally reviewed patient's record, examined the patient, and agree with above.   Pain less Looks calm LLQ pain only Husband in room  The patient is stable.  There is no evidence of peritonitis, acute abdomen, nor shock.  There is no strong evidence of failure of improvement nor decline with current non-operative management.  Cont IV ABx & bowel rest.  There is no need for surgery at the present moment.  We will continue to follow.

## 2012-08-27 NOTE — Progress Notes (Signed)
Pt reported to nurse that she had a semi liquid bm and felt like it came from vagina when she wiped pt denies burning or pain  In peri-anal area

## 2012-08-27 NOTE — Progress Notes (Signed)
General Surgery Note  LOS: 2 days  PCP - [facility] - D. Christella Hartigan  Assessment/Plan: 1.  Diverticulitis, sigmoid colon  On Cipro and Flagyl \ Last colonoscopy about 5 years ago.  She was a little  On ice chips only.  Will advance to clear liquids.   2. DVT prophylaxis - lovenox 3.  Obesity 4.  Anxiety disorder 5.  Hypertension 6.  GERD  Subjective:  About there same as yesterday.  Her husband is in the room.  She has passed flatus, but no BM.  Objective:   Filed Vitals:   08/27/12 0559  BP: 111/67  Pulse: 71  Temp: 98.7 F (37.1 C)  Resp:      Intake/Output from previous day:  07/04 0701 - 07/05 0700 In: 1952 [P.O.:50; I.V.:1902] Out: 1500 [Urine:1500]  Intake/Output this shift:      Physical Exam:   General: Obese WF who is alert and oriented.    HEENT: Normal. Pupils equal. .   Lungs: Clear   Abdomen: Soft.  Obese.  Some tenderness lower midline and LLQ.   Lab Results:    Recent Labs  08/28/12 1253 08/26/12 0525  WBC 17.4* 12.5*  HGB 14.7 12.4  HCT 42.7 36.8  PLT 270 231    BMET   Recent Labs  August 28, 2012 1253 08/26/12 0525  NA 139 137  K 3.7 3.5  CL 100 102  CO2 26 23  GLUCOSE 118* 125*  BUN 16 13  CREATININE 0.92 0.88  CALCIUM 9.8 8.9    PT/INR  No results found for this basename: LABPROT, INR,  in the last 72 hours  ABG  No results found for this basename: PHART, PCO2, PO2, HCO3,  in the last 72 hours   Studies/Results:  Dg Abd 1 View  Aug 28, 2012   *RADIOLOGY REPORT*  Clinical Data: Abdominal pain.  Previous gallbladder surgery.  Low grade fever.  ABDOMEN - 1 VIEW  Comparison: 03/17/2011  Findings: Bowel gas pattern is normal without evidence of ileus, obstruction or detectable free air.  Clips in the right upper quadrant indicate previous cholecystectomy.  No abnormal calcifications.  Ordinary degenerative changes effect the lumbar spine.  IMPRESSION: Unremarkable supine abdominal radiograph.   Original Report Authenticated By: Paulina Fusi,  M.D.   Ct Abdomen Pelvis W Contrast  08/28/12   *RADIOLOGY REPORT*  Clinical Data: Lower abdominal and abdominal pain.  Nausea.  Fever.  CT ABDOMEN AND PELVIS WITH CONTRAST  Technique:  Multidetector CT imaging of the abdomen and pelvis was performed following the standard protocol during bolus administration of intravenous contrast.  Contrast: OMNIPAQUE IOHEXOL 300 MG/ML  SOLN  Comparison: Radiographs dated 08/28/12 and CT scan dated 12/09/2008  Findings: The patient has acute sigmoid diverticulitis with pericolonic soft tissue inflammation and soft tissue stranding. There are a few tiny bubbles of air extrinsic to the bowel.  There is a tiny collection of pus along the inferior margin of the involved area measuring at 10 x 6 mm.  There are a few scattered diverticula in the descending portion of the colon.  There is a small bubble of air in the bladder.  Has the patient had recent catheterization?  If not, the possibility of a bladder infection should be considered.  There is slight hepatomegaly and hepatic steatosis.  No focal liver lesions.  There are multiple small lesions in  the spleen consistent with  small hemangiomas.  Gallbladder has been removed.  Chronic slight prominence of biliary tree.  Pancreas, adrenal  glands, and kidneys demonstrate no significant abnormalities.  15 mm cyst on the upper pole of the left kidney.  Terminal ileum and appendix are normal.  Uterus and ovaries are normal.  No acute osseous abnormality.  IMPRESSION: Acute sigmoid diverticulitis with micro perforations and a tiny abscess adjacent to the area. 2.  Small amount of air in the bladder.  If the patient has not had a recent catheterization, bladder infection is likely.   Original Report Authenticated By: Francene Boyers, M.D.     Anti-infectives:   Anti-infectives   Start     Dose/Rate Route Frequency Ordered Stop   08/25/12 2200  ciprofloxacin (CIPRO) IVPB 400 mg     400 mg 200 mL/hr over 60 Minutes  Intravenous Every 12 hours 08/25/12 2145     08/25/12 2200  metroNIDAZOLE (FLAGYL) IVPB 500 mg     500 mg 100 mL/hr over 60 Minutes Intravenous Every 8 hours 08/25/12 2145     08/25/12 1945  ciprofloxacin (CIPRO) IVPB 400 mg     400 mg 200 mL/hr over 60 Minutes Intravenous  Once 08/25/12 1936     08/25/12 1945  metroNIDAZOLE (FLAGYL) IVPB 500 mg     500 mg 100 mL/hr over 60 Minutes Intravenous  Once 08/25/12 1936        Ovidio Kin, MD, FACS Pager: [phone],   [facility]: [phone] 08/27/2012

## 2012-08-28 LAB — CBC WITH DIFFERENTIAL/PLATELET
Eosinophils Relative: 2 % (ref 0–5)
Lymphocytes Relative: 12 % (ref 12–46)
Monocytes Relative: 11 % (ref 3–12)
Neutrophils Relative %: 75 % (ref 43–77)
Platelets: 247 10*3/uL (ref 150–400)
RBC: 3.86 MIL/uL — ABNORMAL LOW (ref 3.87–5.11)
WBC: 9.2 10*3/uL (ref 4.0–10.5)

## 2012-08-28 NOTE — Progress Notes (Signed)
General Surgery Note  LOS: 3 days  PCP - [facility] - D. Christella Hartigan  Assessment/Plan: 1.  Diverticulitis, sigmoid colon  On Cipro and Flagyl  Last colonoscopy about 5 years ago.  Will advance to low fiber diet.  Progressing well.  2.  DVT prophylaxis - lovenox 3.  Obesity 4.  Anxiety disorder 5.  Hypertension 6.  GERD  Subjective:  Her husband is in the room.  Clearly doing better.  Had small BM.  No abdominal pain.  Tolerated liquids.  Objective:   Filed Vitals:   08/28/12 0553  BP: 135/76  Pulse: 61  Temp: 97.9 F (36.6 C)  Resp: 18     Intake/Output from previous day:  07/05 0701 - 07/06 0700 In: 2919 [I.V.:2919] Out: 1500 [Urine:1500]  Intake/Output this shift:      Physical Exam:   General: Obese WF who is alert and oriented.    HEENT: Normal. Pupils equal. .   Lungs: Clear   Abdomen: Soft.  Obese.  No tenderness now.  Lab Results:     Recent Labs  08/26/12 0525 08/28/12 0430  WBC 12.5* 9.2  HGB 12.4 11.7*  HCT 36.8 34.8*  PLT 231 247    BMET    Recent Labs  08/25/12 1253 08/26/12 0525  NA 139 137  K 3.7 3.5  CL 100 102  CO2 26 23  GLUCOSE 118* 125*  BUN 16 13  CREATININE 0.92 0.88  CALCIUM 9.8 8.9    PT/INR  No results found for this basename: LABPROT, INR,  in the last 72 hours  ABG  No results found for this basename: PHART, PCO2, PO2, HCO3,  in the last 72 hours   Studies/Results:  No results found.   Anti-infectives:   Anti-infectives   Start     Dose/Rate Route Frequency Ordered Stop   08/25/12 2200  ciprofloxacin (CIPRO) IVPB 400 mg     400 mg 200 mL/hr over 60 Minutes Intravenous Every 12 hours 08/25/12 2145     08/25/12 2200  metroNIDAZOLE (FLAGYL) IVPB 500 mg     500 mg 100 mL/hr over 60 Minutes Intravenous Every 8 hours 08/25/12 2145     08/25/12 1945  ciprofloxacin (CIPRO) IVPB 400 mg     400 mg 200 mL/hr over 60 Minutes Intravenous  Once 08/25/12 1936     08/25/12 1945  metroNIDAZOLE (FLAGYL) IVPB 500 mg     500 mg 100 mL/hr over 60 Minutes Intravenous  Once 08/25/12 1936        Ovidio Kin, MD, FACS Pager: [phone],   [facility]: [phone] 08/28/2012

## 2012-08-29 MED ORDER — ONDANSETRON HCL 4 MG PO TABS: 4.0000 mg | ORAL_TABLET | Freq: Three times a day (TID) | ORAL | Status: DC | PRN

## 2012-08-29 MED ORDER — METRONIDAZOLE 500 MG PO TABS: 500.0000 mg | ORAL_TABLET | Freq: Three times a day (TID) | ORAL | Status: DC

## 2012-08-29 MED ORDER — CIPROFLOXACIN HCL 500 MG PO TABS: 500.0000 mg | ORAL_TABLET | Freq: Two times a day (BID) | ORAL | Status: DC

## 2012-08-29 NOTE — Discharge Summary (Signed)
Physician Discharge Summary  Patient ID:  [name] [name]  MRN: [id number]  DOB/AGE: 1960/04/02 52 y.o.  Admit date: 08/25/2012 Discharge date: 08/29/2012  Discharge Diagnoses:  1.    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 GERD 3. Obesity  4. Anxiety disorder  5. Hypertension  Operation:  None  Discharged Condition: good  Hospital Course: PEYTEN [name] is an 52 y.o. female whose primary care physician is [name] Pounds, MD and who was admitted 08/25/2012 with a chief complaint of abdominal pain.  On CT scan she was found to have diverticulitis with a micro perforation.  Dr. Janee [name] admitted her for IV antibiotics and bowel rest.  She has done well. Her pain resolved and she is tolerating low residue diet.  She is now ready for discharge.  The discharge instructions were reviewed with the patient.  Consults: None  Significant Diagnostic Studies: Results for orders placed during the hospital encounter of 08/25/12  CBC WITH DIFFERENTIAL      Result Value Range   WBC 17.4 (*) 4.0 - 10.5 K/uL   RBC 4.84  3.87 - 5.11 MIL/uL   Hemoglobin 14.7  12.0 - 15.0 g/dL   HCT 08.6  57.8 - 46.9 %   MCV 88.2  78.0 - 100.0 fL   MCH 30.4  26.0 - 34.0 pg   MCHC 34.4  30.0 - 36.0 g/dL   RDW 62.9  52.8 - 41.3 %   Platelets 270  150 - 400 K/uL   Neutrophils Relative % 87 (*) 43 - 77 %   Neutro Abs 15.1 (*) 1.7 - 7.7 K/uL   Lymphocytes Relative 8 (*) 12 - 46 %   Lymphs Abs 1.3  0.7 - 4.0 K/uL   Monocytes Relative 6  3 - 12 %   Monocytes Absolute 1.0  0.1 - 1.0 K/uL   Eosinophils Relative 0  0 - 5 %   Eosinophils Absolute 0.0  0.0 - 0.7 K/uL   Basophils Relative 0  0 - 1 %   Basophils Absolute 0.0  0.0 - 0.1 K/uL  COMPREHENSIVE METABOLIC PANEL      Result Value Range   Sodium 139  135 - 145 mEq/L   Potassium 3.7  3.5 - 5.1 mEq/L   Chloride 100  96 - 112 mEq/L   CO2 26  19 - 32 mEq/L   Glucose, Bld 118 (*) 70 - 99 mg/dL   BUN 16  6 - 23 mg/dL   Creatinine, Ser  2.44  0.50 - 1.10 mg/dL   Calcium 9.8  8.4 - 01.0 mg/dL   Total Protein 8.6 (*) 6.0 - 8.3 g/dL   Albumin 4.2  3.5 - 5.2 g/dL   AST 22  0 - 37 U/L   ALT 22  0 - 35 U/L   Alkaline Phosphatase 93  39 - 117 U/L   Total Bilirubin 0.8  0.3 - 1.2 mg/dL   GFR calc non Af Amer 70 (*) >90 mL/min   GFR calc Af Amer 82 (*) >90 mL/min  URINALYSIS, ROUTINE W REFLEX MICROSCOPIC      Result Value Range   Color, Urine AMBER (*) YELLOW   APPearance HAZY (*) CLEAR   Specific Gravity, Urine 1.023  1.005 - 1.030   pH 6.0  5.0 - 8.0   Glucose, UA NEGATIVE  NEGATIVE mg/dL   Hgb urine dipstick NEGATIVE  NEGATIVE   Bilirubin Urine SMALL (*) NEGATIVE   Ketones, ur NEGATIVE  NEGATIVE mg/dL  Protein, ur NEGATIVE  NEGATIVE mg/dL   Urobilinogen, UA 0.2  0.0 - 1.0 mg/dL   Nitrite NEGATIVE  NEGATIVE   Leukocytes, UA NEGATIVE  NEGATIVE  BASIC METABOLIC PANEL      Result Value Range   Sodium 137  135 - 145 mEq/L   Potassium 3.5  3.5 - 5.1 mEq/L   Chloride 102  96 - 112 mEq/L   CO2 23  19 - 32 mEq/L   Glucose, Bld 125 (*) 70 - 99 mg/dL   BUN 13  6 - 23 mg/dL   Creatinine, Ser 4.09  0.50 - 1.10 mg/dL   Calcium 8.9  8.4 - 81.1 mg/dL   GFR calc non Af Amer 74 (*) >90 mL/min   GFR calc Af Amer 86 (*) >90 mL/min  CBC      Result Value Range   WBC 12.5 (*) 4.0 - 10.5 K/uL   RBC 4.13  3.87 - 5.11 MIL/uL   Hemoglobin 12.4  12.0 - 15.0 g/dL   HCT 91.4  78.2 - 95.6 %   MCV 89.1  78.0 - 100.0 fL   MCH 30.0  26.0 - 34.0 pg   MCHC 33.7  30.0 - 36.0 g/dL   RDW 21.3  08.6 - 57.8 %   Platelets 231  150 - 400 K/uL  CBC WITH DIFFERENTIAL      Result Value Range   WBC 9.2  4.0 - 10.5 K/uL   RBC 3.86 (*) 3.87 - 5.11 MIL/uL   Hemoglobin 11.7 (*) 12.0 - 15.0 g/dL   HCT 46.9 (*) 62.9 - 52.8 %   MCV 90.2  78.0 - 100.0 fL   MCH 30.3  26.0 - 34.0 pg   MCHC 33.6  30.0 - 36.0 g/dL   RDW 41.3  24.4 - 01.0 %   Platelets 247  150 - 400 K/uL   Neutrophils Relative % 75  43 - 77 %   Lymphocytes Relative 12  12 - 46 %    Monocytes Relative 11  3 - 12 %   Eosinophils Relative 2  0 - 5 %   Basophils Relative 0  0 - 1 %   Neutro Abs 6.9  1.7 - 7.7 K/uL   Lymphs Abs 1.1  0.7 - 4.0 K/uL   Monocytes Absolute 1.0  0.1 - 1.0 K/uL   Eosinophils Absolute 0.2  0.0 - 0.7 K/uL   Basophils Absolute 0.0  0.0 - 0.1 K/uL   Smear Review LARGE PLATELETS PRESENT      Dg Abd 1 View  08/25/2012   *RADIOLOGY REPORT*  Clinical Data: Abdominal pain.  Previous gallbladder surgery.  Low grade fever.  ABDOMEN - 1 VIEW  Comparison: 03/17/2011  Findings: Bowel gas pattern is normal without evidence of ileus, obstruction or detectable free air.  Clips in the right upper quadrant indicate previous cholecystectomy.  No abnormal calcifications.  Ordinary degenerative changes effect the lumbar spine.  IMPRESSION: Unremarkable supine abdominal radiograph.   Original Report Authenticated By: Paulina Fusi, M.D.   Ct Abdomen Pelvis W Contrast  08/25/2012   *RADIOLOGY REPORT*  Clinical Data: Lower abdominal and abdominal pain.  Nausea.  Fever.  CT ABDOMEN AND PELVIS WITH CONTRAST  Technique:  Multidetector CT imaging of the abdomen and pelvis was performed following the standard protocol during bolus administration of intravenous contrast.  Contrast: OMNIPAQUE IOHEXOL 300 MG/ML  SOLN  Comparison: Radiographs dated 08/25/2012 and CT scan dated 12/09/2008  Findings: The patient has acute sigmoid diverticulitis  with pericolonic soft tissue inflammation and soft tissue stranding. There are a few tiny bubbles of air extrinsic to the bowel.  There is a tiny collection of pus along the inferior margin of the involved area measuring at 10 x 6 mm.  There are a few scattered diverticula in the descending portion of the colon.  There is a small bubble of air in the bladder.  Has the patient had recent catheterization?  If not, the possibility of a bladder infection should be considered.  There is slight hepatomegaly and hepatic steatosis.  No focal liver lesions.   There are multiple small lesions in  the spleen consistent with  small hemangiomas.  Gallbladder has been removed.  Chronic slight prominence of biliary tree.  Pancreas, adrenal glands, and kidneys demonstrate no significant abnormalities.  15 mm cyst on the upper pole of the left kidney.  Terminal ileum and appendix are normal.  Uterus and ovaries are normal.  No acute osseous abnormality.  IMPRESSION: Acute sigmoid diverticulitis with micro perforations and a tiny abscess adjacent to the area. 2.  Small amount of air in the bladder.  If the patient has not had a recent catheterization, bladder infection is likely.   Original Report Authenticated By: Francene Boyers, M.D.    Discharge Exam:  Filed Vitals:   08/29/12 0541  BP: 110/64  Pulse: 61  Temp: 98.1 F (36.7 C)  Resp: 18    General: WN obese WF who is alert and generally healthy appearing.  Lungs: Clear to auscultation and symmetric breath sounds. Heart:  RRR. No murmur or rub. Abdomen: Soft. No mass. No tenderness. No hernia. Normal bowel sounds. Obese.  Discharge Medications:     Medication List         aspirin-acetaminophen-caffeine 250-250-65 MG per tablet  Commonly known as:  EXCEDRIN MIGRAINE  Take 1 tablet by mouth daily as needed for pain.     ciprofloxacin 500 MG tablet  Commonly known as:  CIPRO  Take 1 tablet (500 mg total) by mouth 2 (two) times daily.     metroNIDAZOLE 500 MG tablet  Commonly known as:  FLAGYL  Take 1 tablet (500 mg total) by mouth 3 (three) times daily.     multivitamin with minerals Tabs  Take 1 tablet by mouth daily.     omeprazole 40 MG capsule  Commonly known as:  PRILOSEC  Take 40 mg by mouth daily with breakfast.     ondansetron 4 MG tablet  Commonly known as:  ZOFRAN  Take 1 tablet (4 mg total) by mouth every 8 (eight) hours as needed for nausea.     sertraline 100 MG tablet  Commonly known as:  ZOLOFT  Take 150 mg by mouth daily.     triamterene-hydrochlorothiazide 37.5-25  MG per tablet  Commonly known as:  MAXZIDE-25  Take 1 tablet by mouth daily.        Disposition: 04-Intermediate Care Facility      Discharge Orders   Future Orders Complete By Expires     Diet - low sodium heart healthy  As directed     Increase activity slowly  As directed       Activity:  Driving - May drive when you feel okay.   Lifting - No limit  Diet:  Low residue diet for one month  Follow up appointment:  Call Dr. Carollee Massed office [facility]) at [phone] for an appointment in about 3 weeks.  Medications and dosages:  Resume your home  medications.  You have a prescription for:  Cipro and Flagyl for 10 days, Zofran for nausea.   Signed: Ovidio Kin, M.D., FACS Office:  [phone]  08/29/2012, 8:38 AM

## 2012-08-29 NOTE — Progress Notes (Signed)
Discharge Note. Discharge instructions were reviewed with pt and pt's husband. Pt did want to stay the next hour to get in her last dose of IV Cipro before going home. Medication education done, all medications reviewed. Pt ready for discharge.

## 2012-09-14 ENCOUNTER — Encounter (INDEPENDENT_AMBULATORY_CARE_PROVIDER_SITE_OTHER): Payer: Self-pay | Admitting: General Surgery

## 2012-09-14 ENCOUNTER — Ambulatory Visit (INDEPENDENT_AMBULATORY_CARE_PROVIDER_SITE_OTHER): Payer: BC Managed Care – PPO | Admitting: General Surgery

## 2012-09-14 VITALS — BP 138/80 | HR 68 | Temp 98.5°F | Resp 14 | Ht 65.0 in | Wt 246.2 lb

## 2012-09-14 DIAGNOSIS — K5730 Diverticulosis of large intestine without perforation or abscess without bleeding: Secondary | ICD-10-CM

## 2012-09-14 DIAGNOSIS — K5780 Diverticulitis of intestine, part unspecified, with perforation and abscess without bleeding: Secondary | ICD-10-CM

## 2012-09-14 DIAGNOSIS — K630 Abscess of intestine: Secondary | ICD-10-CM

## 2012-09-14 NOTE — Patient Instructions (Signed)
Complete your antibiotics. Continue low fiber diet for 6 weeks.

## 2012-09-14 NOTE — Progress Notes (Signed)
Subjective:     Patient ID: [name] [name], female   DOB: September 25, 1960, 52 y.o.   MRN: [id number]  HPI Patient is a followup status post admission to the hospital for sigmoid diverticulitis. This was treated medically with intravenous antibiotics and bowel rest. She is doing much better. She is completing a course of Cipro and Flagyl at home. Her pain is nearly resolved. She gets a little bit of cramping with bowel movements but otherwise is pain-free. Stools have been soft. No fevers and no diarrhea. No blood in her stool.  Review of Systems     Objective:   Physical Exam  Constitutional: She is oriented to person, place, and time. She appears well-developed and well-nourished.  HENT:  Head: Normocephalic and atraumatic.  Neck: Normal range of motion. Neck supple.  Cardiovascular: Normal rate and normal heart sounds.   Pulmonary/Chest: No respiratory distress. She has no wheezes.  Abdominal: Soft. She exhibits no distension. There is no rebound and no guarding.  Minimal tenderness left lower quadrant and suprapubic region but no mass palpable  Musculoskeletal: Normal range of motion. She exhibits no edema.  Neurological: She is alert and oriented to person, place, and time.  Skin: Skin is warm.  Psychiatric: She has a normal mood and affect.       Assessment:     Sigmoid diverticulitis with significant improvement on antibiotics    Plan:     This has been her first episode. I recommend completing her course of antibiotics. Low fiber diet for the next 6 weeks. I will see her back in 3 months. This being her first episode, she may not need sigmoid colectomy. We will see her symptoms go in the next few months. We will discuss things further on her followup. Diet instructions were given.

## 2012-11-02 ENCOUNTER — Encounter (INDEPENDENT_AMBULATORY_CARE_PROVIDER_SITE_OTHER): Payer: Self-pay | Admitting: General Surgery

## 2013-02-15 ENCOUNTER — Encounter: Payer: Self-pay | Admitting: Internal Medicine

## 2013-05-09 ENCOUNTER — Encounter: Payer: Self-pay | Admitting: Gastroenterology

## 2013-06-23 ENCOUNTER — Ambulatory Visit: Payer: BC Managed Care – PPO | Admitting: Gastroenterology

## 2013-08-17 ENCOUNTER — Ambulatory Visit (INDEPENDENT_AMBULATORY_CARE_PROVIDER_SITE_OTHER): Payer: BC Managed Care – PPO | Admitting: Podiatry

## 2013-08-17 ENCOUNTER — Ambulatory Visit (INDEPENDENT_AMBULATORY_CARE_PROVIDER_SITE_OTHER): Payer: BC Managed Care – PPO

## 2013-08-17 ENCOUNTER — Other Ambulatory Visit: Payer: Self-pay | Admitting: *Deleted

## 2013-08-17 VITALS — BP 136/90 | HR 62 | Resp 16 | Ht 65.0 in | Wt 223.0 lb

## 2013-08-17 DIAGNOSIS — M722 Plantar fascial fibromatosis: Secondary | ICD-10-CM | POA: Diagnosis not present

## 2013-08-17 MED ORDER — METHYLPREDNISOLONE (PAK) 4 MG PO TABS: ORAL_TABLET | ORAL | Status: DC

## 2013-08-17 MED ORDER — MELOXICAM 15 MG PO TABS: 15.0000 mg | ORAL_TABLET | Freq: Every day | ORAL | Status: DC

## 2013-08-17 NOTE — Patient Instructions (Signed)
Plantar Fasciitis  Plantar fasciitis is a common condition that causes foot pain. It is soreness (inflammation) of the band of tough fibrous tissue on the bottom of the foot that runs from the heel bone (calcaneus) to the ball of the foot. The cause of this soreness may be from excessive standing, poor fitting shoes, running on hard surfaces, being overweight, having an abnormal walk, or overuse (this is common in runners) of the painful foot or feet. It is also common in aerobic exercise dancers and ballet dancers.  SYMPTOMS   Most people with plantar fasciitis complain of:   Severe pain in the morning on the bottom of their foot especially when taking the first steps out of bed. This pain recedes after a few minutes of walking.   Severe pain is experienced also during walking following a long period of inactivity.   Pain is worse when walking barefoot or up stairs  DIAGNOSIS    Your caregiver will diagnose this condition by examining and feeling your foot.   Special tests such as X-rays of your foot, are usually not needed.  PREVENTION    Consult a sports medicine professional before beginning a new exercise program.   Walking programs offer a good workout. With walking there is a lower chance of overuse injuries common to runners. There is less impact and less jarring of the joints.   Begin all new exercise programs slowly. If problems or pain develop, decrease the amount of time or distance until you are at a comfortable level.   Wear good shoes and replace them regularly.   Stretch your foot and the heel cords at the back of the ankle (Achilles tendon) both before and after exercise.   Run or exercise on even surfaces that are not hard. For example, asphalt is better than pavement.   Do not run barefoot on hard surfaces.   If using a treadmill, vary the incline.   Do not continue to workout if you have foot or joint problems. Seek professional help if they do not improve.  HOME CARE INSTRUCTIONS     Avoid activities that cause you pain until you recover.   Use ice or cold packs on the problem or painful areas after working out.   Only take over-the-counter or prescription medicines for pain, discomfort, or fever as directed by your caregiver.   Soft shoe inserts or athletic shoes with air or gel sole cushions may be helpful.   If problems continue or become more severe, consult a sports medicine caregiver or your own health care provider. Cortisone is a potent anti-inflammatory medication that may be injected into the painful area. You can discuss this treatment with your caregiver.  MAKE SURE YOU:    Understand these instructions.   Will watch your condition.   Will get help right away if you are not doing well or get worse.  Document Released: 11/04/2000 Document Revised: 05/04/2011 Document Reviewed: 01/04/2008  ExitCare Patient Information 2015 [employer]. This information is not intended to replace advice given to you by your health care provider. Make sure you discuss any questions you have with your health care provider.

## 2013-08-17 NOTE — Progress Notes (Signed)
   Subjective:    Patient ID: [name] [name], female    DOB: [birth year], 53 y.o.   MRN: [id number]  HPI Comments: For about 3 weeks now having some heel pain in the right foot, it is in the medial side of heel and arch and goes up the side of the foot   Foot Pain      Review of Systems     Objective:   Physical Exam I have reviewed her past medical history medications allergies surgeries social history and review of systems. Pulses are palpable bilateral. Neurologic sensorium is intact per since once the monofilament. Deep tendon reflexes are intact bilateral muscle strength is 5 over 5 dorsiflexors plantar flexors inverters everters all intrinsic musculature appears to be intact. Orthopedic evaluation Mr. is pain on palpation medial continued tubercle of the right heel. Radiographic evaluation demonstrates plantar distally oriented calcaneal heel spur with soft tissue increase in density at the plantar fascial calcaneal insertion site indicative of plantar fasciitis.          Assessment & Plan:  Assessment: Plantar fasciitis right foot.  Plan: Injected her right heel today put her in a plantar fascial strapping also a plantar fascial night splint. Discussed shoe gear stretching sizes ice therapy shoe gear modifications started her on Medrol Dosepak to be followed by Monday. Discussed appropriate shoe gear L. followup with her in one month

## 2013-09-07 ENCOUNTER — Ambulatory Visit: Payer: BC Managed Care – PPO | Admitting: Podiatry

## 2013-12-04 ENCOUNTER — Other Ambulatory Visit: Payer: Self-pay | Admitting: Podiatry

## 2014-08-03 ENCOUNTER — Encounter: Payer: Self-pay | Admitting: Gastroenterology

## 2015-01-07 ENCOUNTER — Other Ambulatory Visit (HOSPITAL_COMMUNITY): Payer: Self-pay | Admitting: Adult Health

## 2015-01-07 ENCOUNTER — Ambulatory Visit (HOSPITAL_COMMUNITY)
Admission: RE | Admit: 2015-01-07 | Discharge: 2015-01-07 | Disposition: A | Discharge status: Home / Self Care | Payer: BC Managed Care – PPO | Source: Ambulatory Visit | Attending: Surgery | Admitting: Surgery

## 2015-01-07 DIAGNOSIS — M79605 Pain in left leg: Secondary | ICD-10-CM

## 2015-01-07 NOTE — Progress Notes (Signed)
Preliminary report called to Reginold Agent NP at [place]

## 2015-05-26 ENCOUNTER — Emergency Department (HOSPITAL_COMMUNITY)
Admission: EM | Admit: 2015-05-26 | Discharge: 2015-05-27 | Disposition: A | Discharge status: Home / Self Care | Payer: BC Managed Care – PPO | Attending: Physician Assistant | Admitting: Physician Assistant

## 2015-05-26 ENCOUNTER — Encounter (HOSPITAL_COMMUNITY): Payer: Self-pay

## 2015-05-26 ENCOUNTER — Emergency Department (HOSPITAL_COMMUNITY): Payer: BC Managed Care – PPO

## 2015-05-26 DIAGNOSIS — K5792 Diverticulitis of intestine, part unspecified, without perforation or abscess without bleeding: Secondary | ICD-10-CM | POA: Diagnosis not present

## 2015-05-26 DIAGNOSIS — K219 Gastro-esophageal reflux disease without esophagitis: Secondary | ICD-10-CM | POA: Insufficient documentation

## 2015-05-26 DIAGNOSIS — R1032 Left lower quadrant pain: Secondary | ICD-10-CM | POA: Diagnosis present

## 2015-05-26 DIAGNOSIS — I10 Essential (primary) hypertension: Secondary | ICD-10-CM | POA: Diagnosis not present

## 2015-05-26 DIAGNOSIS — Z9049 Acquired absence of other specified parts of digestive tract: Secondary | ICD-10-CM | POA: Diagnosis not present

## 2015-05-26 DIAGNOSIS — E785 Hyperlipidemia, unspecified: Secondary | ICD-10-CM | POA: Diagnosis not present

## 2015-05-26 DIAGNOSIS — E669 Obesity, unspecified: Secondary | ICD-10-CM | POA: Diagnosis not present

## 2015-05-26 DIAGNOSIS — F419 Anxiety disorder, unspecified: Secondary | ICD-10-CM | POA: Insufficient documentation

## 2015-05-26 DIAGNOSIS — Z7951 Long term (current) use of inhaled steroids: Secondary | ICD-10-CM | POA: Insufficient documentation

## 2015-05-26 DIAGNOSIS — Z79899 Other long term (current) drug therapy: Secondary | ICD-10-CM | POA: Diagnosis not present

## 2015-05-26 LAB — CBC WITH DIFFERENTIAL/PLATELET
Basophils Absolute: 0 10*3/uL (ref 0.0–0.1)
Basophils Relative: 0 %
EOS PCT: 1 %
Eosinophils Absolute: 0.1 10*3/uL (ref 0.0–0.7)
HCT: 40.5 % (ref 36.0–46.0)
Hemoglobin: 13.7 g/dL (ref 12.0–15.0)
LYMPHS ABS: 1.9 10*3/uL (ref 0.7–4.0)
LYMPHS PCT: 18 %
MCH: 29.3 pg (ref 26.0–34.0)
MCHC: 33.8 g/dL (ref 30.0–36.0)
MCV: 86.7 fL (ref 78.0–100.0)
MONO ABS: 0.9 10*3/uL (ref 0.1–1.0)
Monocytes Relative: 9 %
Neutro Abs: 7.7 10*3/uL (ref 1.7–7.7)
Neutrophils Relative %: 72 %
PLATELETS: 258 10*3/uL (ref 150–400)
RBC: 4.67 MIL/uL (ref 3.87–5.11)
RDW: 13.7 % (ref 11.5–15.5)
WBC: 10.5 10*3/uL (ref 4.0–10.5)

## 2015-05-26 LAB — COMPREHENSIVE METABOLIC PANEL
ALBUMIN: 4.6 g/dL (ref 3.5–5.0)
ALT: 23 U/L (ref 14–54)
AST: 21 U/L (ref 15–41)
Alkaline Phosphatase: 85 U/L (ref 38–126)
Anion gap: 11 (ref 5–15)
BUN: 20 mg/dL (ref 6–20)
CHLORIDE: 102 mmol/L (ref 101–111)
CO2: 27 mmol/L (ref 22–32)
CREATININE: 1.06 mg/dL — AB (ref 0.44–1.00)
Calcium: 9.7 mg/dL (ref 8.9–10.3)
GFR calc Af Amer: >60 mL/min (ref >60)
GFR calc non Af Amer: 58 mL/min — ABNORMAL LOW (ref >60)
Glucose, Bld: 108 mg/dL — ABNORMAL HIGH (ref 65–99)
Potassium: 3.8 mmol/L (ref 3.5–5.1)
SODIUM: 140 mmol/L (ref 135–145)
Total Bilirubin: 1 mg/dL (ref 0.3–1.2)
Total Protein: 8.9 g/dL — ABNORMAL HIGH (ref 6.5–8.1)

## 2015-05-26 LAB — URINALYSIS, ROUTINE W REFLEX MICROSCOPIC
Bilirubin Urine: NEGATIVE
GLUCOSE, UA: NEGATIVE mg/dL
HGB URINE DIPSTICK: NEGATIVE
Ketones, ur: NEGATIVE mg/dL
Leukocytes, UA: NEGATIVE
Nitrite: NEGATIVE
Protein, ur: NEGATIVE mg/dL
SPECIFIC GRAVITY, URINE: 1.017 (ref 1.005–1.030)
pH: 6.5 (ref 5.0–8.0)

## 2015-05-26 LAB — CBC
HCT: 42.6 % (ref 36.0–46.0)
Hemoglobin: 14.7 g/dL (ref 12.0–15.0)
MCH: 29.9 pg (ref 26.0–34.0)
MCHC: 34.5 g/dL (ref 30.0–36.0)
MCV: 86.6 fL (ref 78.0–100.0)
PLATELETS: 277 10*3/uL (ref 150–400)
RBC: 4.92 MIL/uL (ref 3.87–5.11)
RDW: 13.7 % (ref 11.5–15.5)
WBC: 11.6 10*3/uL — ABNORMAL HIGH (ref 4.0–10.5)

## 2015-05-26 LAB — LIPASE, BLOOD: LIPASE: 29 U/L (ref 11–51)

## 2015-05-26 MED ORDER — SODIUM CHLORIDE 0.9 % IV BOLUS (SEPSIS)
1000.0000 mL | Freq: Once | INTRAVENOUS | Status: AC
  Administered 2015-05-26: 1000 mL via INTRAVENOUS

## 2015-05-26 MED ORDER — ONDANSETRON HCL 4 MG PO TABS: 4.0000 mg | ORAL_TABLET | Freq: Four times a day (QID) | ORAL | Status: DC

## 2015-05-26 MED ORDER — METRONIDAZOLE IN NACL 5-0.79 MG/ML-% IV SOLN
500.0000 mg | Freq: Once | INTRAVENOUS | Status: AC
  Administered 2015-05-26: 500 mg via INTRAVENOUS
  Filled 2015-05-26: qty 100

## 2015-05-26 MED ORDER — CIPROFLOXACIN HCL 500 MG PO TABS: 500.0000 mg | ORAL_TABLET | Freq: Two times a day (BID) | ORAL | Status: DC

## 2015-05-26 MED ORDER — IOHEXOL 300 MG/ML  SOLN
25.0000 mL | Freq: Once | INTRAMUSCULAR | Status: AC | PRN
  Administered 2015-05-26: 25 mL via ORAL

## 2015-05-26 MED ORDER — METRONIDAZOLE 500 MG PO TABS: 500.0000 mg | ORAL_TABLET | Freq: Three times a day (TID) | ORAL | Status: DC

## 2015-05-26 MED ORDER — OXYCODONE-ACETAMINOPHEN 5-325 MG PO TABS: 2.0000 | ORAL_TABLET | ORAL | Status: DC | PRN

## 2015-05-26 MED ORDER — CIPROFLOXACIN IN D5W 400 MG/200ML IV SOLN
400.0000 mg | Freq: Once | INTRAVENOUS | Status: AC
  Administered 2015-05-27: 400 mg via INTRAVENOUS
  Filled 2015-05-26: qty 200

## 2015-05-26 NOTE — ED Notes (Signed)
She c/o llq area abd. Pain, plus several small amt. Diarrhea stools x 2-3 days.  She states she has had a sm. Amt. Of blood in one (only one) of her stools.  She tells [place] she has hx of diverticulitis with hx of intra abdominal abscess drainage.

## 2015-05-26 NOTE — ED Provider Notes (Signed)
CSN: [id number]     Arrival date & time 05/26/15  1504 History   First MD Initiated Contact with Patient 05/26/15 1937     Chief Complaint  Patient presents with  . Abdominal Pain   Patient is a 55 y.o. female presenting with abdominal pain.  Abdominal Pain Pain location:  LLQ Pain quality: aching   Pain radiates to:  Does not radiate Pain severity:  Mild Onset quality:  Gradual Duration:  2 days Timing:  Constant Progression:  Worsening Relieved by:  Acetaminophen Worsened by:  Urination (Sitting) Ineffective treatments:  Acetaminophen Associated symptoms: no diarrhea, no dysuria, no fever, no hematuria, no melena, no nausea, no vaginal discharge and no vomiting   Risk factors comment:  Hx of diverticulitis  Ms. Baydoun is a 55 year old female presenting with abdominal pain. Onset of pain was 2 days ago. Pain is located in the left lower quadrant of the abdomen. She describes it as aching and sharp. The pain was initially intermittent but has become constant over the past. It is exacerbated by sitting and palpation. Laying flat somewhat relieves the pain. She has taken Tylenol with a moderate amount of improvement. She reports one episode of bright red blood in her stool. She states she has a history of hemorrhoids and is unsure if the blood is from hemorrhoids. She also complains of increased frequency of urination. She notes that urinating also exacerbates the pain. She has history of diverticulitis 3 years ago requiring hospitalization. Denies fevers, chills, dizziness, syncope, nausea, vomiting, diarrhea, dysuria or vaginal discharge.  Past Medical History  Diagnosis Date  . Obesity   . Anxiety disorder   . Gallstones   . Hyperlipidemia   . Hypertension   . GERD (gastroesophageal reflux disease)    Past Surgical History  Procedure Laterality Date  . Cholecystectomy    . Tonsillectomy      as child-tonsils and adenoids  . Uterine ablation  2003  . 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 Cesarean section     Family History  Problem Relation Age of Onset  . Colon cancer Neg Hx   . Hypertension Mother   . Bone cancer Father   . Bladder Cancer Father    Social History  Substance Use Topics  . Smoking status: Never Smoker   . Smokeless tobacco: Never Used  . Alcohol Use: Yes     Comment: occassionally   OB History    No data available     Review of Systems  Constitutional: Negative for fever.  Gastrointestinal: Positive for abdominal pain. Negative for nausea, vomiting, diarrhea and melena.  Genitourinary: Negative for dysuria, hematuria and vaginal discharge.  All other systems reviewed and are negative.     Allergies  Dilaudid and Quinapril hcl  Home Medications   Prior to Admission medications   Medication Sig Start Date End Date Taking? Authorizing Provider  Cholecalciferol (VITAMIN D3) 1000 units CAPS Take 1 capsule by mouth daily.   Yes Historical Provider, MD  fluticasone Asencion Islam) 50 MCG/ACT nasal spray  07/10/13  Yes Historical Provider, MD  pantoprazole (PROTONIX) 40 MG tablet Take 40 mg by mouth daily.   Yes Historical Provider, MD  rOPINIRole (REQUIP) 0.25 MG tablet Take 1 tablet by mouth daily. 05/05/15  Yes Historical Provider, MD  sertraline (ZOLOFT) 100 MG tablet Take 150 mg by mouth daily.  Yes Historical Provider, MD  triamterene-hydrochlorothiazide (MAXZIDE-25) 37.5-25 MG per tablet Take 1 tablet by mouth daily.   Yes Historical Provider, MD  ciprofloxacin (CIPRO) 500 MG tablet Take 1 tablet (500 mg total) by mouth 2 (two) times daily. 05/26/15   Darryel Diodato, PA-C  meloxicam (MOBIC) 15 MG tablet TAKE 1 TABLET BY MOUTH EVERY DAY 12/05/13   Max T Hyatt, DPM  methylPREDNIsolone (MEDROL DOSPACK) 4 MG tablet 6 day tapering dose-follow package instructions 08/17/13   Max T Hyatt, DPM  metroNIDAZOLE  (FLAGYL) 500 MG tablet Take 1 tablet (500 mg total) by mouth 3 (three) times daily. 08/29/12   Alphonsa Overall, MD  metroNIDAZOLE (FLAGYL) 500 MG tablet Take 1 tablet (500 mg total) by mouth 3 (three) times daily. 05/26/15   Shakiyah Cirilo, PA-C  ondansetron (ZOFRAN) 4 MG tablet Take 1 tablet (4 mg total) by mouth every 6 (six) hours. 05/26/15   Jaylea Plourde, PA-C  oxyCODONE-acetaminophen (PERCOCET/ROXICET) 5-325 MG tablet Take 2 tablets by mouth every 4 (four) hours as needed for severe pain. 05/26/15   Deavin Forst, PA-C   BP 121/71 mmHg  Pulse 72  Temp(Src) 100 F (37.8 C) (Oral)  Resp 20  SpO2 94% Physical Exam  Constitutional: She appears well-developed and well-nourished. No distress.  Nontoxic appearing  HENT:  Head: Normocephalic and atraumatic.  Eyes: Conjunctivae are normal. Right eye exhibits no discharge. Left eye exhibits no discharge. No scleral icterus.  Neck: Normal range of motion.  Cardiovascular: Normal rate, regular rhythm and normal heart sounds.   Pulmonary/Chest: Effort normal and breath sounds normal. No respiratory distress.  Abdominal: Normal appearance and bowel sounds are normal. There is tenderness in the left lower quadrant. There is no rigidity, no rebound and no guarding.  Musculoskeletal: Normal range of motion.  Neurological: She is alert. Coordination normal.  Skin: Skin is warm and dry.  Psychiatric: She has a normal mood and affect. Her behavior is normal.  Nursing note and vitals reviewed.   ED Course  Procedures (including critical care time) Labs Review Labs Reviewed  COMPREHENSIVE METABOLIC PANEL - Abnormal; Notable for the following:    Glucose, Bld 108 (*)    Creatinine, Ser 1.06 (*)    Total Protein 8.9 (*)    GFR calc non Af Amer 58 (*)    All other components within normal limits  CBC - Abnormal; Notable for the following:    WBC 11.6 (*)    All other components within normal limits  URINALYSIS, ROUTINE W REFLEX MICROSCOPIC (NOT AT [facility]) -  Abnormal; Notable for the following:    APPearance HAZY (*)    All other components within normal limits  LIPASE, BLOOD  CBC WITH DIFFERENTIAL/PLATELET    Imaging Review Ct Abdomen Pelvis Wo Contrast  05/26/2015  CLINICAL DATA:  Left lower quadrant pain EXAM: CT ABDOMEN AND PELVIS WITHOUT CONTRAST TECHNIQUE: Multidetector CT imaging of the abdomen and pelvis was performed following the standard protocol without IV contrast. COMPARISON:  08/25/2012 FINDINGS: Lung bases are free of acute infiltrate or sizable effusion. The gallbladder has been surgically removed. The liver is diffusely fatty infiltrated. The spleen, adrenal glands and pancreas are stable in appearance. A hypodense lesion within the spleen is again noted likely representing a splenic hemangioma. The kidneys are well visualized without renal calculi or urinary tract obstructive changes. The appendix is within normal limits. There are changes of diverticular disease within the left colon with evidence of pericolonic inflammatory change consistent with acute diverticulitis. No perforation  or pericolonic abscess is identified. The bladder is well distended. No pelvic mass lesion is seen. The osseous structures show diffuse degenerative changes of lumbar spine. IMPRESSION: Changes consistent with acute diverticulitis without perforation or abscess formation. Chronic changes similar to that seen on prior exam. Electronically Signed   By: Inez Catalina M.D.   On: 05/26/2015 21:51   I have personally reviewed and evaluated these images and lab results as part of my medical decision-making.   EKG Interpretation None      MDM   Final diagnoses:  Diverticulitis of intestine without perforation or abscess without bleeding   55 year old female with a past medical history of diverticulitis presenting with left lower quadrant pain. VSS. Patient is nontoxic, nonseptic appearing, in no apparent distress. Abdomen is soft with tenderness in left  lower quadrant. No peritoneal signs suggesting surgical abdomen. Patient's pain and other symptoms adequately managed in emergency department. Fluid bolus given. Labs, imaging and vitals reviewed. Leukocytosis to 11.6. Creatinine slightly elevated at 1.06. CT abdomen has changes consistent with acute diverticulitis without perforation or abscess. Patient does not meet the SIRS or Sepsis criteria and is appropriate for outpatient therapy. Patient discharged home with Flagyl, Cipro and symptomatic treatment and given strict instructions for follow-up with their gastroenterologist.  I have also discussed reasons to return immediately to the ER.  Patient expresses understanding and agrees with plan.      Lahoma Crocker Christain Mcraney, PA-C 05/26/15 2339  Courteney Lyn Mackuen, MD 05/29/15 [id number]

## 2015-05-26 NOTE — Discharge Instructions (Signed)
Diverticulitis °Diverticulitis is inflammation or infection of small pouches in your colon that form when you have a condition called diverticulosis. The pouches in your colon are called diverticula. Your colon, or large intestine, is where water is absorbed and stool is formed. °Complications of diverticulitis can include: °· Bleeding. °· Severe infection. °· Severe pain. °· Perforation of your colon. °· Obstruction of your colon. °CAUSES  °Diverticulitis is caused by bacteria. °Diverticulitis happens when stool becomes trapped in diverticula. This allows bacteria to grow in the diverticula, which can lead to inflammation and infection. °RISK FACTORS °People with diverticulosis are at risk for diverticulitis. Eating a diet that does not include enough fiber from fruits and vegetables may make diverticulitis more likely to develop. °SYMPTOMS  °Symptoms of diverticulitis may include: °· Abdominal pain and tenderness. The pain is normally located on the left side of the abdomen, but may occur in other areas. °· Fever and chills. °· Bloating. °· Cramping. °· Nausea. °· Vomiting. °· Constipation. °· Diarrhea. °· Blood in your stool. °DIAGNOSIS  °Your health care provider will ask you about your medical history and do a physical exam. You may need to have tests done because many medical conditions can cause the same symptoms as diverticulitis. Tests may include: °· Blood tests. °· Urine tests. °· Imaging tests of the abdomen, including X-rays and CT scans. °When your condition is under control, your health care provider may recommend that you have a colonoscopy. A colonoscopy can show how severe your diverticula are and whether something else is causing your symptoms. °TREATMENT  °Most cases of diverticulitis are mild and can be treated at home. Treatment may include: °· Taking over-the-counter pain medicines. °· Following a clear liquid diet. °· Taking antibiotic medicines by mouth for 7-10 days. °More severe cases may  be treated at a hospital. Treatment may include: °· Not eating or drinking. °· Taking prescription pain medicine. °· Receiving antibiotic medicines through an IV tube. °· Receiving fluids and nutrition through an IV tube. °· Surgery. °HOME CARE INSTRUCTIONS  °· Follow your health care provider's instructions carefully. °· Follow a full liquid diet or other diet as directed by your health care provider. After your symptoms improve, your health care provider may tell you to change your diet. He or she may recommend you eat a high-fiber diet. Fruits and vegetables are good sources of fiber. Fiber makes it easier to pass stool. °· Take fiber supplements or probiotics as directed by your health care provider. °· Only take medicines as directed by your health care provider. °· Keep all your follow-up appointments. °SEEK MEDICAL CARE IF:  °· Your pain does not improve. °· You have a hard time eating food. °· Your bowel movements do not return to normal. °SEEK IMMEDIATE MEDICAL CARE IF:  °· Your pain becomes worse. °· Your symptoms do not get better. °· Your symptoms suddenly get worse. °· You have a fever. °· You have repeated vomiting. °· You have bloody or black, tarry stools. °MAKE SURE YOU:  °· Understand these instructions. °· Will watch your condition. °· Will get help right away if you are not doing well or get worse. °  °This information is not intended to replace advice given to you by your health care provider. Make sure you discuss any questions you have with your health care provider. °  °Document Released: 11/19/2004 Document Revised: 02/14/2013 Document Reviewed: 01/04/2013 °Elsevier Interactive Patient Education ©2016 [employer] ° °

## 2015-05-27 MED ORDER — OXYCODONE-ACETAMINOPHEN 5-325 MG PO TABS
1.0000 | ORAL_TABLET | Freq: Once | ORAL | Status: AC
  Administered 2015-05-27: 1 via ORAL
  Filled 2015-05-27: qty 1

## 2015-05-27 MED ORDER — ONDANSETRON HCL 4 MG/2ML IJ SOLN
4.0000 mg | Freq: Once | INTRAMUSCULAR | Status: AC
  Administered 2015-05-27: 4 mg via INTRAVENOUS
  Filled 2015-05-27: qty 2

## 2015-06-14 ENCOUNTER — Emergency Department (HOSPITAL_COMMUNITY)
Admission: EM | Admit: 2015-06-14 | Discharge: 2015-06-14 | Disposition: A | Discharge status: Home / Self Care | Payer: BC Managed Care – PPO | Attending: Emergency Medicine | Admitting: Emergency Medicine

## 2015-06-14 ENCOUNTER — Encounter (HOSPITAL_COMMUNITY): Payer: Self-pay | Admitting: *Deleted

## 2015-06-14 ENCOUNTER — Emergency Department (HOSPITAL_COMMUNITY): Payer: BC Managed Care – PPO

## 2015-06-14 DIAGNOSIS — K219 Gastro-esophageal reflux disease without esophagitis: Secondary | ICD-10-CM | POA: Diagnosis not present

## 2015-06-14 DIAGNOSIS — K5792 Diverticulitis of intestine, part unspecified, without perforation or abscess without bleeding: Secondary | ICD-10-CM

## 2015-06-14 DIAGNOSIS — Z87442 Personal history of urinary calculi: Secondary | ICD-10-CM | POA: Diagnosis not present

## 2015-06-14 DIAGNOSIS — E669 Obesity, unspecified: Secondary | ICD-10-CM | POA: Insufficient documentation

## 2015-06-14 DIAGNOSIS — I10 Essential (primary) hypertension: Secondary | ICD-10-CM | POA: Insufficient documentation

## 2015-06-14 DIAGNOSIS — K5732 Diverticulitis of large intestine without perforation or abscess without bleeding: Secondary | ICD-10-CM | POA: Diagnosis not present

## 2015-06-14 DIAGNOSIS — Z79899 Other long term (current) drug therapy: Secondary | ICD-10-CM | POA: Insufficient documentation

## 2015-06-14 DIAGNOSIS — F419 Anxiety disorder, unspecified: Secondary | ICD-10-CM | POA: Insufficient documentation

## 2015-06-14 DIAGNOSIS — R109 Unspecified abdominal pain: Secondary | ICD-10-CM | POA: Diagnosis present

## 2015-06-14 HISTORY — DX: Disorder of kidney and ureter, unspecified: N28.9

## 2015-06-14 HISTORY — DX: Diverticulitis of intestine, part unspecified, without perforation or abscess without bleeding: K57.92

## 2015-06-14 LAB — COMPREHENSIVE METABOLIC PANEL
ALBUMIN: 3.7 g/dL (ref 3.5–5.0)
ALT: 22 U/L (ref 14–54)
ANION GAP: 12 (ref 5–15)
AST: 22 U/L (ref 15–41)
Alkaline Phosphatase: 69 U/L (ref 38–126)
BUN: 16 mg/dL (ref 6–20)
CHLORIDE: 100 mmol/L — AB (ref 101–111)
CO2: 24 mmol/L (ref 22–32)
Calcium: 9.5 mg/dL (ref 8.9–10.3)
Creatinine, Ser: 1.14 mg/dL — ABNORMAL HIGH (ref 0.44–1.00)
GFR calc Af Amer: >60 mL/min (ref >60)
GFR, EST NON AFRICAN AMERICAN: 53 mL/min — AB (ref >60)
Glucose, Bld: 111 mg/dL — ABNORMAL HIGH (ref 65–99)
POTASSIUM: 3.6 mmol/L (ref 3.5–5.1)
Sodium: 136 mmol/L (ref 135–145)
TOTAL PROTEIN: 8 g/dL (ref 6.5–8.1)
Total Bilirubin: 1 mg/dL (ref 0.3–1.2)

## 2015-06-14 LAB — URINALYSIS, ROUTINE W REFLEX MICROSCOPIC
Bilirubin Urine: NEGATIVE
GLUCOSE, UA: NEGATIVE mg/dL
Hgb urine dipstick: NEGATIVE
Ketones, ur: NEGATIVE mg/dL
NITRITE: NEGATIVE
PH: 7 (ref 5.0–8.0)
Protein, ur: NEGATIVE mg/dL
SPECIFIC GRAVITY, URINE: 1.014 (ref 1.005–1.030)

## 2015-06-14 LAB — CBC
HEMATOCRIT: 41.3 % (ref 36.0–46.0)
HEMOGLOBIN: 13.6 g/dL (ref 12.0–15.0)
MCH: 29.2 pg (ref 26.0–34.0)
MCHC: 32.9 g/dL (ref 30.0–36.0)
MCV: 88.8 fL (ref 78.0–100.0)
Platelets: 269 10*3/uL (ref 150–400)
RBC: 4.65 MIL/uL (ref 3.87–5.11)
RDW: 14.1 % (ref 11.5–15.5)
WBC: 11.9 10*3/uL — AB (ref 4.0–10.5)

## 2015-06-14 LAB — URINE MICROSCOPIC-ADD ON

## 2015-06-14 LAB — LIPASE, BLOOD: LIPASE: 27 U/L (ref 11–51)

## 2015-06-14 MED ORDER — METRONIDAZOLE 500 MG PO TABS: 500.0000 mg | ORAL_TABLET | Freq: Three times a day (TID) | ORAL | Status: DC

## 2015-06-14 MED ORDER — IOPAMIDOL (ISOVUE-300) INJECTION 61%
INTRAVENOUS | Status: AC
  Administered 2015-06-14: 100 mL via INTRAVENOUS
  Filled 2015-06-14: qty 100

## 2015-06-14 MED ORDER — CIPROFLOXACIN HCL 500 MG PO TABS: 500.0000 mg | ORAL_TABLET | Freq: Two times a day (BID) | ORAL | Status: DC

## 2015-06-14 MED ORDER — ONDANSETRON HCL 4 MG/2ML IJ SOLN
4.0000 mg | Freq: Once | INTRAMUSCULAR | Status: AC | PRN
  Administered 2015-06-14: 4 mg via INTRAVENOUS
  Filled 2015-06-14: qty 2

## 2015-06-14 MED ORDER — SODIUM CHLORIDE 0.9 % IV BOLUS (SEPSIS)
1000.0000 mL | Freq: Once | INTRAVENOUS | Status: AC
  Administered 2015-06-14: 1000 mL via INTRAVENOUS

## 2015-06-14 NOTE — ED Notes (Signed)
Patient c/o abd. Pain onset last Mon. States she was seen 2 weeks ago at [facility] for same and was given Flagyl and Cipro, Last dose was last Monday. staets Wed. Started with nausea no vomiting. Last bowel movement Monday. States she has pressure like she needs to have bm however is afraid to strain.

## 2015-06-14 NOTE — ED Notes (Signed)
Pt feels she has diverticulitis. States tx for same at [facility] recently.  Finished antibiotics last Tues.  Now c/o abdominal pain and nausea since Wed.  States fever yesterday.

## 2015-06-14 NOTE — ED Provider Notes (Signed)
CSN: [id number]     Arrival date & time 06/14/15  0946 History   First MD Initiated Contact with Patient 06/14/15 1007     Chief Complaint  Patient presents with  . Abdominal Pain     (Consider location/radiation/quality/duration/timing/severity/associated sxs/prior Treatment) HPI 55 year old female presenting with abdominal pain. She recently was diagnosed with diverticulitis, approximately 2 and half weeks ago. She was seen at [facility], had a CT scan that was consistent with acute diverticulitis, and she was treated with 2 weeks of Cipro and Flagyl. She finished these at the beginning of the week, and 2 days ago began having recurrent left lower quadrant pain. She said she had subjective fevers beginning yesterday, and took her temperature and it was 100.3. She has had no bloody bowel movements, nausea but no vomiting. She says the pain is sharp, stabbing, worse with movement, better when standing still. She says it feels just like it did previously when she had diverticulitis.   Past Medical History  Diagnosis Date  . Obesity   . Anxiety disorder   . Gallstones   . Hyperlipidemia   . Hypertension   . GERD (gastroesophageal reflux disease)   . Diverticulitis   . Renal disorder     kidney stones   Past Surgical History  Procedure Laterality Date  . Cholecystectomy    . Tonsillectomy      as child-tonsils and adenoids  . Uterine ablation  2003  . Total knee arthroplasty  11/23/2011    Procedure: TOTAL KNEE ARTHROPLASTY;  Surgeon: Mauri Pole, MD;  Location: [facility] ORS;  Service: Orthopedics;  Laterality: Right;  . Cesarean section     Family History  Problem Relation Age of Onset  . Colon cancer Neg Hx   . Hypertension Mother   . Bone cancer Father   . Bladder Cancer Father    Social History  Substance Use Topics  . Smoking status: Never Smoker   . Smokeless tobacco: Never Used  . Alcohol Use: Yes     Comment: occassionally   OB History    No data available      Review of Systems  Constitutional: Positive for fever and chills.  Respiratory: Negative for cough and shortness of breath.   Cardiovascular: Negative for chest pain and leg swelling.  Gastrointestinal: Positive for nausea and abdominal pain. Negative for vomiting, diarrhea, constipation, blood in stool and abdominal distention.  All other systems reviewed and are negative.     Allergies  Dilaudid and Quinapril hcl  Home Medications   Prior to Admission medications   Medication Sig Start Date End Date Taking? Authorizing Provider  Cholecalciferol (VITAMIN D3) 1000 units CAPS Take 1 capsule by mouth daily.   Yes Historical Provider, MD  fluticasone (FLONASE) 50 MCG/ACT nasal spray Place 1 spray into both nostrils daily as needed for allergies.  07/10/13  Yes Historical Provider, MD  meloxicam (MOBIC) 15 MG tablet TAKE 1 TABLET BY MOUTH EVERY DAY Patient taking differently: TAKE 1 TABLET BY MOUTH EVERY DAY AS NEEDED FOR OSTEOARTHRITIS 12/05/13  Yes Max T Hyatt, DPM  ondansetron (ZOFRAN) 4 MG tablet Take 1 tablet (4 mg total) by mouth every 6 (six) hours. 05/26/15  Yes Stevi Barrett, PA-C  pantoprazole (PROTONIX) 40 MG tablet Take 40 mg by mouth daily. Reported on 06/14/2015   Yes Historical Provider, MD  rOPINIRole (REQUIP) 0.25 MG tablet Take 1 tablet by mouth daily. 05/05/15  Yes Historical Provider, MD  sertraline (ZOLOFT) 100 MG tablet Take 100  mg by mouth daily.    Yes Historical Provider, MD  triamterene-hydrochlorothiazide (MAXZIDE-25) 37.5-25 MG per tablet Take 1 tablet by mouth daily.   Yes Historical Provider, MD  ciprofloxacin (CIPRO) 500 MG tablet Take 1 tablet (500 mg total) by mouth 2 (two) times daily. One po bid x 14 days 06/14/15   Leata Mouse, MD  methylPREDNIsolone (MEDROL DOSPACK) 4 MG tablet 6 day tapering dose-follow package instructions Patient not taking: Reported on 06/14/2015 08/17/13   Max T Hyatt, DPM  metroNIDAZOLE (FLAGYL) 500 MG tablet Take 1 tablet (500 mg  total) by mouth 3 (three) times daily. One po bid x 14 days 06/14/15   Leata Mouse, MD  oxyCODONE-acetaminophen (PERCOCET/ROXICET) 5-325 MG tablet Take 2 tablets by mouth every 4 (four) hours as needed for severe pain. Patient not taking: Reported on 06/14/2015 05/26/15   Stevi Barrett, PA-C   BP 126/81 mmHg  Pulse 68  Temp(Src) 98.9 F (37.2 C) (Oral)  Resp 18  Ht 5\' 5"  (1.651 m)  Wt 115.939 kg  BMI 42.53 kg/m2  SpO2 98% Physical Exam  Constitutional: She is oriented to person, place, and time. She appears well-developed and well-nourished. No distress.  HENT:  Head: Normocephalic.  Eyes: Pupils are equal, round, and reactive to light.  Neck: Normal range of motion. No JVD present.  Cardiovascular: Normal rate and regular rhythm.   Pulmonary/Chest: Effort normal and breath sounds normal. No respiratory distress.  Abdominal: Soft. Bowel sounds are normal. She exhibits no distension. There is tenderness (left lower quadrant, no right lower quadrant tenderness). There is guarding.  Musculoskeletal: Normal range of motion. She exhibits no edema.  Neurological: She is alert and oriented to person, place, and time.  Skin: Skin is warm and dry.  Psychiatric: She has a normal mood and affect.  Nursing note and vitals reviewed.   ED Course  Procedures (including critical care time) Labs Review Labs Reviewed  COMPREHENSIVE METABOLIC PANEL - Abnormal; Notable for the following:    Chloride 100 (*)    Glucose, Bld 111 (*)    Creatinine, Ser 1.14 (*)    GFR calc non Af Amer 53 (*)    All other components within normal limits  CBC - Abnormal; Notable for the following:    WBC 11.9 (*)    All other components within normal limits  URINALYSIS, ROUTINE W REFLEX MICROSCOPIC (NOT AT [facility]) - Abnormal; Notable for the following:    APPearance CLOUDY (*)    Leukocytes, UA SMALL (*)    All other components within normal limits  URINE MICROSCOPIC-ADD ON - Abnormal; Notable for the following:     Squamous Epithelial / LPF 6-30 (*)    Bacteria, UA FEW (*)    All other components within normal limits  LIPASE, BLOOD    Imaging Review Ct Abdomen Pelvis W Contrast  06/14/2015  CLINICAL DATA:  Lower abdominal pain and fever for 1 day EXAM: CT ABDOMEN AND PELVIS WITH CONTRAST TECHNIQUE: Multidetector CT imaging of the abdomen and pelvis was performed using the standard protocol following bolus administration of intravenous contrast. CONTRAST:  183mL ISOVUE-300 IOPAMIDOL (ISOVUE-300) INJECTION 61% COMPARISON:  May 26, 2015 FINDINGS: Lower chest: There is slight atelectasis in the posterior right lung base. Lung bases otherwise are clear. There is a small hiatal hernia. Hepatobiliary: There is hepatic steatosis. Liver is prominent, measuring 21.1 cm in length. No focal liver lesions are identified. Gallbladder is absent. There is no biliary duct dilatation. Pancreas: There is no pancreatic mass  or inflammatory focus. Spleen: There is a cyst in posterolateral aspect of the spleen measuring 1.9 x 1.8 cm. No other splenic lesions are evident. Adrenals/Urinary Tract: Adrenals appear normal. There is a cyst arising from the upper pole of the left kidney medially measuring 1.6 x 1.4 cm. There is no appreciable hydronephrosis on either side. There is an extrarenal pelvis on the right, an anatomic variant. There is no renal or ureteral calculus on either side. Urinary bladder is midline with wall thickness within normal limits. Stomach/Bowel: There is extensive sigmoid diverticulosis. There is persistent diverticulitis involving the proximal sigmoid colon extending to the junction of the descending colon and sigmoid colon with mesenteric in wall thickening but no discrete abscess or perforation. There is slightly less wall thickening and inflammatory change in this area compared to 3 weeks prior. Elsewhere there is no bowel wall thickening. No bowel obstruction. No free air or portal venous air. Vascular/Lymphatic:  There is mild atherosclerotic calcification in the aorta. No aneurysm apparent. Major mesenteric vessels appear patent. There is no demonstrable adenopathy in the abdomen or pelvis. Reproductive: Uterus is anteverted. No uterine or ovarian masses are evident. Other: Appendix appears normal. No abscess or ascites in the abdomen or pelvis. Small ventral hernia containing only fat present. Musculoskeletal: There is extensive degenerative type change at L5-S1 with stable anterolisthesis of L5 on S1. There are no blastic or lytic bone lesions. No intramuscular or abdominal wall lesion. IMPRESSION: Diverticulitis is again noted in proximal sigmoid colon. Compared to 3 weeks prior, there is slightly less wall thickening and mesenteric inflammation. No abscess or perforation seen. There is extensive diverticulosis elsewhere throughout the sigmoid colon. No bowel obstruction. Appendix appears normal. No abscess in the abdomen or pelvis. No renal or ureteral calculus.  No hydronephrosis. Prominent liver with hepatic steatosis. Gallbladder absent. No biliary duct dilatation. Small hiatal hernia. There is a small ventral hernia containing only fat. Electronically Signed   By: Lowella Grip III M.D.   On: 06/14/2015 12:41   I have personally reviewed and evaluated these images and lab results as part of my medical decision-making.   EKG Interpretation None      MDM   Final diagnoses:  Diverticulitis of intestine without perforation or abscess without bleeding    55 year old female presents with diverticulitis. She recently was diagnosed with diverticulitis and was feeling better after completing her course of antibiotics, however presents again with signs and symptoms consistent with recurrent diverticulitis. Due to concern for perforation, abscess, or recurrent disease, CT scan was obtained. It shows slightly improved diverticulitis, but no evidence of perforation or abscess. The patient appears well, is  tolerating medicine by mouth, has normal vital signs, will discharge on oral antibiotics, and have her follow-up with her surgeon, she may warrant surgical intervention at this point.     Leata Mouse, MD 06/15/15 [place], MD 06/16/15 [phone]

## 2015-06-14 NOTE — Discharge Instructions (Signed)
Diverticulitis  Diverticulitis is when small pockets that have formed in your colon (large intestine) become infected or swollen.  HOME CARE  · Follow your doctor's instructions.  · Follow a special diet if told by your doctor.  · When you feel better, your doctor may tell you to change your diet. You may be told to eat a lot of fiber. Fruits and vegetables are good sources of fiber. Fiber makes it easier to poop (have bowel movements).  · Take supplements or probiotics as told by your doctor.  · Only take medicines as told by your doctor.  · Keep all follow-up visits with your doctor.  GET HELP IF:  · Your pain does not get better.  · You have a hard time eating food.  · You are not pooping like normal.  GET HELP RIGHT AWAY IF:  · Your pain gets worse.  · Your problems do not get better.  · Your problems suddenly get worse.  · You have a fever.  · You keep throwing up (vomiting).  · You have bloody or black, tarry poop (stool).  MAKE SURE YOU:   · Understand these instructions.  · Will watch your condition.  · Will get help right away if you are not doing well or get worse.     This information is not intended to replace advice given to you by your health care provider. Make sure you discuss any questions you have with your health care provider.     Document Released: 07/29/2007 Document Revised: 02/14/2013 Document Reviewed: 01/04/2013  Elsevier Interactive Patient Education ©2016 [employer]

## 2016-06-07 ENCOUNTER — Inpatient Hospital Stay (HOSPITAL_COMMUNITY)
Admission: EM | Admit: 2016-06-07 | Discharge: 2016-06-17 | DRG: 330 | Disposition: A | Discharge status: Home Health | Payer: BC Managed Care – PPO | Attending: General Surgery | Admitting: General Surgery

## 2016-06-07 ENCOUNTER — Emergency Department (HOSPITAL_COMMUNITY): Payer: BC Managed Care – PPO

## 2016-06-07 DIAGNOSIS — M1990 Unspecified osteoarthritis, unspecified site: Secondary | ICD-10-CM | POA: Diagnosis present

## 2016-06-07 DIAGNOSIS — K219 Gastro-esophageal reflux disease without esophagitis: Secondary | ICD-10-CM | POA: Diagnosis present

## 2016-06-07 DIAGNOSIS — F419 Anxiety disorder, unspecified: Secondary | ICD-10-CM | POA: Diagnosis present

## 2016-06-07 DIAGNOSIS — Z888 Allergy status to other drugs, medicaments and biological substances status: Secondary | ICD-10-CM

## 2016-06-07 DIAGNOSIS — K5792 Diverticulitis of intestine, part unspecified, without perforation or abscess without bleeding: Secondary | ICD-10-CM | POA: Diagnosis present

## 2016-06-07 DIAGNOSIS — Z79899 Other long term (current) drug therapy: Secondary | ICD-10-CM | POA: Diagnosis not present

## 2016-06-07 DIAGNOSIS — I129 Hypertensive chronic kidney disease with stage 1 through stage 4 chronic kidney disease, or unspecified chronic kidney disease: Secondary | ICD-10-CM | POA: Diagnosis present

## 2016-06-07 DIAGNOSIS — Z8249 Family history of ischemic heart disease and other diseases of the circulatory system: Secondary | ICD-10-CM | POA: Diagnosis not present

## 2016-06-07 DIAGNOSIS — G8918 Other acute postprocedural pain: Secondary | ICD-10-CM | POA: Diagnosis not present

## 2016-06-07 DIAGNOSIS — N179 Acute kidney failure, unspecified: Secondary | ICD-10-CM | POA: Diagnosis present

## 2016-06-07 DIAGNOSIS — Z87442 Personal history of urinary calculi: Secondary | ICD-10-CM | POA: Diagnosis not present

## 2016-06-07 DIAGNOSIS — E876 Hypokalemia: Secondary | ICD-10-CM | POA: Diagnosis present

## 2016-06-07 DIAGNOSIS — Z6841 Body Mass Index (BMI) 40.0 and over, adult: Secondary | ICD-10-CM

## 2016-06-07 DIAGNOSIS — Z7951 Long term (current) use of inhaled steroids: Secondary | ICD-10-CM | POA: Diagnosis not present

## 2016-06-07 DIAGNOSIS — Z885 Allergy status to narcotic agent status: Secondary | ICD-10-CM

## 2016-06-07 DIAGNOSIS — E785 Hyperlipidemia, unspecified: Secondary | ICD-10-CM | POA: Diagnosis present

## 2016-06-07 DIAGNOSIS — F329 Major depressive disorder, single episode, unspecified: Secondary | ICD-10-CM | POA: Diagnosis present

## 2016-06-07 DIAGNOSIS — K5720 Diverticulitis of large intestine with perforation and abscess without bleeding: Principal | ICD-10-CM | POA: Diagnosis present

## 2016-06-07 DIAGNOSIS — J302 Other seasonal allergic rhinitis: Secondary | ICD-10-CM | POA: Diagnosis present

## 2016-06-07 DIAGNOSIS — K5732 Diverticulitis of large intestine without perforation or abscess without bleeding: Secondary | ICD-10-CM

## 2016-06-07 DIAGNOSIS — N181 Chronic kidney disease, stage 1: Secondary | ICD-10-CM | POA: Diagnosis present

## 2016-06-07 DIAGNOSIS — E860 Dehydration: Secondary | ICD-10-CM | POA: Diagnosis present

## 2016-06-07 DIAGNOSIS — Z91030 Bee allergy status: Secondary | ICD-10-CM | POA: Diagnosis not present

## 2016-06-07 DIAGNOSIS — Z9049 Acquired absence of other specified parts of digestive tract: Secondary | ICD-10-CM | POA: Diagnosis not present

## 2016-06-07 DIAGNOSIS — R1032 Left lower quadrant pain: Secondary | ICD-10-CM | POA: Diagnosis present

## 2016-06-07 LAB — COMPREHENSIVE METABOLIC PANEL
ALT: 32 U/L (ref 14–54)
AST: 34 U/L (ref 15–41)
Albumin: 4.2 g/dL (ref 3.5–5.0)
Alkaline Phosphatase: 76 U/L (ref 38–126)
Anion gap: 13 (ref 5–15)
BUN: 23 mg/dL — AB (ref 6–20)
CHLORIDE: 101 mmol/L (ref 101–111)
CO2: 25 mmol/L (ref 22–32)
Calcium: 9.6 mg/dL (ref 8.9–10.3)
Creatinine, Ser: 1.35 mg/dL — ABNORMAL HIGH (ref 0.44–1.00)
GFR calc Af Amer: 50 mL/min — ABNORMAL LOW (ref >60)
GFR calc non Af Amer: 43 mL/min — ABNORMAL LOW (ref >60)
GLUCOSE: 103 mg/dL — AB (ref 65–99)
POTASSIUM: 3.3 mmol/L — AB (ref 3.5–5.1)
Sodium: 139 mmol/L (ref 135–145)
Total Bilirubin: 0.6 mg/dL (ref 0.3–1.2)
Total Protein: 7.7 g/dL (ref 6.5–8.1)

## 2016-06-07 LAB — CBC WITH DIFFERENTIAL/PLATELET
BASOS ABS: 0 10*3/uL (ref 0.0–0.1)
BASOS PCT: 0 %
EOS PCT: 0 %
Eosinophils Absolute: 0 10*3/uL (ref 0.0–0.7)
HCT: 43.3 % (ref 36.0–46.0)
Hemoglobin: 14.5 g/dL (ref 12.0–15.0)
Lymphocytes Relative: 6 %
Lymphs Abs: 0.8 10*3/uL (ref 0.7–4.0)
MCH: 29.9 pg (ref 26.0–34.0)
MCHC: 33.5 g/dL (ref 30.0–36.0)
MCV: 89.3 fL (ref 78.0–100.0)
MONO ABS: 0.8 10*3/uL (ref 0.1–1.0)
Monocytes Relative: 6 %
Neutro Abs: 11.2 10*3/uL — ABNORMAL HIGH (ref 1.7–7.7)
Neutrophils Relative %: 88 %
PLATELETS: 213 10*3/uL (ref 150–400)
RBC: 4.85 MIL/uL (ref 3.87–5.11)
RDW: 13.9 % (ref 11.5–15.5)
WBC: 12.8 10*3/uL — ABNORMAL HIGH (ref 4.0–10.5)

## 2016-06-07 LAB — LIPASE, BLOOD: Lipase: 20 U/L (ref 11–51)

## 2016-06-07 MED ORDER — SODIUM CHLORIDE 0.9 % IV BOLUS (SEPSIS)
500.0000 mL | Freq: Once | INTRAVENOUS | Status: AC
  Administered 2016-06-07: 500 mL via INTRAVENOUS

## 2016-06-07 MED ORDER — ONDANSETRON HCL 4 MG/2ML IJ SOLN
4.0000 mg | Freq: Once | INTRAMUSCULAR | Status: AC
  Administered 2016-06-07: 4 mg via INTRAVENOUS
  Filled 2016-06-07: qty 2

## 2016-06-07 MED ORDER — PIPERACILLIN-TAZOBACTAM 3.375 G IVPB 30 MIN
3.3750 g | Freq: Once | INTRAVENOUS | Status: AC
  Administered 2016-06-07: 3.375 g via INTRAVENOUS
  Filled 2016-06-07: qty 50

## 2016-06-07 MED ORDER — MORPHINE SULFATE (PF) 4 MG/ML IV SOLN
4.0000 mg | INTRAVENOUS | Status: DC | PRN
  Administered 2016-06-07 (×2): 4 mg via INTRAVENOUS
  Filled 2016-06-07 (×2): qty 1

## 2016-06-07 MED ORDER — SODIUM CHLORIDE 0.9 % IV SOLN
Freq: Once | INTRAVENOUS | Status: AC
  Administered 2016-06-07: 19:00:00 via INTRAVENOUS

## 2016-06-07 NOTE — ED Provider Notes (Signed)
[facility] DEPT Provider Note   CSN: [id number] Arrival date & time: 06/07/16  1724     History   Chief Complaint Chief Complaint  Patient presents with  . Abdominal Pain  . Nausea    HPI [name] [name] is a 56 y.o. female.Chief complaint is nausea and vomiting, with sudden left lower quadrant abdominal pain.  HPI: Patient reports being in her normal state of health until several hours before her arrival. She developed sudden onset of nausea and vomiting. She had a normal bowel movement. She had the feeling of an urge to defecate after this. States she was unable to. She develop worsening left lower quadrant abdominal pain. She presents here continuing to complain of pain and nausea.  No recent illnesses. No fevers. No recent abdominal pain. Significant past medical history for diverticulitis. This is been recurrent. She had an episode of perforation that required operative repair, without colostomy.  Past Medical History:  Diagnosis Date  . Anxiety disorder   . Diverticulitis   . Gallstones   . GERD (gastroesophageal reflux disease)   . Hyperlipidemia   . Hypertension   . Obesity   . Renal disorder    kidney stones    Patient Active Problem List   Diagnosis Date Noted  . Diverticuitis sigmoid colon with perforation and abscess 08/26/2012  . Expected blood loss anemia 11/24/2011  . Obese 11/24/2011  . Hypokalemia 11/24/2011  . 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 Gastritis 11/04/2011  . DYSPHAGIA 01/10/2008    Past Surgical History:  Procedure Laterality Date  . CESAREAN SECTION    . CHOLECYSTECTOMY    . TONSILLECTOMY     as child-tonsils and adenoids  . TOTAL KNEE ARTHROPLASTY  11/23/2011   Procedure: TOTAL KNEE ARTHROPLASTY;  Surgeon: Mauri Pole, MD;  Location: [facility] ORS;  Service: Orthopedics;  Laterality: Right;  . uterine ablation  2003    OB History    No data available       Home Medications    Prior to Admission medications   Medication Sig Start  Date End Date Taking? Authorizing Provider  amoxicillin (AMOXIL) 500 MG capsule Take 2,000 mg by mouth See admin instructions. Take 4 capsules (2000 mg) by mouth one hour prior to dental appointment   Yes Historical Provider, MD  aspirin-acetaminophen-caffeine (EXCEDRIN MIGRAINE) [id number] MG tablet Take 2 tablets by mouth every 6 (six) hours as needed (neck pain).   Yes Historical Provider, MD  Cholecalciferol (VITAMIN D3) 1000 units CAPS Take 1,000 Units by mouth daily.    Yes Historical Provider, MD  fluticasone (FLONASE) 50 MCG/ACT nasal spray Place 1 spray into both nostrils daily as needed for allergies.  07/10/13  Yes Historical Provider, MD  loratadine (CLARITIN) 10 MG tablet Take 10 mg by mouth daily as needed (seasonal allergies).   Yes Historical Provider, MD  meloxicam (MOBIC) 15 MG tablet TAKE 1 TABLET BY MOUTH EVERY DAY Patient taking differently: TAKE 1 TABLET BY MOUTH EVERY DAY AS NEEDED FOR OSTEOARTHRITIS 12/05/13  Yes Max T Hyatt, DPM  pantoprazole (PROTONIX) 40 MG tablet Take 40 mg by mouth daily. Reported on 06/14/2015   Yes Historical Provider, MD  rOPINIRole (REQUIP) 0.25 MG tablet Take 0.25 mg by mouth at bedtime.  05/05/15  Yes Historical Provider, MD  sertraline (ZOLOFT) 100 MG tablet Take 100 mg by mouth daily.    Yes Historical Provider, MD  triamterene-hydrochlorothiazide (MAXZIDE-25) 37.5-25 MG per tablet Take 1 tablet by mouth daily.   Yes Historical Provider,  MD  ondansetron (ZOFRAN) 4 MG tablet Take 1 tablet (4 mg total) by mouth every 6 (six) hours. Patient not taking: Reported on 06/07/2016 05/26/15   [name] Crocker Barrett, PA-C  oxyCODONE-acetaminophen (PERCOCET/ROXICET) 5-325 MG tablet Take 2 tablets by mouth every 4 (four) hours as needed for severe pain. Patient not taking: Reported on 06/14/2015 05/26/15   Josephina Gip, PA-C    Family History Family History  Problem Relation Age of Onset  . Colon cancer Neg Hx   . Hypertension Mother   . Bone cancer Father   . Bladder  Cancer Father     Social History Social History  Substance Use Topics  . Smoking status: Never Smoker  . Smokeless tobacco: Never Used  . Alcohol use Yes     Comment: occassionally     Allergies   Dilaudid [hydromorphone hcl]; Quinapril hcl; and Yellow jacket venom [bee venom]   Review of Systems Review of Systems  Constitutional: Negative for appetite change, chills, diaphoresis, fatigue and fever.  HENT: Negative for mouth sores, sore throat and trouble swallowing.   Eyes: Negative for visual disturbance.  Respiratory: Negative for cough, chest tightness, shortness of breath and wheezing.   Cardiovascular: Negative for chest pain.  Gastrointestinal: Positive for abdominal pain, nausea and vomiting. Negative for abdominal distention and diarrhea.  Endocrine: Negative for polydipsia, polyphagia and polyuria.  Genitourinary: Negative for dysuria, frequency and hematuria.  Musculoskeletal: Negative for gait problem.  Skin: Negative for color change, pallor and rash.  Neurological: Negative for dizziness, syncope, light-headedness and headaches.  Hematological: Does not bruise/bleed easily.  Psychiatric/Behavioral: Negative for behavioral problems and confusion.     Physical Exam Updated Vital Signs BP 128/84   Pulse (!) 102   SpO2 98%   Physical Exam  Constitutional: She is oriented to person, place, and time. She appears well-developed and well-nourished. No distress.  55y/ofebrile. She is standing at the bedside and leaning over the bed. Prefers to hold still has pain with movement. It is tenderness throughout the left lower abdomen. No frank guarding rigid abdomen.  HENT:  Head: Normocephalic.  Eyes: Conjunctivae are normal. Pupils are equal, round, and reactive to light. No scleral icterus.  Neck: Normal range of motion. Neck supple. No thyromegaly present.  Cardiovascular: Normal rate and regular rhythm.  Exam reveals no gallop and no friction rub.   No murmur  heard. Pulmonary/Chest: Effort normal and breath sounds normal. No respiratory distress. She has no wheezes. She has no rales.  Abdominal: Soft. Bowel sounds are normal. She exhibits no distension. There is tenderness. There is no rebound.  Musculoskeletal: Normal range of motion.  Neurological: She is alert and oriented to person, place, and time.  Skin: Skin is warm and dry. No rash noted.  Psychiatric: She has a normal mood and affect. Her behavior is normal.     ED Treatments / Results  Labs (all labs ordered are listed, but only abnormal results are displayed) Labs Reviewed  CBC WITH DIFFERENTIAL/PLATELET - Abnormal; Notable for the following:       Result Value   WBC 12.8 (*)    Neutro Abs 11.2 (*)    All other components within normal limits  COMPREHENSIVE METABOLIC PANEL - Abnormal; Notable for the following:    Potassium 3.3 (*)    Glucose, Bld 103 (*)    BUN 23 (*)    Creatinine, Ser 1.35 (*)    GFR calc non Af Amer 43 (*)    GFR calc Af Amer 50 (*)  All other components within normal limits  LIPASE, BLOOD  URINALYSIS, ROUTINE W REFLEX MICROSCOPIC    EKG  EKG Interpretation None       Radiology Ct Renal Stone Study  Result Date: 06/07/2016 CLINICAL DATA:  Left flank pain starting this morning EXAM: CT ABDOMEN AND PELVIS WITHOUT CONTRAST TECHNIQUE: Multidetector CT imaging of the abdomen and pelvis was performed following the standard protocol without IV contrast. COMPARISON:  06/14/2015 FINDINGS: Lower chest: Lung bases shows no acute findings Hepatobiliary: Unenhanced liver shows no biliary ductal dilatation. Status post cholecystectomy. Pancreas: Unenhanced pancreas without focal abnormality. Spleen: Unenhanced spleen with no focal abnormality. Adrenals/Urinary Tract: No adrenal gland mass. No nephrolithiasis. No hydronephrosis or hydroureter. No calcified ureteral calculi. No calcified calculi are noted within under distended urinary bladder. Stomach/Bowel: No  gastric outlet obstruction. No thickened or dilated small bowel loops. Moderate stool and some colonic gas noted within cecum. Normal retrocecal appendix noted in axial image 65. The terminal ileum is unremarkable. Some colonic gas noted within transverse colon. Multiple diverticula are noted descending colon. Multiple diverticula are noted sigmoid colon. Axial image 72 there is mild stranding of pericolonic fat and mild thickening of of colonic wall in proximal sigmoid colon. There is small amount of extraluminal air just anterior to proximal sigmoid colon measures 2 cm axial image 73. Findings are consistent with perforated acute diverticulitis. No pericolonic abscess is noted. Vascular/Lymphatic: No aortic aneurysm. No retroperitoneal or mesenteric adenopathy. Reproductive: The uterus is atrophic.  No adnexal mass. Other: Additional foci of free abdominal air are noted in right upper abdomen just anterior to the liver axial image 26. Tiny foci of free air noted in splenic hilum and anterior to the pancreas. Musculoskeletal: Sagittal images of the spine shows mild degenerative changes lower thoracic spine. Probable Schmorl's node deformity upper endplate of T70 vertebral body. Significant disc space flattening at L5-S1 level. Stable mild anterolisthesis L5 on S1. IMPRESSION: 1. There is mild stranding of pericolonic fat and mild thickening of colonic wall axial image 72 proximal left colon left lower quadrant. Small extraluminal air measures 2 cm. Findings consistent with perforated acute diverticulitis. No pericolonic abscess is noted. Additional foci of free air are noted in upper right and left abdomen. 2. Moderate stool and some gas noted in cecum. No pericecal inflammation. Normal appendix. 3. No small bowel obstruction. 4. Degenerative changes thoracolumbar spine. 5. Status post cholecystectomy. These results were called by telephone at the time of interpretation on 06/07/2016 at 9:05 pm to Dr. Tanna Furry ,  who verbally acknowledged these results. Electronically Signed   By: [name] Crocker M.D.   On: 06/07/2016 21:05    Procedures Procedures (including critical care time)  Medications Ordered in ED Medications  morphine 4 MG/ML injection 4 mg (4 mg Intravenous Given 06/07/16 1915)  ondansetron (ZOFRAN) injection 4 mg (4 mg Intravenous Given 06/07/16 1915)  sodium chloride 0.9 % bolus 500 mL (0 mLs Intravenous Stopped 06/07/16 2018)  0.9 %  sodium chloride infusion ( Intravenous New Bag/Given 06/07/16 1916)  piperacillin-tazobactam (ZOSYN) IVPB 3.375 g (3.375 g Intravenous New Bag/Given 06/07/16 2125)     Initial Impression / Assessment and Plan / ED Course  I have reviewed the triage vital signs and the nursing notes.  Pertinent labs & imaging results that were available during my care of the patient were reviewed by me and considered in my medical decision making (see chart for details).     Patient made nothing by mouth. IV placed. Given pain  medication. Labs obtain  CT performed.  Has leukocytosis. Is not acidotic. CT shows perforated diverticulitis with free air. Case discussed with Dr. Barry Dienes of general surgery. Patient given IV Zofran and morphine for pain. Given IV Zosyn. Awaiting evaluation by surgery.  Final Clinical Impressions(s) / ED Diagnoses   Final diagnoses:  Diverticulitis large intestine w/o perforation or abscess w/o bleeding    New Prescriptions New Prescriptions   No medications on file     Tanna Furry, MD 06/07/16 2233

## 2016-06-07 NOTE — ED Notes (Signed)
Pt from home N/V/D started at 1230, no dizziness or blood in stool

## 2016-06-07 NOTE — H&P (Signed)
[name] [name] is an 56 y.o. female.   Chief Complaint: diverticulitis HPI:  She is a 56 year old female who comes the emergency department with new onset of left-sided abdominal pain today. She felt nauseated this morning. She had some sudden pain after eating dinner. She felt like having a bowel movement might make the pain better. She went to the bathroom multiple times but remained uncomfortable. The severe pain on the left side was worse than previous pain she has had acute diverticulitis, so she sought help in the emergency department. She felt better lying on her side rather than on her back earlier.  She has not had any fevers chills or jaundice. She denies dysuria.  She has a previous history of diverticulitis. She was admitted in 2014 for diverticulitis. She did have to have drain placement at that time, however she did not have surgery. Of note, Dr. Grandville Silos took out her gallbladder in 2014.  Past Medical History:  Diagnosis Date  . Anxiety disorder   . Diverticulitis   . Gallstones   . GERD (gastroesophageal reflux disease)   . Hyperlipidemia   . Hypertension   . Obesity   . Renal disorder    kidney stones    Past Surgical History:  Procedure Laterality Date  . CESAREAN SECTION    . CHOLECYSTECTOMY    . TONSILLECTOMY     as child-tonsils and adenoids  . TOTAL KNEE ARTHROPLASTY  11/23/2011   Procedure: TOTAL KNEE ARTHROPLASTY;  Surgeon: Mauri Pole, MD;  Location: [facility];  Service: Orthopedics;  Laterality: Right;  . uterine ablation  2003    Family History  Problem Relation Age of Onset  . Colon cancer Neg Hx   . Hypertension Mother   . Bone cancer Father   . Bladder Cancer Father    Social History:  reports that she has never smoked. She has never used smokeless tobacco. She reports that she drinks alcohol. She reports that she does not use drugs.  Allergies:  Allergies  Allergen Reactions  . Dilaudid [Hydromorphone Hcl] Itching  . Quinapril Hcl Hives and  Swelling  . Yellow Jacket Venom [Bee Venom] Swelling    Swelling at site of bite     (Not in a hospital admission)  Results for orders placed or performed during the hospital encounter of 06/07/16 (from the past 48 hour(s))  CBC with Differential/Platelet     Status: Abnormal   Collection Time: 06/07/16  6:45 PM  Result Value Ref Range   WBC 12.8 (H) 4.0 - 10.5 K/uL   RBC 4.85 3.87 - 5.11 MIL/uL   Hemoglobin 14.5 12.0 - 15.0 g/dL   HCT 43.3 36.0 - 46.0 %   MCV 89.3 78.0 - 100.0 fL   MCH 29.9 26.0 - 34.0 pg   MCHC 33.5 30.0 - 36.0 g/dL   RDW 13.9 11.5 - 15.5 %   Platelets 213 150 - 400 K/uL   Neutrophils Relative % 88 %   Neutro Abs 11.2 (H) 1.7 - 7.7 K/uL   Lymphocytes Relative 6 %   Lymphs Abs 0.8 0.7 - 4.0 K/uL   Monocytes Relative 6 %   Monocytes Absolute 0.8 0.1 - 1.0 K/uL   Eosinophils Relative 0 %   Eosinophils Absolute 0.0 0.0 - 0.7 K/uL   Basophils Relative 0 %   Basophils Absolute 0.0 0.0 - 0.1 K/uL  Comprehensive metabolic panel     Status: Abnormal   Collection Time: 06/07/16  6:45 PM  Result Value Ref  Range   Sodium 139 135 - 145 mmol/L   Potassium 3.3 (L) 3.5 - 5.1 mmol/L   Chloride 101 101 - 111 mmol/L   CO2 25 22 - 32 mmol/L   Glucose, Bld 103 (H) 65 - 99 mg/dL   BUN 23 (H) 6 - 20 mg/dL   Creatinine, Ser 1.35 (H) 0.44 - 1.00 mg/dL   Calcium 9.6 8.9 - 10.3 mg/dL   Total Protein 7.7 6.5 - 8.1 g/dL   Albumin 4.2 3.5 - 5.0 g/dL   AST 34 15 - 41 U/L   ALT 32 14 - 54 U/L   Alkaline Phosphatase 76 38 - 126 U/L   Total Bilirubin 0.6 0.3 - 1.2 mg/dL   GFR calc non Af Amer 43 (L) >60 mL/min   GFR calc Af Amer 50 (L) >60 mL/min    Comment: (NOTE) The eGFR has been calculated using the CKD EPI equation. This calculation has not been validated in all clinical situations. eGFR's persistently <60 mL/min signify possible Chronic Kidney Disease.    Anion gap 13 5 - 15  Lipase, blood     Status: None   Collection Time: 06/07/16  6:45 PM  Result Value Ref Range    Lipase 20 11 - 51 U/L   Ct Renal Stone Study  Result Date: 06/07/2016 CLINICAL DATA:  Left flank pain starting this morning EXAM: CT ABDOMEN AND PELVIS WITHOUT CONTRAST TECHNIQUE: Multidetector CT imaging of the abdomen and pelvis was performed following the standard protocol without IV contrast. COMPARISON:  06/14/2015 FINDINGS: Lower chest: Lung bases shows no acute findings Hepatobiliary: Unenhanced liver shows no biliary ductal dilatation. Status post cholecystectomy. Pancreas: Unenhanced pancreas without focal abnormality. Spleen: Unenhanced spleen with no focal abnormality. Adrenals/Urinary Tract: No adrenal gland mass. No nephrolithiasis. No hydronephrosis or hydroureter. No calcified ureteral calculi. No calcified calculi are noted within under distended urinary bladder. Stomach/Bowel: No gastric outlet obstruction. No thickened or dilated small bowel loops. Moderate stool and some colonic gas noted within cecum. Normal retrocecal appendix noted in axial image 65. The terminal ileum is unremarkable. Some colonic gas noted within transverse colon. Multiple diverticula are noted descending colon. Multiple diverticula are noted sigmoid colon. Axial image 72 there is mild stranding of pericolonic fat and mild thickening of of colonic wall in proximal sigmoid colon. There is small amount of extraluminal air just anterior to proximal sigmoid colon measures 2 cm axial image 73. Findings are consistent with perforated acute diverticulitis. No pericolonic abscess is noted. Vascular/Lymphatic: No aortic aneurysm. No retroperitoneal or mesenteric adenopathy. Reproductive: The uterus is atrophic.  No adnexal mass. Other: Additional foci of free abdominal air are noted in right upper abdomen just anterior to the liver axial image 26. Tiny foci of free air noted in splenic hilum and anterior to the pancreas. Musculoskeletal: Sagittal images of the spine shows mild degenerative changes lower thoracic spine. Probable  Schmorl's node deformity upper endplate of J47 vertebral body. Significant disc space flattening at L5-S1 level. Stable mild anterolisthesis L5 on S1. IMPRESSION: 1. There is mild stranding of pericolonic fat and mild thickening of colonic wall axial image 72 proximal left colon left lower quadrant. Small extraluminal air measures 2 cm. Findings consistent with perforated acute diverticulitis. No pericolonic abscess is noted. Additional foci of free air are noted in upper right and left abdomen. 2. Moderate stool and some gas noted in cecum. No pericecal inflammation. Normal appendix. 3. No small bowel obstruction. 4. Degenerative changes thoracolumbar spine. 5. Status post cholecystectomy.  These results were called by telephone at the time of interpretation on 06/07/2016 at 9:05 pm to Dr. Tanna Furry , who verbally acknowledged these results. Electronically Signed   By: Lahoma Crocker M.D.   On: 06/07/2016 21:05    Review of Systems  Constitutional: Negative.   HENT: Negative.   Eyes: Negative.   Respiratory: Negative.   Cardiovascular: Negative.   Gastrointestinal: Positive for abdominal pain, constipation and nausea. Negative for diarrhea.  Genitourinary: Negative.   Musculoskeletal: Negative.   Skin: Negative.   Neurological: Negative.   Endo/Heme/Allergies: Negative.   Psychiatric/Behavioral: Negative.   All other systems reviewed and are negative.   Blood pressure 113/78, pulse 93, SpO2 92 %. Physical Exam  Constitutional: She is oriented to person, place, and time. She appears well-developed and well-nourished. No distress.  HENT:  Head: Normocephalic and atraumatic.  Right Ear: External ear normal.  Left Ear: External ear normal.  Mouth/Throat: Oropharynx is clear and moist.  Eyes: Conjunctivae are normal. Pupils are equal, round, and reactive to light. Right eye exhibits no discharge. Left eye exhibits no discharge. No scleral icterus.  Neck: Normal range of motion. Neck supple. No  tracheal deviation present. No thyromegaly present.  Cardiovascular: Normal rate, regular rhythm, normal heart sounds and intact distal pulses.  Exam reveals no gallop and no friction rub.   No murmur heard. Respiratory: Effort normal and breath sounds normal. No respiratory distress. She exhibits no tenderness.  GI: Soft. Bowel sounds are normal. She exhibits distension (mild). There is tenderness (LLQ, left lateral abdomen). There is no rebound and no guarding.  Musculoskeletal: Normal range of motion. She exhibits no edema or tenderness.  Neurological: She is alert and oriented to person, place, and time. Coordination normal.  Skin: Skin is warm and dry. No rash noted. She is not diaphoretic. No erythema. No pallor.  Psychiatric: She has a normal mood and affect. Her behavior is normal. Judgment and thought content normal.     Assessment/Plan Sigmoid diverticulitis with adjacent extraluminal air Hypokalemia Seasonal allergies Osteoarthritis Depression HTN Chronic kidney disease stage I, acute kidney injury from dehydration  NPO IV fluids IV antibiotics. Discussed that she may need surgery this admission.  Replete potassium Continue home meds for HTN.    Stark Klein, MD 06/07/2016, 11:57 PM

## 2016-06-07 NOTE — ED Notes (Signed)
Patient transported to CT 

## 2016-06-08 ENCOUNTER — Encounter (HOSPITAL_COMMUNITY): Payer: Self-pay | Admitting: *Deleted

## 2016-06-08 LAB — URINALYSIS, ROUTINE W REFLEX MICROSCOPIC
BILIRUBIN URINE: NEGATIVE
GLUCOSE, UA: NEGATIVE mg/dL
HGB URINE DIPSTICK: NEGATIVE
Ketones, ur: NEGATIVE mg/dL
Leukocytes, UA: NEGATIVE
Nitrite: NEGATIVE
Protein, ur: NEGATIVE mg/dL
SPECIFIC GRAVITY, URINE: 1.017 (ref 1.005–1.030)
pH: 5 (ref 5.0–8.0)

## 2016-06-08 LAB — MAGNESIUM: MAGNESIUM: 2 mg/dL (ref 1.7–2.4)

## 2016-06-08 LAB — BASIC METABOLIC PANEL
ANION GAP: 11 (ref 5–15)
BUN: 23 mg/dL — ABNORMAL HIGH (ref 6–20)
CHLORIDE: 102 mmol/L (ref 101–111)
CO2: 27 mmol/L (ref 22–32)
Calcium: 8.7 mg/dL — ABNORMAL LOW (ref 8.9–10.3)
Creatinine, Ser: 1.45 mg/dL — ABNORMAL HIGH (ref 0.44–1.00)
GFR calc non Af Amer: 39 mL/min — ABNORMAL LOW (ref >60)
GFR, EST AFRICAN AMERICAN: 46 mL/min — AB (ref >60)
Glucose, Bld: 106 mg/dL — ABNORMAL HIGH (ref 65–99)
POTASSIUM: 3.3 mmol/L — AB (ref 3.5–5.1)
SODIUM: 140 mmol/L (ref 135–145)

## 2016-06-08 LAB — CBC
HCT: 40.3 % (ref 36.0–46.0)
HEMOGLOBIN: 13.2 g/dL (ref 12.0–15.0)
MCH: 29.6 pg (ref 26.0–34.0)
MCHC: 32.8 g/dL (ref 30.0–36.0)
MCV: 90.4 fL (ref 78.0–100.0)
Platelets: 195 10*3/uL (ref 150–400)
RBC: 4.46 MIL/uL (ref 3.87–5.11)
RDW: 14.4 % (ref 11.5–15.5)
WBC: 14.8 10*3/uL — ABNORMAL HIGH (ref 4.0–10.5)

## 2016-06-08 LAB — PHOSPHORUS: PHOSPHORUS: 3.9 mg/dL (ref 2.5–4.6)

## 2016-06-08 LAB — PROTIME-INR
INR: 1.09
PROTHROMBIN TIME: 14.2 s (ref 11.4–15.2)

## 2016-06-08 LAB — HIV ANTIBODY (ROUTINE TESTING W REFLEX): HIV SCREEN 4TH GENERATION: NONREACTIVE

## 2016-06-08 MED ORDER — ONDANSETRON HCL 4 MG/2ML IJ SOLN
4.0000 mg | Freq: Four times a day (QID) | INTRAMUSCULAR | Status: DC | PRN
  Administered 2016-06-08 – 2016-06-09 (×3): 4 mg via INTRAVENOUS
  Filled 2016-06-08 (×3): qty 2

## 2016-06-08 MED ORDER — ROPINIROLE HCL 0.25 MG PO TABS
0.2500 mg | ORAL_TABLET | Freq: Every day | ORAL | Status: DC
  Administered 2016-06-08 – 2016-06-16 (×10): 0.25 mg via ORAL
  Filled 2016-06-08 (×11): qty 1

## 2016-06-08 MED ORDER — METHOCARBAMOL 500 MG PO TABS: 500.0000 mg | ORAL_TABLET | Freq: Four times a day (QID) | ORAL | Status: DC | PRN

## 2016-06-08 MED ORDER — PANTOPRAZOLE SODIUM 40 MG PO TBEC
40.0000 mg | DELAYED_RELEASE_TABLET | Freq: Every day | ORAL | Status: DC
  Administered 2016-06-08 – 2016-06-16 (×8): 40 mg via ORAL
  Filled 2016-06-08 (×8): qty 1

## 2016-06-08 MED ORDER — OXYCODONE HCL 5 MG PO TABS
5.0000 mg | ORAL_TABLET | ORAL | Status: DC | PRN
  Administered 2016-06-08 (×3): 10 mg via ORAL
  Administered 2016-06-08: 5 mg via ORAL
  Administered 2016-06-09 (×4): 10 mg via ORAL
  Administered 2016-06-13: 5 mg via ORAL
  Administered 2016-06-14 – 2016-06-15 (×5): 10 mg via ORAL
  Administered 2016-06-16 (×2): 5 mg via ORAL
  Filled 2016-06-08 (×5): qty 2
  Filled 2016-06-08: qty 1
  Filled 2016-06-08 (×2): qty 2
  Filled 2016-06-08: qty 1
  Filled 2016-06-08 (×6): qty 2
  Filled 2016-06-08: qty 1

## 2016-06-08 MED ORDER — LORATADINE 10 MG PO TABS: 10.0000 mg | ORAL_TABLET | Freq: Every day | ORAL | Status: DC | PRN

## 2016-06-08 MED ORDER — ACETAMINOPHEN 325 MG PO TABS: 650.0000 mg | ORAL_TABLET | Freq: Four times a day (QID) | ORAL | Status: DC | PRN

## 2016-06-08 MED ORDER — ACETAMINOPHEN 650 MG RE SUPP: 650.0000 mg | Freq: Four times a day (QID) | RECTAL | Status: DC | PRN

## 2016-06-08 MED ORDER — DIPHENHYDRAMINE HCL 50 MG/ML IJ SOLN: 12.5000 mg | Freq: Four times a day (QID) | INTRAMUSCULAR | Status: DC | PRN

## 2016-06-08 MED ORDER — ENOXAPARIN SODIUM 40 MG/0.4ML ~~LOC~~ SOLN
40.0000 mg | Freq: Every day | SUBCUTANEOUS | Status: DC
  Administered 2016-06-08 – 2016-06-14 (×6): 40 mg via SUBCUTANEOUS
  Filled 2016-06-08 (×6): qty 0.4

## 2016-06-08 MED ORDER — DIPHENHYDRAMINE HCL 12.5 MG/5ML PO ELIX
12.5000 mg | ORAL_SOLUTION | Freq: Four times a day (QID) | ORAL | Status: DC | PRN
  Administered 2016-06-09: 12.5 mg via ORAL
  Filled 2016-06-08: qty 10

## 2016-06-08 MED ORDER — TRIAMTERENE-HCTZ 37.5-25 MG PO TABS
1.0000 | ORAL_TABLET | Freq: Every day | ORAL | Status: DC
  Administered 2016-06-08: 1 via ORAL
  Filled 2016-06-08 (×2): qty 1

## 2016-06-08 MED ORDER — DEXTROSE IN LACTATED RINGERS 5 % IV SOLN
INTRAVENOUS | Status: DC
  Administered 2016-06-08: 03:00:00 via INTRAVENOUS

## 2016-06-08 MED ORDER — SERTRALINE HCL 100 MG PO TABS
100.0000 mg | ORAL_TABLET | Freq: Every day | ORAL | Status: DC
  Administered 2016-06-08 – 2016-06-16 (×8): 100 mg via ORAL
  Filled 2016-06-08 (×8): qty 1

## 2016-06-08 MED ORDER — ONDANSETRON 4 MG PO TBDP: 4.0000 mg | ORAL_TABLET | Freq: Four times a day (QID) | ORAL | Status: DC | PRN

## 2016-06-08 MED ORDER — PANTOPRAZOLE SODIUM 40 MG IV SOLR: 40.0000 mg | Freq: Every day | INTRAVENOUS | Status: DC

## 2016-06-08 MED ORDER — PIPERACILLIN-TAZOBACTAM 3.375 G IVPB
3.3750 g | Freq: Three times a day (TID) | INTRAVENOUS | Status: DC
  Administered 2016-06-08 – 2016-06-09 (×4): 3.375 g via INTRAVENOUS
  Filled 2016-06-08 (×5): qty 50

## 2016-06-08 MED ORDER — HYDRALAZINE HCL 20 MG/ML IJ SOLN: 10.0000 mg | INTRAMUSCULAR | Status: DC | PRN

## 2016-06-08 MED ORDER — KCL IN DEXTROSE-NACL 40-5-0.45 MEQ/L-%-% IV SOLN
INTRAVENOUS | Status: DC
  Administered 2016-06-08 – 2016-06-14 (×13): via INTRAVENOUS
  Filled 2016-06-08 (×17): qty 1000

## 2016-06-08 MED ORDER — FLUTICASONE PROPIONATE 50 MCG/ACT NA SUSP: 1.0000 | Freq: Every day | NASAL | Status: DC | PRN

## 2016-06-08 MED ORDER — MORPHINE SULFATE (PF) 2 MG/ML IV SOLN
1.0000 mg | INTRAVENOUS | Status: DC | PRN
  Administered 2016-06-08 (×3): 2 mg via INTRAVENOUS
  Filled 2016-06-08 (×4): qty 1

## 2016-06-08 NOTE — Progress Notes (Signed)
[name] [name] [id number] Admitted to 1Y07: 06/08/2016 2:28 AM Attending Provider: Nolon Nations, MD    [name] [name] is a 56 y.o. female patient admitted from ED awake, alert  & orientated  X 3,  Full Code, VSS - Blood pressure 102/62, pulse 85, temperature 98.3 F (36.8 C), resp. rate 20, SpO2 97 %., O2 2 L nasal cannular, no c/o shortness of breath, no c/o chest pain, no distress noted.   IV site WDL:  with a transparent dsg that's clean dry and intact.  Allergies:   Allergies  Allergen Reactions  . Dilaudid [Hydromorphone Hcl] Itching  . Quinapril Hcl Hives and Swelling  . Yellow Jacket Venom [Bee Venom] Swelling    Swelling at site of bite     Past Medical History:  Diagnosis Date  . Anxiety disorder   . Diverticulitis   . Gallstones   . GERD (gastroesophageal reflux disease)   . Hyperlipidemia   . Hypertension   . Obesity   . Renal disorder    kidney stones    History:  obtained from patient  Pt orientation to unit, room and routine. Information packet given to patient.  Admission INP armband ID verified with patient, and in place. SR up x 2, fall risk assessment complete with Patient verbalizing understanding of risks associated with falls. Pt verbalizes an understanding of how to use the call bell and to call for help before getting out of bed.  Skin, clean-dry- without evidence of bruising, or skin tears.  MASD in groin folds; cleansed and dried.  Will cont to monitor and assist as needed.  [name] Ames, RN 06/08/2016 2:28 AM

## 2016-06-08 NOTE — Progress Notes (Signed)
[facility] Progress Note     Subjective: CC: abdominal pain.  Pt reports her LLQ pain is becoming more diffuse, but pain severity unchanged compared to yesterday. Nausea/vomiting improved. Passing minimal flatus. Denies BM. Reports episode of diverticulitis 4 y ago requiring inpatient treatment followed by 3-4 episodes treated outpatient with PO abx. Pt states that this is "by far" her worse episode yet.   Objective: Vital signs in last 24 hours: Temp:  [98.2 F (36.8 C)-98.3 F (36.8 C)] 98.2 F (36.8 C) (04/16 0530) Pulse Rate:  [73-103] 73 (04/16 0530) Resp:  [18-20] 18 (04/16 0530) BP: (100-140)/(61-96) 112/61 (04/16 0530) SpO2:  [91 %-98 %] 97 % (04/16 0530) Weight:  [120.8 kg (266 lb 4.8 oz)] 120.8 kg (266 lb 4.8 oz) (04/16 0253) Last BM Date: 06/07/16  Intake/Output from previous day: 04/15 0701 - 04/16 0700 In: 550 [IV Piggyback:550] Out: 200 [Urine:200] Intake/Output this shift: No intake/output data recorded.  PE: Gen:  Alert, pleasant and cooperative HEENT: PERRL Card:  Regular rate and rhythm, pedal pulses 2+ Pulm:  Non-labored, clear to auscultation bilaterally Abd: Soft, diffusely TTP, worse over LLQ without peritonitis, hypoactive BS Ext:  No erythema, edema, or tenderness  Psych: A&Ox3  Lab Results:   Recent Labs  06/07/16 1845 06/08/16 0410  WBC 12.8* 14.8*  HGB 14.5 13.2  HCT 43.3 40.3  PLT 213 195   BMET  Recent Labs  06/07/16 1845 06/08/16 0410  NA 139 140  K 3.3* 3.3*  CL 101 102  CO2 25 27  GLUCOSE 103* 106*  BUN 23* 23*  CREATININE 1.35* 1.45*  CALCIUM 9.6 8.7*   PT/INR  Recent Labs  06/08/16 0410  LABPROT 14.2  INR 1.09   CMP     Component Value Date/Time   NA 140 06/08/2016 0410   K 3.3 (L) 06/08/2016 0410   CL 102 06/08/2016 0410   CO2 27 06/08/2016 0410   GLUCOSE 106 (H) 06/08/2016 0410   BUN 23 (H) 06/08/2016 0410   CREATININE 1.45 (H) 06/08/2016 0410   CALCIUM 8.7 (L) 06/08/2016 0410   PROT  7.7 06/07/2016 1845   ALBUMIN 4.2 06/07/2016 1845   AST 34 06/07/2016 1845   ALT 32 06/07/2016 1845   ALKPHOS 76 06/07/2016 1845   BILITOT 0.6 06/07/2016 1845   GFRNONAA 39 (L) 06/08/2016 0410   GFRAA 46 (L) 06/08/2016 0410   Lipase     Component Value Date/Time   LIPASE 20 06/07/2016 1845       Studies/Results: Ct Renal Stone Study  Result Date: 06/07/2016 CLINICAL DATA:  Left flank pain starting this morning EXAM: CT ABDOMEN AND PELVIS WITHOUT CONTRAST TECHNIQUE: Multidetector CT imaging of the abdomen and pelvis was performed following the standard protocol without IV contrast. COMPARISON:  06/14/2015 FINDINGS: Lower chest: Lung bases shows no acute findings Hepatobiliary: Unenhanced liver shows no biliary ductal dilatation. Status post cholecystectomy. Pancreas: Unenhanced pancreas without focal abnormality. Spleen: Unenhanced spleen with no focal abnormality. Adrenals/Urinary Tract: No adrenal gland mass. No nephrolithiasis. No hydronephrosis or hydroureter. No calcified ureteral calculi. No calcified calculi are noted within under distended urinary bladder. Stomach/Bowel: No gastric outlet obstruction. No thickened or dilated small bowel loops. Moderate stool and some colonic gas noted within cecum. Normal retrocecal appendix noted in axial image 65. The terminal ileum is unremarkable. Some colonic gas noted within transverse colon. Multiple diverticula are noted descending colon. Multiple diverticula are noted sigmoid colon. Axial image 72 there is mild stranding of pericolonic fat and  mild thickening of of colonic wall in proximal sigmoid colon. There is small amount of extraluminal air just anterior to proximal sigmoid colon measures 2 cm axial image 73. Findings are consistent with perforated acute diverticulitis. No pericolonic abscess is noted. Vascular/Lymphatic: No aortic aneurysm. No retroperitoneal or mesenteric adenopathy. Reproductive: The uterus is atrophic.  No adnexal mass.  Other: Additional foci of free abdominal air are noted in right upper abdomen just anterior to the liver axial image 26. Tiny foci of free air noted in splenic hilum and anterior to the pancreas. Musculoskeletal: Sagittal images of the spine shows mild degenerative changes lower thoracic spine. Probable Schmorl's node deformity upper endplate of Y56 vertebral body. Significant disc space flattening at L5-S1 level. Stable mild anterolisthesis L5 on S1. IMPRESSION: 1. There is mild stranding of pericolonic fat and mild thickening of colonic wall axial image 72 proximal left colon left lower quadrant. Small extraluminal air measures 2 cm. Findings consistent with perforated acute diverticulitis. No pericolonic abscess is noted. Additional foci of free air are noted in upper right and left abdomen. 2. Moderate stool and some gas noted in cecum. No pericecal inflammation. Normal appendix. 3. No small bowel obstruction. 4. Degenerative changes thoracolumbar spine. 5. Status post cholecystectomy. These results were called by telephone at the time of interpretation on 06/07/2016 at 9:05 pm to Dr. Tanna Furry , who verbally acknowledged these results. Electronically Signed   By: Lahoma Crocker M.D.   On: 06/07/2016 21:05    Anti-infectives: Anti-infectives    Start     Dose/Rate Route Frequency Ordered Stop   06/08/16 0600  piperacillin-tazobactam (ZOSYN) IVPB 3.375 g     3.375 g 12.5 mL/hr over 240 Minutes Intravenous Every 8 hours 06/08/16 0228     06/07/16 2115  piperacillin-tazobactam (ZOSYN) IVPB 3.375 g     3.375 g 100 mL/hr over 30 Minutes Intravenous  Once 06/07/16 2108 06/07/16 2200     Assessment/Plan Acute sigmoid diverticulitis w/ adjacent extraluminal air - bowel rest, pain control, IV abx  Leukocytosis - 2/2 above, 14.8 from 11.9 HTN - home meds, hydralazine PRN CKD 1 w/ AKI - IVF, SCr 1.45, was 1.06 05/25/16  Depression - Zoloft  OA  FEN - hypokalemia,  ID: Zosyn 4/15 >> VTE: SCD's, Lovenox   Dispo: floor, IV abx, replete potassium  If patient continues to worsen clinically and objectively, she will likely require sigmoid colectomy/colostomy this admission .   LOS: 1 day    [facility] 06/08/2016, 8:10 AM Pager: [phone] Consults: [phone] Mon-Fri 7:00 am-4:30 pm Sat-Sun 7:00 am-11:30 am

## 2016-06-09 LAB — CBC
HCT: 36.7 % (ref 36.0–46.0)
Hemoglobin: 11.7 g/dL — ABNORMAL LOW (ref 12.0–15.0)
MCH: 29.2 pg (ref 26.0–34.0)
MCHC: 31.9 g/dL (ref 30.0–36.0)
MCV: 91.5 fL (ref 78.0–100.0)
Platelets: 184 10*3/uL (ref 150–400)
RBC: 4.01 MIL/uL (ref 3.87–5.11)
RDW: 14.8 % (ref 11.5–15.5)
WBC: 13.2 10*3/uL — ABNORMAL HIGH (ref 4.0–10.5)

## 2016-06-09 LAB — BASIC METABOLIC PANEL
Anion gap: 8 (ref 5–15)
BUN: 18 mg/dL (ref 6–20)
CHLORIDE: 101 mmol/L (ref 101–111)
CO2: 27 mmol/L (ref 22–32)
CREATININE: 1.66 mg/dL — AB (ref 0.44–1.00)
Calcium: 8.6 mg/dL — ABNORMAL LOW (ref 8.9–10.3)
GFR calc Af Amer: 39 mL/min — ABNORMAL LOW (ref >60)
GFR calc non Af Amer: 33 mL/min — ABNORMAL LOW (ref >60)
GLUCOSE: 121 mg/dL — AB (ref 65–99)
Potassium: 4 mmol/L (ref 3.5–5.1)
Sodium: 136 mmol/L (ref 135–145)

## 2016-06-09 LAB — PHOSPHORUS: Phosphorus: 3 mg/dL (ref 2.5–4.6)

## 2016-06-09 MED ORDER — SODIUM CHLORIDE 0.9 % IV SOLN
1.0000 g | INTRAVENOUS | Status: AC
  Administered 2016-06-09 – 2016-06-16 (×8): 1 g via INTRAVENOUS
  Filled 2016-06-09 (×9): qty 1

## 2016-06-09 MED ORDER — CALCIUM CARBONATE ANTACID 500 MG PO CHEW
1.0000 | CHEWABLE_TABLET | Freq: Every day | ORAL | Status: DC
  Administered 2016-06-09: 200 mg via ORAL
  Filled 2016-06-09: qty 1

## 2016-06-09 MED ORDER — CALCIUM GLUCONATE 500 MG PO TABS
1.0000 | ORAL_TABLET | Freq: Three times a day (TID) | ORAL | Status: DC
  Filled 2016-06-09: qty 1

## 2016-06-09 MED ORDER — WHITE PETROLATUM GEL
Status: AC
  Administered 2016-06-09: 15:00:00
  Filled 2016-06-09: qty 1

## 2016-06-09 NOTE — Progress Notes (Signed)
[facility] Progress Note     Subjective: CC: abdominal pain Pain the same or mildly improved compared to yesterday. Reports nausea last night. Denies nausea, vomiting, or bowel movement. Reports urinating regularly. Denies fever.  Of note, pt states that at her last physical her PCP told her that "my kidneys were not working exactly like they are supposed to, but he was not worried about it".   Objective: Vital signs in last 24 hours: Temp:  [98.6 F (37 C)-99.2 F (37.3 C)] 99 F (37.2 C) (04/17 0505) Pulse Rate:  [68-77] 69 (04/17 0505) Resp:  [18-20] 18 (04/17 0505) BP: (104-128)/(62-68) 110/62 (04/17 0505) SpO2:  [95 %-99 %] 95 % (04/17 0505) Last BM Date: 06/07/16  Intake/Output from previous day: 04/16 0701 - 04/17 0700 In: 837.5 [I.V.:737.5; IV Piggyback:100] Out: -  Intake/Output this shift: No intake/output data recorded.  PE: Gen:  Alert, cooperative, appears uncomfortable HEENT: PERRL Card:  Regular rate and rhythm, pedal pulses 2+ Pulm:  Non-labored, clear to auscultation bilaterally Abd: Soft, +BS, significant LLQ tenderness. LUQ and RUQ also tender with radiation to LLQ. Ext:  No erythema, edema, or tenderness  Psych: A&Ox3  Lab Results:   Recent Labs  06/08/16 0410 06/09/16 0445  WBC 14.8* 13.2*  HGB 13.2 11.7*  HCT 40.3 36.7  PLT 195 184   BMET  Recent Labs  06/08/16 0410 06/09/16 0445  NA 140 136  K 3.3* 4.0  CL 102 101  CO2 27 27  GLUCOSE 106* 121*  BUN 23* 18  CREATININE 1.45* 1.66*  CALCIUM 8.7* 8.6*   PT/INR  Recent Labs  06/08/16 0410  LABPROT 14.2  INR 1.09   CMP     Component Value Date/Time   NA 136 06/09/2016 0445   K 4.0 06/09/2016 0445   CL 101 06/09/2016 0445   CO2 27 06/09/2016 0445   GLUCOSE 121 (H) 06/09/2016 0445   BUN 18 06/09/2016 0445   CREATININE 1.66 (H) 06/09/2016 0445   CALCIUM 8.6 (L) 06/09/2016 0445   PROT 7.7 06/07/2016 1845   ALBUMIN 4.2 06/07/2016 1845   AST 34 06/07/2016  1845   ALT 32 06/07/2016 1845   ALKPHOS 76 06/07/2016 1845   BILITOT 0.6 06/07/2016 1845   GFRNONAA 33 (L) 06/09/2016 0445   GFRAA 39 (L) 06/09/2016 0445   Lipase     Component Value Date/Time   LIPASE 20 06/07/2016 1845   Studies/Results: Ct Renal Stone Study  Result Date: 06/07/2016 CLINICAL DATA:  Left flank pain starting this morning EXAM: CT ABDOMEN AND PELVIS WITHOUT CONTRAST TECHNIQUE: Multidetector CT imaging of the abdomen and pelvis was performed following the standard protocol without IV contrast. COMPARISON:  06/14/2015 FINDINGS: Lower chest: Lung bases shows no acute findings Hepatobiliary: Unenhanced liver shows no biliary ductal dilatation. Status post cholecystectomy. Pancreas: Unenhanced pancreas without focal abnormality. Spleen: Unenhanced spleen with no focal abnormality. Adrenals/Urinary Tract: No adrenal gland mass. No nephrolithiasis. No hydronephrosis or hydroureter. No calcified ureteral calculi. No calcified calculi are noted within under distended urinary bladder. Stomach/Bowel: No gastric outlet obstruction. No thickened or dilated small bowel loops. Moderate stool and some colonic gas noted within cecum. Normal retrocecal appendix noted in axial image 65. The terminal ileum is unremarkable. Some colonic gas noted within transverse colon. Multiple diverticula are noted descending colon. Multiple diverticula are noted sigmoid colon. Axial image 72 there is mild stranding of pericolonic fat and mild thickening of of colonic wall in proximal sigmoid colon. There is small amount  of extraluminal air just anterior to proximal sigmoid colon measures 2 cm axial image 73. Findings are consistent with perforated acute diverticulitis. No pericolonic abscess is noted. Vascular/Lymphatic: No aortic aneurysm. No retroperitoneal or mesenteric adenopathy. Reproductive: The uterus is atrophic.  No adnexal mass. Other: Additional foci of free abdominal air are noted in right upper abdomen  just anterior to the liver axial image 26. Tiny foci of free air noted in splenic hilum and anterior to the pancreas. Musculoskeletal: Sagittal images of the spine shows mild degenerative changes lower thoracic spine. Probable Schmorl's node deformity upper endplate of Y86 vertebral body. Significant disc space flattening at L5-S1 level. Stable mild anterolisthesis L5 on S1. IMPRESSION: 1. There is mild stranding of pericolonic fat and mild thickening of colonic wall axial image 72 proximal left colon left lower quadrant. Small extraluminal air measures 2 cm. Findings consistent with perforated acute diverticulitis. No pericolonic abscess is noted. Additional foci of free air are noted in upper right and left abdomen. 2. Moderate stool and some gas noted in cecum. No pericecal inflammation. Normal appendix. 3. No small bowel obstruction. 4. Degenerative changes thoracolumbar spine. 5. Status post cholecystectomy. These results were called by telephone at the time of interpretation on 06/07/2016 at 9:05 pm to Dr. Tanna Furry , who verbally acknowledged these results. Electronically Signed   By: Lahoma Crocker M.D.   On: 06/07/2016 21:05    Anti-infectives: Anti-infectives    Start     Dose/Rate Route Frequency Ordered Stop   06/08/16 0600  piperacillin-tazobactam (ZOSYN) IVPB 3.375 g     3.375 g 12.5 mL/hr over 240 Minutes Intravenous Every 8 hours 06/08/16 0228     06/07/16 2115  piperacillin-tazobactam (ZOSYN) IVPB 3.375 g     3.375 g 100 mL/hr over 30 Minutes Intravenous  Once 06/07/16 2108 06/07/16 2200     Assessment/Plan Acute sigmoid diverticulitis w/ adjacent extraluminal air - bowel rest, pain control, IV abx  Leukocytosis - 2/2 above, 13.2 from 14.8 HTN - home meds, hydralazine PRN CKD 1 w/ AKI - IVF, SCr 1.45>>1.66  Depression - Zoloft  OA  FEN - hypokalemia resolved, hypocalcemia (replete PO), NPO, IVF   ID: Zosyn 4/15-4/17, [name] 4/17 >>  VTE: SCD's, Lovenox  Dispo: floor, switch to IV  Invanz AKI worsening - d/c home med triamterene as it can be nephrotoxic OR tomorrow for open sigmoid colectomy, possible colostomy.     LOS: 2 days    Jill Alexanders , [facility] 06/09/2016, 9:17 AM Pager: [phone] Consults: [phone] Mon-Fri 7:00 am-4:30 pm Sat-Sun 7:00 am-11:30 am

## 2016-06-10 ENCOUNTER — Encounter (HOSPITAL_COMMUNITY): Payer: Self-pay | Admitting: Certified Registered Nurse Anesthetist

## 2016-06-10 ENCOUNTER — Encounter (HOSPITAL_COMMUNITY): Admission: EM | Disposition: A | Discharge status: Home Health | Payer: Self-pay | Source: Home / Self Care

## 2016-06-10 ENCOUNTER — Inpatient Hospital Stay (HOSPITAL_COMMUNITY): Payer: BC Managed Care – PPO | Admitting: Certified Registered Nurse Anesthetist

## 2016-06-10 DIAGNOSIS — K5780 Diverticulitis of intestine, part unspecified, with perforation and abscess without bleeding: Secondary | ICD-10-CM

## 2016-06-10 HISTORY — DX: Diverticulitis of intestine, part unspecified, with perforation and abscess without bleeding: K57.80

## 2016-06-10 HISTORY — PX: COLECTOMY WITH COLOSTOMY CREATION/HARTMANN PROCEDURE: SHX6598

## 2016-06-10 HISTORY — PX: COLOSTOMY: SHX63

## 2016-06-10 LAB — CBC
HCT: 35 % — ABNORMAL LOW (ref 36.0–46.0)
HCT: 34.7 % — ABNORMAL LOW (ref 36.0–46.0)
Hemoglobin: 11.3 g/dL — ABNORMAL LOW (ref 12.0–15.0)
Hemoglobin: 11.1 g/dL — ABNORMAL LOW (ref 12.0–15.0)
MCH: 29.8 pg (ref 26.0–34.0)
MCH: 29.3 pg (ref 26.0–34.0)
MCHC: 32.3 g/dL (ref 30.0–36.0)
MCHC: 32 g/dL (ref 30.0–36.0)
MCV: 92.3 fL (ref 78.0–100.0)
MCV: 91.6 fL (ref 78.0–100.0)
PLATELETS: 200 10*3/uL (ref 150–400)
PLATELETS: 212 10*3/uL (ref 150–400)
RBC: 3.79 MIL/uL — ABNORMAL LOW (ref 3.87–5.11)
RBC: 3.79 MIL/uL — ABNORMAL LOW (ref 3.87–5.11)
RDW: 14.9 % (ref 11.5–15.5)
RDW: 14.8 % (ref 11.5–15.5)
WBC: 10.9 10*3/uL — ABNORMAL HIGH (ref 4.0–10.5)
WBC: 13.7 10*3/uL — ABNORMAL HIGH (ref 4.0–10.5)

## 2016-06-10 LAB — SURGICAL PCR SCREEN
MRSA, PCR: NEGATIVE
Staphylococcus aureus: NEGATIVE

## 2016-06-10 LAB — BASIC METABOLIC PANEL
Anion gap: 7 (ref 5–15)
BUN: 16 mg/dL (ref 6–20)
CALCIUM: 8.7 mg/dL — AB (ref 8.9–10.3)
CO2: 26 mmol/L (ref 22–32)
Chloride: 102 mmol/L (ref 101–111)
Creatinine, Ser: 1.14 mg/dL — ABNORMAL HIGH (ref 0.44–1.00)
GFR calc Af Amer: >60 mL/min (ref >60)
GFR, EST NON AFRICAN AMERICAN: 53 mL/min — AB (ref >60)
Glucose, Bld: 128 mg/dL — ABNORMAL HIGH (ref 65–99)
POTASSIUM: 4.1 mmol/L (ref 3.5–5.1)
Sodium: 135 mmol/L (ref 135–145)

## 2016-06-10 SURGERY — COLECTOMY, WITH COLOSTOMY CREATION
Anesthesia: General | Site: Abdomen

## 2016-06-10 MED ORDER — SUGAMMADEX SODIUM 500 MG/5ML IV SOLN
INTRAVENOUS | Status: AC
  Filled 2016-06-10: qty 5

## 2016-06-10 MED ORDER — FENTANYL CITRATE (PF) 250 MCG/5ML IJ SOLN
INTRAMUSCULAR | Status: AC
  Filled 2016-06-10: qty 5

## 2016-06-10 MED ORDER — FENTANYL 40 MCG/ML IV SOLN
INTRAVENOUS | Status: DC
  Administered 2016-06-10: 165 ug via INTRAVENOUS
  Administered 2016-06-10: 1000 ug via INTRAVENOUS
  Administered 2016-06-11: 195 ug via INTRAVENOUS
  Administered 2016-06-11: 150 ug via INTRAVENOUS
  Administered 2016-06-11: 210 ug via INTRAVENOUS
  Administered 2016-06-11: 180 ug via INTRAVENOUS
  Administered 2016-06-11: 210 ug via INTRAVENOUS
  Administered 2016-06-11: 180 ug via INTRAVENOUS
  Administered 2016-06-11: 1000 ug via INTRAVENOUS
  Administered 2016-06-12: 135 ug via INTRAVENOUS
  Administered 2016-06-12: 90 ug via INTRAVENOUS
  Administered 2016-06-12: 75 ug via INTRAVENOUS
  Administered 2016-06-12: 135 ug via INTRAVENOUS
  Administered 2016-06-12: 165 ug via INTRAVENOUS
  Administered 2016-06-12 – 2016-06-13 (×2): 90 ug via INTRAVENOUS
  Administered 2016-06-13 (×3): 45 ug via INTRAVENOUS
  Administered 2016-06-13: 30 ug via INTRAVENOUS
  Administered 2016-06-14: 60 ug via INTRAVENOUS
  Administered 2016-06-14: 45 ug via INTRAVENOUS
  Administered 2016-06-14: 30 ug via INTRAVENOUS
  Filled 2016-06-10 (×3): qty 25

## 2016-06-10 MED ORDER — HYDROMORPHONE HCL 1 MG/ML IJ SOLN
INTRAMUSCULAR | Status: AC
  Administered 2016-06-10: 0.5 mg via INTRAVENOUS
  Filled 2016-06-10: qty 0.5

## 2016-06-10 MED ORDER — SUGAMMADEX SODIUM 500 MG/5ML IV SOLN
INTRAVENOUS | Status: DC | PRN
  Administered 2016-06-10: 250 mg via INTRAVENOUS

## 2016-06-10 MED ORDER — MORPHINE SULFATE (PF) 4 MG/ML IV SOLN: 1.0000 mg | INTRAVENOUS | Status: DC | PRN

## 2016-06-10 MED ORDER — DEXAMETHASONE SODIUM PHOSPHATE 10 MG/ML IJ SOLN
INTRAMUSCULAR | Status: DC | PRN
  Administered 2016-06-10: 10 mg via INTRAVENOUS

## 2016-06-10 MED ORDER — ALBUMIN HUMAN 5 % IV SOLN
12.5000 g | Freq: Once | INTRAVENOUS | Status: DC
  Filled 2016-06-10: qty 250

## 2016-06-10 MED ORDER — LIDOCAINE 2% (20 MG/ML) 5 ML SYRINGE
INTRAMUSCULAR | Status: AC
  Filled 2016-06-10: qty 5

## 2016-06-10 MED ORDER — FENTANYL CITRATE (PF) 100 MCG/2ML IJ SOLN
50.0000 ug | Freq: Once | INTRAMUSCULAR | Status: AC
  Administered 2016-06-10: 50 ug via INTRAVENOUS

## 2016-06-10 MED ORDER — PHENYLEPHRINE 40 MCG/ML (10ML) SYRINGE FOR IV PUSH (FOR BLOOD PRESSURE SUPPORT)
PREFILLED_SYRINGE | INTRAVENOUS | Status: AC
  Filled 2016-06-10: qty 10

## 2016-06-10 MED ORDER — DIPHENHYDRAMINE HCL 50 MG/ML IJ SOLN: 12.5000 mg | Freq: Four times a day (QID) | INTRAMUSCULAR | Status: DC | PRN

## 2016-06-10 MED ORDER — PROMETHAZINE HCL 25 MG/ML IJ SOLN: 6.2500 mg | INTRAMUSCULAR | Status: DC | PRN

## 2016-06-10 MED ORDER — HYDROMORPHONE HCL 1 MG/ML IJ SOLN
INTRAMUSCULAR | Status: AC
  Administered 2016-06-10: 0.5 mg via INTRAVENOUS
  Filled 2016-06-10: qty 1

## 2016-06-10 MED ORDER — LIDOCAINE HCL (CARDIAC) 20 MG/ML IV SOLN
INTRAVENOUS | Status: DC | PRN
  Administered 2016-06-10: 100 mg via INTRAVENOUS

## 2016-06-10 MED ORDER — PHENYLEPHRINE HCL 10 MG/ML IJ SOLN
INTRAMUSCULAR | Status: DC | PRN
  Administered 2016-06-10: 80 ug via INTRAVENOUS

## 2016-06-10 MED ORDER — ROCURONIUM BROMIDE 100 MG/10ML IV SOLN
INTRAVENOUS | Status: DC | PRN
  Administered 2016-06-10: 10 mg via INTRAVENOUS
  Administered 2016-06-10: 50 mg via INTRAVENOUS
  Administered 2016-06-10: 20 mg via INTRAVENOUS

## 2016-06-10 MED ORDER — LACTATED RINGERS IV SOLN
INTRAVENOUS | Status: DC
Start: 2016-06-10 — End: 2016-06-14
  Administered 2016-06-10 – 2016-06-11 (×4): via INTRAVENOUS

## 2016-06-10 MED ORDER — ONDANSETRON HCL 4 MG/2ML IJ SOLN
INTRAMUSCULAR | Status: AC
  Filled 2016-06-10: qty 2

## 2016-06-10 MED ORDER — NALOXONE HCL 0.4 MG/ML IJ SOLN: 0.4000 mg | INTRAMUSCULAR | Status: DC | PRN

## 2016-06-10 MED ORDER — FENTANYL CITRATE (PF) 100 MCG/2ML IJ SOLN
INTRAMUSCULAR | Status: AC
  Administered 2016-06-10: 50 ug via INTRAVENOUS
  Filled 2016-06-10: qty 2

## 2016-06-10 MED ORDER — SUCCINYLCHOLINE CHLORIDE 200 MG/10ML IV SOSY
PREFILLED_SYRINGE | INTRAVENOUS | Status: AC
  Filled 2016-06-10: qty 10

## 2016-06-10 MED ORDER — 0.9 % SODIUM CHLORIDE (POUR BTL) OPTIME
TOPICAL | Status: DC | PRN
  Administered 2016-06-10 (×2): 1000 mL

## 2016-06-10 MED ORDER — PROPOFOL 10 MG/ML IV BOLUS
INTRAVENOUS | Status: DC | PRN
  Administered 2016-06-10: 150 mg via INTRAVENOUS
  Administered 2016-06-10: 50 mg via INTRAVENOUS

## 2016-06-10 MED ORDER — DIPHENHYDRAMINE HCL 12.5 MG/5ML PO ELIX: 12.5000 mg | ORAL_SOLUTION | Freq: Four times a day (QID) | ORAL | Status: DC | PRN

## 2016-06-10 MED ORDER — PROPOFOL 10 MG/ML IV BOLUS
INTRAVENOUS | Status: AC
  Filled 2016-06-10: qty 20

## 2016-06-10 MED ORDER — ALBUMIN HUMAN 5 % IV SOLN
INTRAVENOUS | Status: DC | PRN
  Administered 2016-06-10: 13:00:00 via INTRAVENOUS

## 2016-06-10 MED ORDER — HEMOSTATIC AGENTS (NO CHARGE) OPTIME
TOPICAL | Status: DC | PRN
  Administered 2016-06-10 (×3): 1 via TOPICAL

## 2016-06-10 MED ORDER — MIDAZOLAM HCL 2 MG/2ML IJ SOLN
INTRAMUSCULAR | Status: AC
  Filled 2016-06-10: qty 2

## 2016-06-10 MED ORDER — MEPERIDINE HCL 25 MG/ML IJ SOLN: 6.2500 mg | INTRAMUSCULAR | Status: DC | PRN

## 2016-06-10 MED ORDER — SODIUM CHLORIDE 0.9% FLUSH: 9.0000 mL | INTRAVENOUS | Status: DC | PRN

## 2016-06-10 MED ORDER — HYDROMORPHONE HCL 1 MG/ML IJ SOLN
0.2500 mg | INTRAMUSCULAR | Status: DC | PRN
  Administered 2016-06-10 (×2): 0.5 mg via INTRAVENOUS

## 2016-06-10 MED ORDER — SUCCINYLCHOLINE CHLORIDE 20 MG/ML IJ SOLN
INTRAMUSCULAR | Status: DC | PRN
  Administered 2016-06-10: 140 mg via INTRAVENOUS

## 2016-06-10 MED ORDER — MIDAZOLAM HCL 5 MG/5ML IJ SOLN
INTRAMUSCULAR | Status: DC | PRN
  Administered 2016-06-10: 1 mg via INTRAVENOUS

## 2016-06-10 MED ORDER — DEXAMETHASONE SODIUM PHOSPHATE 10 MG/ML IJ SOLN
INTRAMUSCULAR | Status: AC
  Filled 2016-06-10: qty 1

## 2016-06-10 MED ORDER — ROCURONIUM BROMIDE 50 MG/5ML IV SOSY
PREFILLED_SYRINGE | INTRAVENOUS | Status: AC
  Filled 2016-06-10: qty 5

## 2016-06-10 MED ORDER — ONDANSETRON HCL 4 MG/2ML IJ SOLN: 4.0000 mg | Freq: Four times a day (QID) | INTRAMUSCULAR | Status: DC | PRN

## 2016-06-10 MED ORDER — FENTANYL CITRATE (PF) 100 MCG/2ML IJ SOLN
INTRAMUSCULAR | Status: DC | PRN
  Administered 2016-06-10: 25 ug via INTRAVENOUS
  Administered 2016-06-10: 50 ug via INTRAVENOUS
  Administered 2016-06-10: 100 ug via INTRAVENOUS
  Administered 2016-06-10: 25 ug via INTRAVENOUS
  Administered 2016-06-10: 150 ug via INTRAVENOUS

## 2016-06-10 MED ORDER — ONDANSETRON HCL 4 MG/2ML IJ SOLN
INTRAMUSCULAR | Status: DC | PRN
  Administered 2016-06-10: 4 mg via INTRAVENOUS

## 2016-06-10 SURGICAL SUPPLY — 60 items
BLADE CLIPPER SURG (BLADE) IMPLANT
CANISTER SUCT 3000ML PPV (MISCELLANEOUS) ×3 IMPLANT
CHLORAPREP W/TINT 26ML (MISCELLANEOUS) ×3 IMPLANT
COVER MAYO STAND STRL (DRAPES) ×2 IMPLANT
COVER SURGICAL LIGHT HANDLE (MISCELLANEOUS) ×3 IMPLANT
DRAIN CHANNEL 19F RND (DRAIN) ×2 IMPLANT
DRAPE HALF SHEET 40X57 (DRAPES) ×6 IMPLANT
DRAPE LAPAROSCOPIC ABDOMINAL (DRAPES) ×3 IMPLANT
DRAPE UTILITY XL STRL (DRAPES) ×7 IMPLANT
DRAPE WARM FLUID 44X44 (DRAPE) ×3 IMPLANT
DRSG OPSITE POSTOP 4X10 (GAUZE/BANDAGES/DRESSINGS) ×4 IMPLANT
DRSG OPSITE POSTOP 4X8 (GAUZE/BANDAGES/DRESSINGS) ×2 IMPLANT
ELECT BLADE 6.5 EXT (BLADE) ×3 IMPLANT
ELECT CAUTERY BLADE 6.4 (BLADE) ×4 IMPLANT
ELECT REM PT RETURN 9FT ADLT (ELECTROSURGICAL) ×3
ELECTRODE REM PT RTRN 9FT ADLT (ELECTROSURGICAL) ×1 IMPLANT
EVACUATOR SILICONE 100CC (DRAIN) ×2 IMPLANT
GLOVE BIO SURGEON STRL SZ8 (GLOVE) ×6 IMPLANT
GLOVE BIOGEL PI IND STRL 8 (GLOVE) ×2 IMPLANT
GLOVE BIOGEL PI INDICATOR 8 (GLOVE) ×4
GOWN STRL REUS W/ TWL LRG LVL3 (GOWN DISPOSABLE) ×4 IMPLANT
GOWN STRL REUS W/ TWL XL LVL3 (GOWN DISPOSABLE) ×2 IMPLANT
GOWN STRL REUS W/TWL LRG LVL3 (GOWN DISPOSABLE) ×3
GOWN STRL REUS W/TWL XL LVL3 (GOWN DISPOSABLE) ×6
HEMOSTAT SURGICEL 2X14 (HEMOSTASIS) ×2 IMPLANT
KIT BASIN OR (CUSTOM PROCEDURE TRAY) ×3 IMPLANT
KIT OSTOMY DRAINABLE 2.75 STR (WOUND CARE) ×2 IMPLANT
KIT ROOM TURNOVER OR (KITS) ×3 IMPLANT
LEGGING LITHOTOMY PAIR STRL (DRAPES) ×3 IMPLANT
LIGASURE IMPACT 36 18CM CVD LR (INSTRUMENTS) ×3 IMPLANT
NS IRRIG 1000ML POUR BTL (IV SOLUTION) ×6 IMPLANT
PACK GENERAL/GYN (CUSTOM PROCEDURE TRAY) ×3 IMPLANT
PAD ARMBOARD 7.5X6 YLW CONV (MISCELLANEOUS) ×6 IMPLANT
PENCIL BUTTON HOLSTER BLD 10FT (ELECTRODE) ×3 IMPLANT
SEALANT PATCH FIBRIN 2X4IN (MISCELLANEOUS) ×4 IMPLANT
SPECIMEN JAR X LARGE (MISCELLANEOUS) ×3 IMPLANT
SPONGE LAP 18X18 X RAY DECT (DISPOSABLE) ×6 IMPLANT
STAPLER CUT CVD 40MM GREEN (STAPLE) ×2 IMPLANT
STAPLER CUT RELOAD GREEN (STAPLE) ×2 IMPLANT
STAPLER VISISTAT 35W (STAPLE) ×5 IMPLANT
SUCTION POOLE TIP (SUCTIONS) ×3 IMPLANT
SURGILUBE 2OZ TUBE FLIPTOP (MISCELLANEOUS) IMPLANT
SUT ETHILON 2 0 FS 18 (SUTURE) ×2 IMPLANT
SUT PDS AB 1 TP1 96 (SUTURE) ×6 IMPLANT
SUT PROLENE 2 0 CT2 30 (SUTURE) IMPLANT
SUT PROLENE 2 0 KS (SUTURE) IMPLANT
SUT SILK 2 0 SH CR/8 (SUTURE) ×3 IMPLANT
SUT SILK 2 0 TIES 10X30 (SUTURE) ×3 IMPLANT
SUT SILK 3 0 SH CR/8 (SUTURE) ×3 IMPLANT
SUT SILK 3 0 TIES 10X30 (SUTURE) ×3 IMPLANT
SUT VIC AB 3-0 SH 18 (SUTURE) IMPLANT
SYR BULB IRRIGATION 50ML (SYRINGE) ×3 IMPLANT
TOWEL OR 17X26 10 PK STRL BLUE (TOWEL DISPOSABLE) ×6 IMPLANT
TRAY FOLEY W/METER SILVER 14FR (SET/KITS/TRAYS/PACK) ×3 IMPLANT
TRAY PROCTOSCOPIC FIBER OPTIC (SET/KITS/TRAYS/PACK) IMPLANT
TUBE CONNECTING 12'X1/4 (SUCTIONS) ×1
TUBE CONNECTING 12X1/4 (SUCTIONS) ×2 IMPLANT
UNDERPAD 30X30 (UNDERPADS AND DIAPERS) IMPLANT
WATER STERILE IRR 1000ML POUR (IV SOLUTION) ×3 IMPLANT
YANKAUER SUCT BULB TIP NO VENT (SUCTIONS) ×3 IMPLANT

## 2016-06-10 NOTE — Transfer of Care (Signed)
Immediate Anesthesia Transfer of Care Note  Patient: [name] [name]  Procedure(s) Performed: Procedure(s): OPEN SIGMOID COLECTOMY WITH DRAINAGE OF ABDOMINAL ABSCESS AND MOBILIZATION OF SPLENIC FLEXURE (N/A) COLOSTOMY WITH HARTMANN PROCEDURE (N/A)  Patient Location: PACU  Anesthesia Type:General  Level of Consciousness: awake, alert  and oriented  Airway & Oxygen Therapy: Patient Spontanous Breathing and Patient connected to face mask oxygen  Post-op Assessment: Report given to RN and Post -op Vital signs reviewed and stable  Post vital signs: Reviewed and stable  Last Vitals:  Vitals:   06/10/16 0000 06/10/16 0557  BP:  112/76  Pulse: 89 74  Resp:  18  Temp:  37.1 C    Last Pain:  Vitals:   06/10/16 1052  TempSrc:   PainSc: 9       Patients Stated Pain Goal: 4 (36/12/24 4975)  Complications: No apparent anesthesia complications

## 2016-06-10 NOTE — Progress Notes (Signed)
Day of Surgery  Subjective: CC in pre-op  Objective: Vital signs in last 24 hours: Temp:  [98 F (36.7 C)-99.1 F (37.3 C)] 98.7 F (37.1 C) (04/18 0557) Pulse Rate:  [65-103] 74 (04/18 0557) Resp:  [16-22] 18 (04/18 0557) BP: (109-112)/(70-76) 112/76 (04/18 0557) SpO2:  [90 %-100 %] 97 % (04/18 0557) Last BM Date: 06/07/16  Intake/Output from previous day: 04/17 0701 - 04/18 0700 In: 2970.4 [I.V.:2920.4; IV Piggyback:50] Out: 400 [Urine:400] Intake/Output this shift: No intake/output data recorded.  General appearance: appears stated age Resp: clear to auscultation bilaterally Cardio: regular rate and rhythm GI: tender LLQ Extremities: PAS being applied  Lab Results:   Recent Labs  06/09/16 0445 06/10/16 0614  WBC 13.2* 10.9*  HGB 11.7* 11.3*  HCT 36.7 35.0*  PLT 184 200   BMET  Recent Labs  06/09/16 0445 06/10/16 0614  NA 136 135  K 4.0 4.1  CL 101 102  CO2 27 26  GLUCOSE 121* 128*  BUN 18 16  CREATININE 1.66* 1.14*  CALCIUM 8.6* 8.7*   PT/INR  Recent Labs  06/08/16 0410  LABPROT 14.2  INR 1.09   ABG No results for input(s): PHART, HCO3 in the last 72 hours.  Invalid input(s): PCO2, PO2  Studies/Results: No results found.  Anti-infectives: Anti-infectives    Start     Dose/Rate Route Frequency Ordered Stop   06/09/16 1000  [MAR Hold]  ertapenem (INVANZ) 1 g in sodium chloride 0.9 % 50 mL IVPB     (MAR Hold since 06/10/16 1105)   1 g 100 mL/hr over 30 Minutes Intravenous Every 24 hours 06/09/16 0934     06/08/16 0600  piperacillin-tazobactam (ZOSYN) IVPB 3.375 g  Status:  Discontinued     3.375 g 12.5 mL/hr over 240 Minutes Intravenous Every 8 hours 06/08/16 0228 06/09/16 0934   06/07/16 2115  piperacillin-tazobactam (ZOSYN) IVPB 3.375 g     3.375 g 100 mL/hr over 30 Minutes Intravenous  Once 06/07/16 2108 06/07/16 2200      Assessment/Plan: Recurrent severe sigmoid diverticulitis - no improvement despite bowel rest and broad  spectrum ABX. Mild associated AKI improved a bit. Will proceed with sigmoid colectomy/colostomy. Procedure, risks, and benefits D/W her and she agrees.  LOS: 3 days    Royetta Probus E 06/10/2016

## 2016-06-10 NOTE — Op Note (Signed)
06/07/2016 - 06/10/2016  2:58 PM  PATIENT:  [name] [name]  56 y.o. female  PRE-OPERATIVE DIAGNOSIS:  ACUTE SIGMOID DIVERTICULITIS  POST-OPERATIVE DIAGNOSIS:  ACUTE SIGMOID DIVERTICULITIS WITH ABSCESS  PROCEDURE:  Procedure(s): OPEN SIGMOID COLECTOMY DRAINAGE OF INTRA-ABDOMINAL ABSCESS MOBILIZATION OF SPLENIC FLEXURE COLOSTOMY WITH HARTMANN PROCEDURE  SURGEON:  Georganna Skeans, M.D.  ASSISTANTS: [name], M.D.   ANESTHESIA:   general  EBL:  Total I/O In: 2250 [I.V.:2000; IV Piggyback:250] Out: 375 [Urine:225; Blood:150]  BLOOD ADMINISTERED:none  DRAINS: (1) Jackson-Pratt drain(s) with closed bulb suction in the LLQ   SPECIMEN:  Excision  DISPOSITION OF SPECIMEN:  PATHOLOGY  COUNTS:  YES  DICTATION: .Dragon Dictation Findings: Severe sigmoid diverticulitis with perforation and abscess  To: [name] [name] presents for sigmoid colectomy and colostomy. She was identified in the preop holding area. Informed consent was obtained. She is on IV antibiotics. She was brought to the operating room and general endotracheal anesthesia was administered by the anesthesia staff. Her abdomen was prepped and draped in a sterile fashion. Foley catheter was placed by nursing. Time out procedure was performed. Midline incision was made. Subcutaneous tissues were dissected down revealing the anterior fascia. It was divided along the midline and the peritoneal cavity was entered. The fascia was opened the length of the incision. We placed a Bookwalter retractor. Exploration revealed severe inflammation of the proximal sigmoid colon with an abscess in the left lower quadrant. This was drained. Sigmoid colon was mobilized from lateral peritoneal attachments along the sidewall. This mobilization continued down gently into the pelvis. The mid sigmoid: Was soft. We divided it there with a contour stapler. I then, carefully and along the wall of the colon, took the mesentery of the diseased portion of  the sigmoid using a LigaSure. We got proximal to the diseased segment and it was divided with another firing of the contour stapler. The was sent to pathology. Hemostasis was obtained on the mesentery with an additional stitch of 2-0 silk suture. The area was irrigated out and we ensured there was hemostasis. A 2-0 Prolene was placed on the distal sigmoid stump for later identification. Next we mobilized the descending colon from the lateral peritoneal attachments. It quickly became clear due to the patient's morbid obesity we would not have enough: To safely bring out a colostomy. We then mobilized the splenic flexure carefully. Her splenic flexure was high and this was difficult. We were able to get it down with careful dissection using the LigaSure and Bovie. There was a little bit of leading from the under side of the lower portion of the spleen. We placed some hemostatic Everest in the region. Next we made a circular incision in the left upper quadrant. A core tissue was removed and a cruciate incision was made in the fascia. The end of the colon was passed out through the former colostomy. Past nicely. There was not significant tension. The colostomy was then reduced back into the abdomen so we could recheck the spleen. We replaced the Everest in place a piece of Surgicel in there appeared to be good hemostasis at this time. The colostomy was brought back out through the hole in the fascia. Mesentery was checked. There is no other further bleeding. The abdomen was copiously irrigated with 3 L of warm saline. Bowel was returned to anatomic position. Spleen was rechecked and was dry. JP drain was placed in the left lower quadrant down with a previous abscess was and secured to skin with nylon. Fascia was  closed with #1 looped PDS from each end and tied in the middle. Subcutaneous tissues were copiously irrigated and then we closed the skin with staples. The colostomy was matured with 3-0 Vicryl's and was viable.  All counts were correct. Sterile dressings were applied. She tolerated procedure well without apparent complications and she was taken recovery in stable condition. PATIENT DISPOSITION:  PACU - hemodynamically stable.   Delay start of Pharmacological VTE agent (>24hrs) due to surgical blood loss or risk of bleeding:  no  Georganna Skeans, MD, MPH, FACS Pager: [phone]  4/18/20182:58 PM

## 2016-06-10 NOTE — Anesthesia Postprocedure Evaluation (Signed)
Anesthesia Post Note  Patient: [name] [name]  Procedure(s) Performed: Procedure(s) (LRB): OPEN SIGMOID COLECTOMY WITH DRAINAGE OF ABDOMINAL ABSCESS AND MOBILIZATION OF SPLENIC FLEXURE (N/A) COLOSTOMY WITH HARTMANN PROCEDURE (N/A)  Patient location during evaluation: PACU Anesthesia Type: General Level of consciousness: sedated and patient cooperative Pain management: pain level controlled Vital Signs Assessment: post-procedure vital signs reviewed and stable Respiratory status: spontaneous breathing Cardiovascular status: stable Anesthetic complications: no Comments: RN gave pt dilaudid by cabinet override and called to request verbal order. Pt was not complaining of itching and was satisfied with her improvement with pain control. I checked on the patient and told her she received dilaudid and she emphasized that it only made her itch.       Last Vitals:  Vitals:   06/10/16 1650 06/10/16 1705  BP: 140/87 (!) 143/89  Pulse: 86 83  Resp: (!) 23 (!) 27  Temp:      Last Pain:  Vitals:   06/10/16 1706  TempSrc:   PainSc: [place]

## 2016-06-10 NOTE — Anesthesia Preprocedure Evaluation (Signed)
Anesthesia Evaluation  Patient identified by MRN, date of birth, ID band Patient awake    Reviewed: Allergy & Precautions, H&P , NPO status , Patient's Chart, lab work & pertinent test results, reviewed documented beta blocker date and time   Airway Mallampati: II  TM Distance: >3 FB Neck ROM: Full    Dental  (+) Teeth Intact, Dental Advisory Given   Pulmonary neg pulmonary ROS,    Pulmonary exam normal breath sounds clear to auscultation       Cardiovascular hypertension, Pt. on medications and Pt. on home beta blockers Normal cardiovascular exam Rhythm:Regular Rate:Normal     Neuro/Psych PSYCHIATRIC DISORDERS Depression negative neurological ROS     GI/Hepatic Neg liver ROS, GERD  Medicated and Controlled,  Endo/Other  Morbid obesity  Renal/GU Renal disease     Musculoskeletal   Abdominal (+) + obese,   Peds  Hematology negative hematology ROS (+) anemia ,   Anesthesia Other Findings   Reproductive/Obstetrics                             Anesthesia Physical  Anesthesia Plan  ASA: III  Anesthesia Plan: General   Post-op Pain Management:    Induction: Intravenous  Airway Management Planned: Oral ETT  Additional Equipment:   Intra-op Plan:   Post-operative Plan: Extubation in OR  Informed Consent: I have reviewed the patients History and Physical, chart, labs and discussed the procedure including the risks, benefits and alternatives for the proposed anesthesia with the patient or authorized representative who has indicated his/her understanding and acceptance.   Dental advisory given  Plan Discussed with: CRNA and Surgeon  Anesthesia Plan Comments:         Anesthesia Quick Evaluation

## 2016-06-10 NOTE — Anesthesia Procedure Notes (Signed)
Procedure Name: Intubation Date/Time: 06/10/2016 12:36 PM Performed by: [name] [name] Pre-anesthesia Checklist: Patient identified, Emergency Drugs available, Suction available and Patient being monitored Patient Re-evaluated:Patient Re-evaluated prior to inductionOxygen Delivery Method: Circle System Utilized Preoxygenation: Pre-oxygenation with 100% oxygen Intubation Type: IV induction and Rapid sequence Ventilation: Mask ventilation without difficulty Laryngoscope Size: Mac and 4 Grade View: Grade I Tube type: Oral Tube size: 7.0 mm Number of attempts: 1 Airway Equipment and Method: Stylet Placement Confirmation: ETT inserted through vocal cords under direct vision,  positive ETCO2 and breath sounds checked- equal and bilateral Secured at: 22 cm Tube secured with: Tape Dental Injury: Teeth and Oropharynx as per pre-operative assessment

## 2016-06-11 ENCOUNTER — Encounter (HOSPITAL_COMMUNITY): Payer: Self-pay | Admitting: General Surgery

## 2016-06-11 LAB — CBC
HCT: 33.4 % — ABNORMAL LOW (ref 36.0–46.0)
HEMOGLOBIN: 10.5 g/dL — AB (ref 12.0–15.0)
MCH: 28.7 pg (ref 26.0–34.0)
MCHC: 31.4 g/dL (ref 30.0–36.0)
MCV: 91.3 fL (ref 78.0–100.0)
Platelets: 211 10*3/uL (ref 150–400)
RBC: 3.66 MIL/uL — ABNORMAL LOW (ref 3.87–5.11)
RDW: 14.7 % (ref 11.5–15.5)
WBC: 11.7 10*3/uL — ABNORMAL HIGH (ref 4.0–10.5)

## 2016-06-11 LAB — BASIC METABOLIC PANEL
Anion gap: 9 (ref 5–15)
BUN: 15 mg/dL (ref 6–20)
CHLORIDE: 101 mmol/L (ref 101–111)
CO2: 27 mmol/L (ref 22–32)
CREATININE: 0.87 mg/dL (ref 0.44–1.00)
Calcium: 8.7 mg/dL — ABNORMAL LOW (ref 8.9–10.3)
GFR calc Af Amer: >60 mL/min (ref >60)
GFR calc non Af Amer: >60 mL/min (ref >60)
GLUCOSE: 125 mg/dL — AB (ref 65–99)
Potassium: 4.1 mmol/L (ref 3.5–5.1)
Sodium: 137 mmol/L (ref 135–145)

## 2016-06-11 MED ORDER — ACETAMINOPHEN 500 MG PO TABS
1000.0000 mg | ORAL_TABLET | Freq: Three times a day (TID) | ORAL | Status: DC
Start: 2016-06-11 — End: 2016-06-17
  Administered 2016-06-11 – 2016-06-17 (×19): 1000 mg via ORAL
  Filled 2016-06-11 (×20): qty 2

## 2016-06-11 MED ORDER — METHOCARBAMOL 750 MG PO TABS: 750.0000 mg | ORAL_TABLET | Freq: Four times a day (QID) | ORAL | Status: DC | PRN

## 2016-06-11 NOTE — Consult Note (Signed)
[facility] Nurse ostomy consult note Stoma type/location: LLQ, end colostomy Stomal assessment/size: pouch intact with no output yet from surgery, only able to assess through pouch, pink, moist, appears only slightly budded. Peristomal assessment: NA Treatment options for stomal/peristomal skin: NA Output none Ostomy pouching: 2 pc.from surgery  Education provided: explained role of [place] and creation of stoma.  Patient sleepy. Will leave educational materials in the room and reassess patient tomorrow.  Enrolled patient in [place] program: No   WOC Nurse will follow along with you for continued support with ostomy teaching and care Makhya Arave [facility] MSN, RN, [place], [place]

## 2016-06-11 NOTE — Care Management Note (Addendum)
Case Management Note  Patient Details  Name: [name] [name] MRN: [id number] Date of Birth: 03/03/1960  Subjective/Objective:   Pt admitted on 06/07/16 with acute sigmoid diverticulitis; required sigmoid colectomy/colostomy on 06/10/16.  PTA, pt independent, lives with spouse.                   Action/Plan: Will follow for home needs as pt progresses; may need HHRN for continue ostomy teaching and care at dc.  4/20 [name] Collet RN CM Addendum Spoke with patient and husband in room. They were provided with list for [facility] providers. Patient will need [facility] RN for colostomy needs.     Expected Discharge Date:                  Expected Discharge Plan:  [place]  In-House Referral:     Discharge planning Services  CM Consult  Post Acute Care Choice:    Choice offered to:     DME Arranged:    DME Agency:     HH Arranged:    [place] Agency:     Status of Service:  In process, will continue to follow  If discussed at Long Length of Stay Meetings, dates discussed:    Additional Comments:  Reinaldo Raddle, RN, BSN  Trauma/Neuro ICU Case Manager [phone]

## 2016-06-11 NOTE — Progress Notes (Signed)
[facility] Progress Note  1 Day Post-Op  Subjective: CC: post-operative abdominal pain Reports abdominal pain improved with PCA but becomes intolerable after 1 hour. Belching. Denies colostomy output. Urinating after foley removal. Not ambulating. Reports pulling 600cc on IS.   Objective: Vital signs in last 24 hours: Temp:  [97 F (36.1 C)-98.6 F (37 C)] 98.2 F (36.8 C) (04/19 0526) Pulse Rate:  [68-94] 68 (04/19 0526) Resp:  [14-33] 19 (04/19 0744) BP: (131-147)/(74-89) 145/80 (04/19 0526) SpO2:  [90 %-98 %] 95 % (04/19 0744) Last BM Date: 06/07/16  Intake/Output from previous day: 04/18 0701 - 04/19 0700 In: 5994.8 [I.V.:5694.8; IV Piggyback:300] Out: 2951 [Urine:1125; Drains:177; Blood:150] Intake/Output this shift: No intake/output data recorded.  PE: PE: Gen: Alert, cooperative Card: Regular rate and rhythm, pedal pulses 2+ Pulm: Non-labored, clear to auscultation bilaterally  Abd: Soft, appropriately tender, midline incision w/ honeycomb in place - dried sanguinous drainage on medial and distal aspect of incision, colostomy pouch with good seal, stoma viable and edematous. No flatus/stool in pouch, just condensation.   LLQ JP drain: 177cc/24 h sanguinous Ext: No erythema, edema, or tenderness  Psych: A&Ox3  Lab Results:   Recent Labs  06/10/16 2021 06/11/16 0356  WBC 13.7* 11.7*  HGB 11.1* 10.5*  HCT 34.7* 33.4*  PLT 212 211   BMET  Recent Labs  06/10/16 0614 06/11/16 0356  NA 135 137  K 4.1 4.1  CL 102 101  CO2 26 27  GLUCOSE 128* 125*  BUN 16 15  CREATININE 1.14* 0.87  CALCIUM 8.7* 8.7*   PT/INR No results for input(s): LABPROT, INR in the last 72 hours. CMP     Component Value Date/Time   NA 137 06/11/2016 0356   K 4.1 06/11/2016 0356   CL 101 06/11/2016 0356   CO2 27 06/11/2016 0356   GLUCOSE 125 (H) 06/11/2016 0356   BUN 15 06/11/2016 0356   CREATININE 0.87 06/11/2016 0356   CALCIUM 8.7 (L) 06/11/2016 0356   PROT 7.7 06/07/2016 1845   ALBUMIN 4.2 06/07/2016 1845   AST 34 06/07/2016 1845   ALT 32 06/07/2016 1845   ALKPHOS 76 06/07/2016 1845   BILITOT 0.6 06/07/2016 1845   GFRNONAA >60 06/11/2016 0356   GFRAA >60 06/11/2016 0356   Lipase     Component Value Date/Time   LIPASE 20 06/07/2016 1845       Studies/Results: No results found.  Anti-infectives: Anti-infectives    Start     Dose/Rate Route Frequency Ordered Stop   06/09/16 1000  ertapenem (INVANZ) 1 g in sodium chloride 0.9 % 50 mL IVPB     1 g 100 mL/hr over 30 Minutes Intravenous Every 24 hours 06/09/16 0934     06/08/16 0600  piperacillin-tazobactam (ZOSYN) IVPB 3.375 g  Status:  Discontinued     3.375 g 12.5 mL/hr over 240 Minutes Intravenous Every 8 hours 06/08/16 0228 06/09/16 0934   06/07/16 2115  piperacillin-tazobactam (ZOSYN) IVPB 3.375 g     3.375 g 100 mL/hr over 30 Minutes Intravenous  Once 06/07/16 2108 06/07/16 2200     Assessment/Plan Acute sigmoid diverticulitis w/ adjacent extraluminal air S/P OPEN SIGMOID COLECTOMY, DRAINAGE OF INTRA-ABDOMINAL ABSCESS, MOBILIZATION OF SPLENIC FLEXURE, COLOSTOMY WITH HARTMANN PROCEDURE, 06/11/16 Dr. Georganna Skeans  - await bowel function - pain control: scheduled tylenol, full dose fentanyl PCA, Robaxin PRN - IS/pulm toilet - mobilize as tolerated   Leukocytosis- 2/2 above, 11.7, improving  HTN - home meds, hydralazine PRN CKD 1 w/  AKI- Resolved with d/c nephrotoxic agents and IVF; Creatinine 0.87  Depression- Zoloft  OA  FEN - clear liquids, IVF  ID: Zosyn 4/15-4/17, [name] 4/17 >>  VTE: SCD's, Lovenox  Plan : Add scheduled tylenol and increase robaxin dosage for better pain control continue IV abx today, await bowel function AM labs    LOS: 4 days    Jill Alexanders , [facility] Surgery 06/11/2016, 7:50 AM Pager: [phone] Consults: [phone] Mon-Fri 7:00 am-4:30 pm Sat-Sun 7:00 am-11:30 am

## 2016-06-12 LAB — CBC
HCT: 31.8 % — ABNORMAL LOW (ref 36.0–46.0)
Hemoglobin: 10.1 g/dL — ABNORMAL LOW (ref 12.0–15.0)
MCH: 28.9 pg (ref 26.0–34.0)
MCHC: 31.8 g/dL (ref 30.0–36.0)
MCV: 91.1 fL (ref 78.0–100.0)
PLATELETS: 231 10*3/uL (ref 150–400)
RBC: 3.49 MIL/uL — AB (ref 3.87–5.11)
RDW: 14.7 % (ref 11.5–15.5)
WBC: 11.8 10*3/uL — AB (ref 4.0–10.5)

## 2016-06-12 LAB — BASIC METABOLIC PANEL
ANION GAP: 13 (ref 5–15)
BUN: 12 mg/dL (ref 6–20)
CO2: 27 mmol/L (ref 22–32)
Calcium: 8.8 mg/dL — ABNORMAL LOW (ref 8.9–10.3)
Chloride: 98 mmol/L — ABNORMAL LOW (ref 101–111)
Creatinine, Ser: 0.81 mg/dL (ref 0.44–1.00)
GFR calc Af Amer: >60 mL/min (ref >60)
Glucose, Bld: 94 mg/dL (ref 65–99)
POTASSIUM: 4.2 mmol/L (ref 3.5–5.1)
SODIUM: 138 mmol/L (ref 135–145)

## 2016-06-12 MED ORDER — TRAMADOL HCL 50 MG PO TABS
50.0000 mg | ORAL_TABLET | Freq: Four times a day (QID) | ORAL | Status: DC
  Administered 2016-06-12 – 2016-06-17 (×19): 50 mg via ORAL
  Filled 2016-06-12 (×21): qty 1

## 2016-06-12 NOTE — Progress Notes (Signed)
0 mcg  Of fentanyl wasted, fentanyl syringe switched out

## 2016-06-12 NOTE — Consult Note (Signed)
[place] Nurse ostomy follow up Stoma type/location: LLQ, end colostomy Stomal assessment/size: 1 1/4" x 2 1/2" oval shaped, flush stoma in a dip in the abdominal contour Peristomal assessment: intact  Treatment options for stomal/peristomal skin: added 2" skin barrier to flatted dip in abdomen Output scant bloody, +flatus Ostomy pouching: 1pc with 2" barrier ring today.  Not sure when patient mobilizes that this will work for her but I feel sure 2pc will not be flexible enough.  I placed 1pc flexible convex in her room because I am fearful that when she sits up this stoma will be in a crease and be more challenging to manage.    Education provided:  Scientist, clinical (histocompatibility and immunogenetics) left in the room, [employer].  She is only slightly engaged to learn and it is difficult for her to see her stoma due to the size of her abdomen and location of the stoma.  Her husband is at the bedside but he did not get out of the chair, we discussed he would be her support person at home but I am not sure how much he will help. While I was teaching the both about the pouch change and care, he fell asleep.  Will visit with patient on Monday, hopefully she will have mobilized more and I can assess the abdominal contours with her sitting up more.  Even to raise the [facility] more than 45 degrees made her uncomfortable.  Enrolled patient in [place] Start Discharge program: No  WOC Nurse will follow along with you for continued support with ostomy teaching and care [facility] MSN, RN, [place], [place]

## 2016-06-12 NOTE — Evaluation (Signed)
Physical Therapy Evaluation Patient Details Name: [name] [name] MRN: [id number] DOB: 02-Feb-1961 Today's Date: 06/12/2016   History of Present Illness  56 yo admitted with acute diverticulitis s/p colectomy and colostomy 4/18. PMhx: anxiety, GERD, HLD, HTN, obesity  Clinical Impression  Pt willing to get OOB although slightly apprehensive. 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 Pt with decreased mobility, transfers, gait, activity tolerance and incisional pain who will benefit from acute therapy to maximize mobility, function and gait to decrease burden of care. Pt encouraged to be OOB daily with nursing staff and increase activity to promote return home. Pt states she has RW, cane and BSC at home.   sats 93% on 4L , HR 68    Follow Up Recommendations Home health PT    Equipment Recommendations  None recommended by PT    Recommendations for Other Services       Precautions / Restrictions Precautions Precautions: Fall Precaution Comments: ostomy Restrictions Weight Bearing Restrictions: No      Mobility  Bed Mobility Overal bed mobility: Needs Assistance Bed Mobility: Rolling;Sidelying to Sit Rolling: Min assist Sidelying to sit: Min assist       General bed mobility comments: cues for sequence with increased time and assist to elevate trunk from surface  Transfers Overall transfer level: Needs assistance   Transfers: Sit to/from Stand;Stand Pivot Transfers Sit to Stand: Min assist Stand pivot transfers: Min assist       General transfer comment: cues for hand placement and safety  Ambulation/Gait Ambulation/Gait assistance: Min assist Ambulation Distance (Feet): 12 Feet Assistive device: Rolling walker (2 wheeled) Gait Pattern/deviations: Step-through pattern;Decreased stride length;Trunk flexed   Gait velocity interpretation: Below normal speed for age/gender General Gait Details: cues for posture, position  in RW and encouragement to increase ambulation with reliance on RW for stability  Stairs            Wheelchair Mobility    Modified Rankin (Stroke Patients Only)       Balance Overall balance assessment: No apparent balance deficits (not formally assessed)                                           Pertinent Vitals/Pain Pain Assessment: 0-10 Pain Score: 4  Pain Location: abdomen Pain Descriptors / Indicators: Sore;Operative site guarding Pain Intervention(s): Limited activity within patient's tolerance;Repositioned;PCA encouraged;Monitored during session    Home Living Family/patient expects to be discharged to:: Private residence Living Arrangements: Spouse/significant other Available Help at Discharge: Family;Available PRN/intermittently Type of Home: House Home Access: Stairs to enter Entrance Stairs-Rails: None Entrance Stairs-Number of Steps: 1 Home Layout: One level Home Equipment: Walker - 2 wheels;Cane - single point;Bedside commode      Prior Function Level of Independence: Independent               Hand Dominance        Extremity/Trunk Assessment   Upper Extremity Assessment Upper Extremity Assessment: Overall WFL for tasks assessed    Lower Extremity Assessment Lower Extremity Assessment: Overall WFL for tasks assessed    Cervical / Trunk Assessment Cervical / Trunk Assessment: Kyphotic (flexed trunk with abdominal guarding)  Communication   Communication: No difficulties  Cognition Arousal/Alertness: Awake/alert Behavior During Therapy: WFL for tasks assessed/performed Overall Cognitive Status: Within Functional Limits for tasks assessed  General Comments      Exercises     Assessment/Plan    PT Assessment Patient needs continued PT services  PT Problem List Decreased mobility;Decreased activity tolerance;Decreased knowledge of use of DME;Pain;Decreased  skin integrity;Cardiopulmonary status limiting activity;Obesity       PT Treatment Interventions Functional mobility training;DME instruction;Gait training;Stair training;Therapeutic exercise;Patient/family education;Therapeutic activities    PT Goals (Current goals can be found in the Care Plan section)  Acute Rehab PT Goals Patient Stated Goal: return to home and beach PT Goal Formulation: With patient/family Time For Goal Achievement: 06/26/16 Potential to Achieve Goals: Good    Frequency Min 3X/week   Barriers to discharge Decreased caregiver support spouse unsure how long he can be off work    Co-evaluation               End of Session Equipment Utilized During Treatment: Oxygen Activity Tolerance: Patient tolerated treatment well Patient left: in chair;with call bell/phone within reach;with family/visitor present;with chair alarm set Nurse Communication: Mobility status PT Visit Diagnosis: Difficulty in walking, not elsewhere classified (R26.2)    Time: 2111-7356 PT Time Calculation (min) (ACUTE ONLY): 29 min   Charges:   PT Evaluation $PT Eval Moderate Complexity: 1 Procedure PT Treatments $Therapeutic Activity: 8-22 mins   PT G Codes:        Elwyn Reach, PT [phone]  Danilynn Jemison B Thessaly Mccullers 06/12/2016, 1:35 PM

## 2016-06-12 NOTE — Progress Notes (Signed)
[place] [facility] Progress Note  2 Days Post-Op  Subjective: CC: abdominal pain  Pain rated as 6/10, worse with movement. Currently locked out of PCA. Did not use robaxin yesterday. Patient does report her pain is better than yesterday. Not mobilizing. Tolerating clears without emesis. Reports a small BM per rectum yesterday. Denies stool in colostomy pouch. Pulling >1,000 in IS.  Afebrile, VSS Objective: Vital signs in last 24 hours: Temp:  [97.9 F (36.6 C)-99.1 F (37.3 C)] 99.1 F (37.3 C) (04/20 0829) Pulse Rate:  [67-73] 70 (04/20 0829) Resp:  [18-24] 24 (04/20 0829) BP: (133-146)/(62-82) 136/72 (04/20 0829) SpO2:  [93 %-98 %] 94 % (04/20 0829) FiO2 (%):  [2 %] 2 % (04/19 2037) Last BM Date: 06/07/16 (w/ colostomy)  Intake/Output from previous day: 04/19 0701 - 04/20 0700 In: 1405 [P.O.:85; I.V.:1320] Out: 1223 [Urine:1125; Drains:98] Intake/Output this shift: No intake/output data recorded.  PE: PE: Gen: Alert, cooperative Card: Regular rate and rhythm, pedal pulses 2+ Pulm: Non-labored, clear to auscultation bilaterally  Abd: Soft, appropriately tender,bowel sounds present, midline incision w/ honeycomb in place - dried sanguinous drainage on medial and distal aspect of incision, colostomy pouch with good seal, stoma viable and edematous. Small amount of flatus in pouch along with SS drainage.             LLQ JP drain: 98cc/24 h SS Ext: No erythema, edema, or tenderness  Psych: A&Ox3   Lab Results:   Recent Labs  06/11/16 0356 06/12/16 0416  WBC 11.7* 11.8*  HGB 10.5* 10.1*  HCT 33.4* 31.8*  PLT 211 231   BMET  Recent Labs  06/11/16 0356 06/12/16 0416  NA 137 138  K 4.1 4.2  CL 101 98*  CO2 27 27  GLUCOSE 125* 94  BUN 15 12  CREATININE 0.87 0.81  CALCIUM 8.7* 8.8*   PT/INR No results for input(s): LABPROT, INR in the last 72 hours. CMP     Component Value Date/Time   NA 138 06/12/2016 0416   K 4.2 06/12/2016 0416   CL 98 (L)  06/12/2016 0416   CO2 27 06/12/2016 0416   GLUCOSE 94 06/12/2016 0416   BUN 12 06/12/2016 0416   CREATININE 0.81 06/12/2016 0416   CALCIUM 8.8 (L) 06/12/2016 0416   PROT 7.7 06/07/2016 1845   ALBUMIN 4.2 06/07/2016 1845   AST 34 06/07/2016 1845   ALT 32 06/07/2016 1845   ALKPHOS 76 06/07/2016 1845   BILITOT 0.6 06/07/2016 1845   GFRNONAA >60 06/12/2016 0416   GFRAA >60 06/12/2016 0416   Lipase     Component Value Date/Time   LIPASE 20 06/07/2016 1845       Studies/Results: No results found.  Anti-infectives: Anti-infectives    Start     Dose/Rate Route Frequency Ordered Stop   06/09/16 1000  ertapenem (INVANZ) 1 g in sodium chloride 0.9 % 50 mL IVPB     1 g 100 mL/hr over 30 Minutes Intravenous Every 24 hours 06/09/16 0934     06/08/16 0600  piperacillin-tazobactam (ZOSYN) IVPB 3.375 g  Status:  Discontinued     3.375 g 12.5 mL/hr over 240 Minutes Intravenous Every 8 hours 06/08/16 0228 06/09/16 0934   06/07/16 2115  piperacillin-tazobactam (ZOSYN) IVPB 3.375 g     3.375 g 100 mL/hr over 30 Minutes Intravenous  Once 06/07/16 2108 06/07/16 2200       Assessment/Plan Acute sigmoid diverticulitis w/ adjacent extraluminal air S/P OPEN SIGMOID COLECTOMY, DRAINAGE OF INTRA-ABDOMINAL ABSCESS, MOBILIZATION  OF SPLENIC FLEXURE, COLOSTOMY WITH HARTMANN PROCEDURE, 06/11/16 Dr. Georganna Skeans  - await bowel function; appreciate WOC assistance  - pain control: scheduled tylenol, full dose fentanyl PCA, Robaxin PRN - IS/pulm toilet - mobilize as tolerated   Leukocytosis- 2/2 above, 11.8, improving  HTN - home meds, hydralazine PRN CKD 1 w/ AKI- Resolved with d/c nephrotoxic agents and IVF; Creatinine 0.87  Depression- Zoloft  OA  FEN - clear liquids, IVF, electrolytes ID: Zosyn 4/15-4/17, [name] 4/17 >> VTE: SCD's, Lovenox  Plan : await further bowel function, continue IV abx, PT/OT  Will takedown honeycomb tomorrow and examine incision.   LOS: 5 days     Jill Alexanders , [facility] Surgery 06/12/2016, 9:01 AM Pager: [phone] Consults: [phone] Mon-Fri 7:00 am-4:30 pm Sat-Sun 7:00 am-11:30 am

## 2016-06-13 MED ORDER — ENSURE ENLIVE PO LIQD
237.0000 mL | Freq: Two times a day (BID) | ORAL | Status: DC
  Administered 2016-06-13 – 2016-06-16 (×7): 237 mL via ORAL

## 2016-06-13 NOTE — Evaluation (Signed)
Occupational Therapy Evaluation Patient Details Name: [name] [name] MRN: [id number] DOB: 1960-05-29 Today's Date: 06/13/2016    History of Present Illness 56 yo admitted with acute diverticulitis s/p colectomy and colostomy 4/18. PMhx: anxiety, GERD, HLD, HTN, obesity   Clinical Impression   Pt reports she was independent with ADL PTA. Currently pt overall min guard for functional mobility and min-max assist for ADL. Pt planning to d/c home with intermittent family supervision; husband works during the day. Recommending HHOT for follow up to maximize independence and safety with ADL and functional mobility upon return home; pending progress pt may not need OT follow up. Pt would benefit from continued skilled OT to address established goals.    Follow Up Recommendations  Home health OT;Supervision - Intermittent    Equipment Recommendations  Tub/shower bench;Other (comment) (Adaptive equipment)    Recommendations for Other Services       Precautions / Restrictions Precautions Precautions: Fall Precaution Comments: ostomy Restrictions Weight Bearing Restrictions: No      Mobility Bed Mobility Overal bed mobility: Needs Assistance Bed Mobility: Supine to Sit;Sit to Supine     Supine to sit: Min guard Sit to supine: Min guard   General bed mobility comments: for safety. HOB elevated with use of bed rail. Increased time  Transfers Overall transfer level: Needs assistance Equipment used: Rolling walker (2 wheeled) Transfers: Sit to/from Stand Sit to Stand: Min guard         General transfer comment: Cues for hand placement. Min guard for safety    Balance Overall balance assessment: No apparent balance deficits (not formally assessed)                                         ADL either performed or assessed with clinical judgement   ADL Overall ADL's : Needs assistance/impaired Eating/Feeding: Independent;Sitting   Grooming: Set  up;Supervision/safety;Sitting   Upper Body Bathing: Minimal assistance;Sitting   Lower Body Bathing: Maximal assistance;Sit to/from stand   Upper Body Dressing : Minimal assistance;Sitting   Lower Body Dressing: Maximal assistance;Sit to/from stand   Toilet Transfer: Min guard;Ambulation;BSC;RW Toilet Transfer Details (indicate cue type and reason): Simulated by sit to stand from EOB with functional mobility         Functional mobility during ADLs: Min guard;Rolling walker General ADL Comments: SpO2 >87% on RA throughout activity; on 3L in room upon arrival.     Vision         Perception     Praxis      Pertinent Vitals/Pain Pain Assessment: 0-10 Pain Score: 6  Pain Location: abdomen Pain Descriptors / Indicators: Discomfort;Sore Pain Intervention(s): Monitored during session;Limited activity within patient's tolerance     Hand Dominance     Extremity/Trunk Assessment Upper Extremity Assessment Upper Extremity Assessment: Overall WFL for tasks assessed   Lower Extremity Assessment Lower Extremity Assessment: Defer to PT evaluation   Cervical / Trunk Assessment Cervical / Trunk Assessment: Normal   Communication Communication Communication: No difficulties   Cognition Arousal/Alertness: Awake/alert Behavior During Therapy: WFL for tasks assessed/performed Overall Cognitive Status: Within Functional Limits for tasks assessed                                     General Comments       Exercises     Shoulder  Instructions      Home Living Family/patient expects to be discharged to:: Private residence Living Arrangements: Spouse/significant other Available Help at Discharge: Family;Available PRN/intermittently Type of Home: House Home Access: Stairs to enter [employer] of Steps: 1 Entrance Stairs-Rails: None Home Layout: One level     Bathroom Shower/Tub: Tub/shower unit;Curtain   Biochemist, clinical: Standard     Home  Equipment: Environmental consultant - 2 wheels;Cane - single point;Bedside commode          Prior Functioning/Environment Level of Independence: Independent        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        OT Problem List: Decreased strength;Decreased activity tolerance;Decreased knowledge of use of DME or AE;Obesity;Pain      OT Treatment/Interventions: Self-care/ADL training;Energy conservation;DME and/or AE instruction;Therapeutic activities;Patient/family education;Balance training    OT Goals(Current goals can be found in the care plan section) Acute Rehab OT Goals Patient Stated Goal: return to home and beach OT Goal Formulation: With patient/family Time For Goal Achievement: 06/27/16 Potential to Achieve Goals: Good ADL Goals Pt Will Perform Lower Body Bathing: with supervision;sit to/from stand (with or without AE) Pt Will Perform Lower Body Dressing: with supervision;sit to/from stand (with or without AE) Pt Will Transfer to Toilet: with supervision;ambulating;bedside commode Pt Will Perform Toileting - Clothing Manipulation and hygiene: with supervision;sit to/from stand Pt Will Perform Tub/Shower Transfer: with supervision;Tub transfer;ambulating;rolling walker (tub bench vs. 3 in 1)  OT Frequency: Min 2X/week   Barriers to D/C:            Co-evaluation              End of Session Equipment Utilized During Treatment: Surveyor, mining Communication: Mobility status  Activity Tolerance: Patient tolerated treatment well Patient left: in bed;with call bell/phone within reach;with family/visitor present  OT Visit Diagnosis: Unsteadiness on feet (R26.81);Muscle weakness (generalized) (M62.81);Pain                Time: 8315-1761 OT Time Calculation (min): 27 min Charges:  OT General Charges $OT Visit: 1 Procedure OT Evaluation $OT Eval Moderate Complexity: 1 Procedure OT Treatments $Self Care/Home Management : 8-22 mins G-Codes:     Mel Almond  A. Ulice Brilliant, M.S., OTR/L Pager: [employer] 06/13/2016, 4:38 PM

## 2016-06-13 NOTE — Progress Notes (Signed)
3 Days Post-Op  Subjective: oob some, no n/v tol clears, pain better controlled  Objective: Vital signs in last 24 hours: Temp:  [98 F (36.7 C)-99.1 F (37.3 C)] 98.4 F (36.9 C) (04/21 0501) Pulse Rate:  [58-70] 58 (04/21 0501) Resp:  [12-24] 19 (04/21 0501) BP: (136-152)/(72-80) 143/76 (04/21 0501) SpO2:  [93 %-100 %] 97 % (04/21 0501) Last BM Date: 06/07/16  Intake/Output from previous day: 04/20 0701 - 04/21 0700 In: 2551.7 [P.O.:60; I.V.:2391.7; IV Piggyback:100] Out: 3154 [Urine:1700; Drains:70] Intake/Output this shift: No intake/output data recorded.  Resp: clear, decreased bilateral bases Cardio: regular rate and rhythm GI: dressing intact, jp serous, stoma pink with air in bag  Lab Results:   Recent Labs  06/11/16 0356 06/12/16 0416  WBC 11.7* 11.8*  HGB 10.5* 10.1*  HCT 33.4* 31.8*  PLT 211 231   BMET  Recent Labs  06/11/16 0356 06/12/16 0416  NA 137 138  K 4.1 4.2  CL 101 98*  CO2 27 27  GLUCOSE 125* 94  BUN 15 12  CREATININE 0.87 0.81  CALCIUM 8.7* 8.8*   PT/INR No results for input(s): LABPROT, INR in the last 72 hours. ABG No results for input(s): PHART, HCO3 in the last 72 hours.  Invalid input(s): PCO2, PO2  Studies/Results: No results found.  Anti-infectives: Anti-infectives    Start     Dose/Rate Route Frequency Ordered Stop   06/09/16 1000  ertapenem (INVANZ) 1 g in sodium chloride 0.9 % 50 mL IVPB     1 g 100 mL/hr over 30 Minutes Intravenous Every 24 hours 06/09/16 0934     06/08/16 0600  piperacillin-tazobactam (ZOSYN) IVPB 3.375 g  Status:  Discontinued     3.375 g 12.5 mL/hr over 240 Minutes Intravenous Every 8 hours 06/08/16 0228 06/09/16 0934   06/07/16 2115  piperacillin-tazobactam (ZOSYN) IVPB 3.375 g     3.375 g 100 mL/hr over 30 Minutes Intravenous  Once 06/07/16 2108 06/07/16 2200      Assessment/Plan: Acute sigmoid diverticulitis w/ adjacent extraluminal air POD #2 S/P OPEN SIGMOID COLECTOMY, DRAINAGE OF  INTRA-ABDOMINAL ABSCESS, MOBILIZATION OF SPLENIC FLEXURE, COLOSTOMY WITH HARTMANN PROCEDURE, 06/11/16 Dr. Georganna Skeans  - advance to full liquids today - pain control: prn tylenol, full dose fentanyl PCA, Robaxin PRN, if tolerates orals today can switch to oral pain control in am - IS/pulm toilet, needs to be oob Leukocytosis-continue to follow, should receive 7 d abx postop HTN - home meds, hydralazine PRN CKD 1 w/ AKI- Resolved with d/c nephrotoxic agents and IVF; Creatinine 0.87, recheck in am as well as k due to ivg Depression- Zoloft  ID: Zosyn 4/15-4/17, [name] 4/17 >> VTE: SCD's, Lovenox      [facility] 06/13/2016

## 2016-06-14 LAB — CBC
HEMATOCRIT: 33.4 % — AB (ref 36.0–46.0)
Hemoglobin: 10.7 g/dL — ABNORMAL LOW (ref 12.0–15.0)
MCH: 29 pg (ref 26.0–34.0)
MCHC: 32 g/dL (ref 30.0–36.0)
MCV: 90.5 fL (ref 78.0–100.0)
Platelets: 288 10*3/uL (ref 150–400)
RBC: 3.69 MIL/uL — AB (ref 3.87–5.11)
RDW: 14.5 % (ref 11.5–15.5)
WBC: 11.7 10*3/uL — ABNORMAL HIGH (ref 4.0–10.5)

## 2016-06-14 LAB — BASIC METABOLIC PANEL
Anion gap: 10 (ref 5–15)
BUN: 11 mg/dL (ref 6–20)
CHLORIDE: 101 mmol/L (ref 101–111)
CO2: 27 mmol/L (ref 22–32)
CREATININE: 0.76 mg/dL (ref 0.44–1.00)
Calcium: 8.7 mg/dL — ABNORMAL LOW (ref 8.9–10.3)
GFR calc Af Amer: >60 mL/min (ref >60)
GFR calc non Af Amer: >60 mL/min (ref >60)
Glucose, Bld: 103 mg/dL — ABNORMAL HIGH (ref 65–99)
POTASSIUM: 4.2 mmol/L (ref 3.5–5.1)
Sodium: 138 mmol/L (ref 135–145)

## 2016-06-14 MED ORDER — FENTANYL CITRATE (PF) 100 MCG/2ML IJ SOLN
25.0000 ug | INTRAMUSCULAR | Status: DC | PRN
  Administered 2016-06-14: 25 ug via INTRAVENOUS
  Filled 2016-06-14: qty 2

## 2016-06-14 NOTE — Progress Notes (Signed)
4 Days Post-Op  Subjective: Out of bed, tolerating liquids. Pain well controlled, not using pca much  Objective: Vital signs in last 24 hours: Temp:  [97.4 F (36.3 C)-98.2 F (36.8 C)] 98.2 F (36.8 C) (04/22 0514) Pulse Rate:  [63-69] 63 (04/22 0514) Resp:  [17-21] 17 (04/22 0921) BP: (126-134)/(66-80) 134/77 (04/22 0514) SpO2:  [95 %-99 %] 95 % (04/22 0921) Last BM Date: 06/13/16 (Colostomy)  Intake/Output from previous day: 04/21 0701 - 04/22 0700 In: 2256.3 [I.V.:2206.3; IV Piggyback:50] Out: 905 [Urine:850; Drains:55] Intake/Output this shift: No intake/output data recorded.  Resp: clear, decreased bilateral bases Cardio: regular rate and rhythm GI: dressing intact, jp serous, stoma pink with air in bag and stool  Lab Results:   Recent Labs  06/12/16 0416 06/14/16 0537  WBC 11.8* 11.7*  HGB 10.1* 10.7*  HCT 31.8* 33.4*  PLT 231 288   BMET  Recent Labs  06/12/16 0416 06/14/16 0537  NA 138 138  K 4.2 4.2  CL 98* 101  CO2 27 27  GLUCOSE 94 103*  BUN 12 11  CREATININE 0.81 0.76  CALCIUM 8.8* 8.7*   PT/INR No results for input(s): LABPROT, INR in the last 72 hours. ABG No results for input(s): PHART, HCO3 in the last 72 hours.  Invalid input(s): PCO2, PO2  Studies/Results: No results found.  Anti-infectives: Anti-infectives    Start     Dose/Rate Route Frequency Ordered Stop   06/09/16 1000  ertapenem (INVANZ) 1 g in sodium chloride 0.9 % 50 mL IVPB     1 g 100 mL/hr over 30 Minutes Intravenous Every 24 hours 06/09/16 0934 06/16/16 2359   06/08/16 0600  piperacillin-tazobactam (ZOSYN) IVPB 3.375 g  Status:  Discontinued     3.375 g 12.5 mL/hr over 240 Minutes Intravenous Every 8 hours 06/08/16 0228 06/09/16 0934   06/07/16 2115  piperacillin-tazobactam (ZOSYN) IVPB 3.375 g     3.375 g 100 mL/hr over 30 Minutes Intravenous  Once 06/07/16 2108 06/07/16 2200      Assessment/Plan: Acute sigmoid diverticulitis w/ adjacent extraluminal  air POD #3 S/P OPEN SIGMOID COLECTOMY, DRAINAGE OF INTRA-ABDOMINAL ABSCESS, MOBILIZATION OF SPLENIC FLEXURE, COLOSTOMY WITH HARTMANN PROCEDURE, 06/11/16 Dr. Georganna Skeans  - advance to regular diet today - pain control: prn tylenol, intermittent fentanyl, Robaxin PRN, oxycodone (DC PCA) - IS/pulm toilet, needs to be oob/ ambulating Leukocytosis-continue to follow, should receive 7 d abx postop [name]) HTN - home meds, hydralazine PRN CKD 1 w/ AKI- Resolved with d/c nephrotoxic agents and IVF; Creatinine 0.87, recheck in am as well as k due to ivg Depression- Zoloft  ID: Zosyn 4/15-4/17, [name] 4/17 >> VTE: SCD's, Lovenox      [name] [name] 06/14/2016

## 2016-06-15 LAB — BASIC METABOLIC PANEL
ANION GAP: 9 (ref 5–15)
BUN: 12 mg/dL (ref 6–20)
CO2: 29 mmol/L (ref 22–32)
Calcium: 8.7 mg/dL — ABNORMAL LOW (ref 8.9–10.3)
Chloride: 98 mmol/L — ABNORMAL LOW (ref 101–111)
Creatinine, Ser: 0.88 mg/dL (ref 0.44–1.00)
GFR calc non Af Amer: >60 mL/min (ref >60)
Glucose, Bld: 93 mg/dL (ref 65–99)
Potassium: 4.3 mmol/L (ref 3.5–5.1)
SODIUM: 136 mmol/L (ref 135–145)

## 2016-06-15 LAB — CBC
HEMATOCRIT: 34.5 % — AB (ref 36.0–46.0)
Hemoglobin: 10.9 g/dL — ABNORMAL LOW (ref 12.0–15.0)
MCH: 28.4 pg (ref 26.0–34.0)
MCHC: 31.6 g/dL (ref 30.0–36.0)
MCV: 89.8 fL (ref 78.0–100.0)
Platelets: 300 10*3/uL (ref 150–400)
RBC: 3.84 MIL/uL — ABNORMAL LOW (ref 3.87–5.11)
RDW: 14.4 % (ref 11.5–15.5)
WBC: 11.1 10*3/uL — AB (ref 4.0–10.5)

## 2016-06-15 NOTE — Progress Notes (Signed)
Physical Therapy Treatment Patient Details Name: [name] [name] MRN: [id number] DOB: Jul 25, 1960 Today's Date: 06/15/2016    History of Present Illness 56 yo admitted with acute diverticulitis s/p colectomy and colostomy 4/18. PMhx: anxiety, GERD, HLD, HTN, obesity    PT Comments    Emphasis on transfer safety and progressive ambulation.    Follow Up Recommendations  Home health PT     Equipment Recommendations  None recommended by PT    Recommendations for Other Services       Precautions / Restrictions Precautions Precautions: Fall Precaution Comments: ostomy    Mobility  Bed Mobility Overal bed mobility: Modified Independent             General bed mobility comments: cued for 1 improvement for getting in bed better.  Transfers Overall transfer level: Modified independent Equipment used: Rolling walker (2 wheeled)                Ambulation/Gait Ambulation/Gait assistance: Supervision Ambulation Distance (Feet): 150 Feet Assistive device: Rolling walker (2 wheeled) Gait Pattern/deviations: Step-through pattern   Gait velocity interpretation: Below normal speed for age/gender General Gait Details: cues for posture, though guarded, pt steady and safe   Stairs            Wheelchair Mobility    Modified Rankin (Stroke Patients Only)       Balance Overall balance assessment: No apparent balance deficits (not formally assessed)                                          Cognition Arousal/Alertness: Awake/alert Behavior During Therapy: WFL for tasks assessed/performed Overall Cognitive Status: Within Functional Limits for tasks assessed                                        Exercises      General Comments General comments (skin integrity, edema, etc.): sats on RA>90% and EHR 95 bpm      Pertinent Vitals/Pain Pain Assessment: Faces Pain Score: 5  Faces Pain Scale: Hurts little more Pain Location:  abdomen Pain Descriptors / Indicators: Discomfort;Sore Pain Intervention(s): Monitored during session    Home Living                      Prior Function            PT Goals (current goals can now be found in the care plan section) Acute Rehab PT Goals Patient Stated Goal: return to home and beach PT Goal Formulation: With patient/family Time For Goal Achievement: 06/26/16 Potential to Achieve Goals: Good Progress towards PT goals: Progressing toward goals    Frequency    Min 3X/week      PT Plan Current plan remains appropriate    Co-evaluation             End of Session   Activity Tolerance: Patient tolerated treatment well Patient left: with call bell/phone within reach;with family/visitor present;in bed Nurse Communication: Mobility status PT Visit Diagnosis: Difficulty in walking, not elsewhere classified (R26.2)     Time: 4097-3532 PT Time Calculation (min) (ACUTE ONLY): 14 min  Charges:  $Gait Training: 8-22 mins                    G Codes:  06/15/2016  [name] [name], [place] [phone]  (pager)   [name] [name] [name] [name] 06/15/2016, 6:29 PM

## 2016-06-15 NOTE — Progress Notes (Addendum)
[facility] Progress Note  5 Days Post-Op  Subjective: CC: recurrent diverticulitis s/p partial colectomy/colostomy Pain controlled. Having colostomy output. Tolerating a diet. Ambulating to bathroom/in her room. Has not mobilized in the hallway since Friday. Requesting to see [place] again today for further colostomy education.   Objective: Vital signs in last 24 hours: Temp:  [97.9 F (36.6 C)-98.1 F (36.7 C)] 98.1 F (36.7 C) (04/23 0515) Pulse Rate:  [64-70] 70 (04/23 0515) Resp:  [17-20] 17 (04/23 0515) BP: (98-115)/(65-72) 115/69 (04/23 0515) SpO2:  [92 %-95 %] 93 % (04/23 0515) Last BM Date: 06/14/16  Intake/Output from previous day: 04/22 0701 - 04/23 0700 In: 50 [IV Piggyback:50] Out: 2465 [Urine:2400; Drains:65] Intake/Output this shift: No intake/output data recorded.  PE: Gen:  Alert, NAD, pleasant and cooperative Card:  Regular rate and rhythm Pulm:  Non-labored, clear to auscultation bilaterally Abd: Soft, appropriately tender, non-distended, stoma viable, gas and stool in colostomy pouch. Midline incision, dressing removed and incision c/d/I, staples in place, no erythema or drainage. Drain with SS drainage.  Lab Results:   Recent Labs  06/14/16 0537  WBC 11.7*  HGB 10.7*  HCT 33.4*  PLT 288   BMET  Recent Labs  06/14/16 0537  NA 138  K 4.2  CL 101  CO2 27  GLUCOSE 103*  BUN 11  CREATININE 0.76  CALCIUM 8.7*   PT/INR No results for input(s): LABPROT, INR in the last 72 hours. CMP     Component Value Date/Time   NA 138 06/14/2016 0537   K 4.2 06/14/2016 0537   CL 101 06/14/2016 0537   CO2 27 06/14/2016 0537   GLUCOSE 103 (H) 06/14/2016 0537   BUN 11 06/14/2016 0537   CREATININE 0.76 06/14/2016 0537   CALCIUM 8.7 (L) 06/14/2016 0537   PROT 7.7 06/07/2016 1845   ALBUMIN 4.2 06/07/2016 1845   AST 34 06/07/2016 1845   ALT 32 06/07/2016 1845   ALKPHOS 76 06/07/2016 1845   BILITOT 0.6 06/07/2016 1845   GFRNONAA >60  06/14/2016 0537   GFRAA >60 06/14/2016 0537   Lipase     Component Value Date/Time   LIPASE 20 06/07/2016 1845   Anti-infectives: Anti-infectives    Start     Dose/Rate Route Frequency Ordered Stop   06/09/16 1000  ertapenem (INVANZ) 1 g in sodium chloride 0.9 % 50 mL IVPB     1 g 100 mL/hr over 30 Minutes Intravenous Every 24 hours 06/09/16 0934 06/16/16 2359   06/08/16 0600  piperacillin-tazobactam (ZOSYN) IVPB 3.375 g  Status:  Discontinued     3.375 g 12.5 mL/hr over 240 Minutes Intravenous Every 8 hours 06/08/16 0228 06/09/16 0934   06/07/16 2115  piperacillin-tazobactam (ZOSYN) IVPB 3.375 g     3.375 g 100 mL/hr over 30 Minutes Intravenous  Once 06/07/16 2108 06/07/16 2200     Assessment/Plan Acute sigmoid diverticulitis w/ adjacent extraluminal air POD #3 S/P OPEN SIGMOID COLECTOMY, DRAINAGE OF INTRA-ABDOMINAL ABSCESS, MOBILIZATION OF SPLENIC FLEXURE, COLOSTOMY WITH HARTMANN PROCEDURE, 06/11/16 Dr. Georganna Skeans  - pain control: prn tylenol, intermittent fentanyl, Robaxin PRN, oxycodone  - IS/pulm toilet, needs to be oob/ ambulating  Leukocytosis-continue to follow, should receive 7 d abx postop [name]) HTN - home meds, hydralazine PRN CKD 1 w/ AKI- Resolved with d/c nephrotoxic agents and IVF; Creatinine 0.87 Depression- Zoloft  FEN - regualr diet, BMET pending  ID: Zosyn 4/15-4/17, [name] 4/17 >> VTE: SCD's, Lovenox    Plan: continue IV abx. CBC in AM.  PT and WOC today Possible d/c tomorrow on Augmentin w/ HH for colostomy care    LOS: 8 days    Jill Alexanders , [facility] 06/15/2016, 8:05 AM Pager: [phone] Consults: [phone] Mon-Fri 7:00 am-4:30 pm Sat-Sun 7:00 am-11:30 am        Pt seen and examined   Discussed plans Wound looks good ostomy sunk but viable

## 2016-06-15 NOTE — Care Management Note (Addendum)
Case Management Note  Patient Details  Name: [name] [name] MRN: [id number] Date of Birth: 08-09-1960  Subjective/Objective:   s/p partial colectomy/colostomy                 Action/Plan: Discharge Planning: Please see previous NCM notes. Pt requested [facility] for HH. Has RW and 3n1 bedside commode at home.   PCP Shon Baton MD  06/16/2016 [place] notified for [facility] RN for scheduled dc home today.    Expected Discharge Date:             Expected Discharge Plan:  [place]  In-House Referral:  NA  Discharge planning Services  CM Consult  Post Acute Care Choice:  Home Health Choice offered to:  Patient  DME Arranged:  N/A DME Agency:  NA  HH Arranged:  RN [place] Agency:  [place]  Status of Service:  Completed, signed off  If discussed at [place] of Stay Meetings, dates discussed:    Additional Comments:  Erenest Rasher, RN 06/15/2016, 6:44 PM

## 2016-06-15 NOTE — Progress Notes (Signed)
Occupational Therapy Treatment Patient Details Name: OAKLEIGH HESKETH MRN: [id number] DOB: 03/18/1960 Today's Date: 06/15/2016    History of present illness 56 yo admitted with acute diverticulitis s/p colectomy and colostomy 4/18. PMhx: anxiety, GERD, HLD, HTN, obesity   OT comments  Pt making steady progress. Educated pt on use of AE for LB ADL, discussed home set up to maximize her independence and reduce pain during mobility and ADL. Pt ambulated into hallway without RW. Will continue to follow acutely.   Follow Up Recommendations  Supervision - Intermittent    Equipment Recommendations  3 in 1 bedside commode    Recommendations for Other Services      Precautions / Restrictions Precautions Precautions: Fall Precaution Comments: ostomy       Mobility Bed Mobility Overal bed mobility: Modified Independent                Transfers Overall transfer level: Modified independent                    Balance Overall balance assessment: No apparent balance deficits (not formally assessed)                                         ADL either performed or assessed with clinical judgement   ADL Overall ADL's : Needs assistance/impaired                                     Functional mobility during ADLs: Supervision/safety;Rolling walker General ADL Comments: Demonstrated use of AE for LB ADL. PT verbalized understanding. Educated pt on reducing risk of falls and proper set up of home to facilitate independence with ADL and mobility.Pt states she has a 3in1 at home     Vision       Perception     Praxis      Cognition Arousal/Alertness: Awake/alert Behavior During Therapy: WFL for tasks assessed/performed Overall Cognitive Status: Within Functional Limits for tasks assessed                                          Exercises     Shoulder Instructions       General Comments      Pertinent Vitals/  Pain       Pain Assessment: 0-10 Pain Score: 5  Pain Location: abdomen Pain Descriptors / Indicators: Discomfort;Sore Pain Intervention(s): Limited activity within patient's tolerance  Home Living                                          Prior Functioning/Environment              Frequency           Progress Toward Goals  OT Goals(current goals can now be found in the care plan section)  Progress towards OT goals: Progressing toward goals  Acute Rehab OT Goals Patient Stated Goal: return to home and beach OT Goal Formulation: With patient/family Time For Goal Achievement: 06/27/16 Potential to Achieve Goals: Good ADL Goals Pt Will Perform Lower Body Bathing: with supervision;sit to/from stand Pt Will Perform Lower  Body Dressing: with supervision;sit to/from stand Pt Will Transfer to Toilet: with supervision;ambulating;bedside commode Pt Will Perform Toileting - Clothing Manipulation and hygiene: with supervision;sit to/from stand Pt Will Perform Tub/Shower Transfer: with supervision;Tub transfer;ambulating;rolling walker  Plan Discharge plan needs to be updated    Co-evaluation                 End of Session Equipment Utilized During Treatment: Rolling walker  OT Visit Diagnosis: Unsteadiness on feet (R26.81);Muscle weakness (generalized) (M62.81);Pain Pain - part of body:  (abdomen)   Activity Tolerance Patient tolerated treatment well   Patient Left in bed;with call bell/phone within reach;with family/visitor present   Nurse Communication Mobility status        Time: 9396-8864 OT Time Calculation (min): 17 min  Charges: OT General Charges $OT Visit: 1 Procedure OT Treatments $Self Care/Home Management : 8-22 mins  [facility], OT/L  [phone] 06/15/2016   Lorenz Donley,[place] 06/15/2016, 4:49 PM

## 2016-06-16 LAB — CBC
HCT: 34.1 % — ABNORMAL LOW (ref 36.0–46.0)
HEMOGLOBIN: 10.8 g/dL — AB (ref 12.0–15.0)
MCH: 28.6 pg (ref 26.0–34.0)
MCHC: 31.7 g/dL (ref 30.0–36.0)
MCV: 90.5 fL (ref 78.0–100.0)
PLATELETS: 320 10*3/uL (ref 150–400)
RBC: 3.77 MIL/uL — AB (ref 3.87–5.11)
RDW: 14.5 % (ref 11.5–15.5)
WBC: 11.3 10*3/uL — AB (ref 4.0–10.5)

## 2016-06-16 MED ORDER — AMOXICILLIN-POT CLAVULANATE 875-125 MG PO TABS
1.0000 | ORAL_TABLET | Freq: Two times a day (BID) | ORAL | Status: DC
  Administered 2016-06-16: 1 via ORAL
  Filled 2016-06-16: qty 1

## 2016-06-16 NOTE — Consult Note (Signed)
[place] Nurse ostomy follow up Stoma type/location: LLQ, end colostomy Pouch change and education with patient and husband yesterday. Today pouch intact, dropped off additional dietary information, caregiver information, and [employer] brochure   Enrolled patient in [place] Start Discharge program: Yes  Additional supplies left in the room for the patient her use, and possible DC.  WOC Nurse will follow along with you for continued support with ostomy teaching and care [place] MSN, [place], [place], [place]

## 2016-06-16 NOTE — Progress Notes (Signed)
Physical Therapy Treatment Patient Details Name: [name] [name] MRN: [id number] DOB: Feb 07, 1961 Today's Date: 06/16/2016    History of Present Illness 56 yo admitted with acute diverticulitis s/p colectomy and colostomy 4/18. PMhx: anxiety, GERD, HLD, HTN, obesity    PT Comments    Progressing back toward baseline functioning with some abdominal pain slowing down bed mobility.  Ready for d/c when released medically.   Follow Up Recommendations  No PT follow up     Equipment Recommendations  None recommended by PT    Recommendations for Other Services       Precautions / Restrictions Precautions Precautions: Fall Precaution Comments: ostomy    Mobility  Bed Mobility Overal bed mobility: Modified Independent                Transfers Overall transfer level: Modified independent                  Ambulation/Gait Ambulation/Gait assistance: Supervision Ambulation Distance (Feet): 300 Feet Assistive device: Rolling walker (2 wheeled) Gait Pattern/deviations: Step-through pattern   Gait velocity interpretation: at or above normal speed for age/gender General Gait Details: generally steady, prefers slower speed, but can speed up to age appropriate level   Stairs Stairs: Yes   Stair Management: One rail Right;Alternating pattern;Forwards Number of Stairs: 3 General stair comments: safe with rail  Wheelchair Mobility    Modified Rankin (Stroke Patients Only)       Balance Overall balance assessment: Needs assistance   Sitting balance-Leahy Scale: Good       Standing balance-Leahy Scale: Good                              Cognition Arousal/Alertness: Awake/alert Behavior During Therapy: WFL for tasks assessed/performed Overall Cognitive Status: Within Functional Limits for tasks assessed                                        Exercises      General Comments        Pertinent Vitals/Pain Pain Assessment:  Faces Faces Pain Scale: Hurts little more Pain Location: abdomen Pain Descriptors / Indicators: Discomfort;Sore Pain Intervention(s): Monitored during session    Home Living                      Prior Function            PT Goals (current goals can now be found in the care plan section) Acute Rehab PT Goals Patient Stated Goal: return to home and beach PT Goal Formulation: With patient Time For Goal Achievement: 06/26/16 Potential to Achieve Goals: Good Progress towards PT goals: Progressing toward goals    Frequency    Min 3X/week      PT Plan Current plan remains appropriate    Co-evaluation             End of Session   Activity Tolerance: Patient tolerated treatment well Patient left: in bed;with call bell/phone within reach Nurse Communication: Mobility status PT Visit Diagnosis: Difficulty in walking, not elsewhere classified (R26.2)     Time: 1194-1740 PT Time Calculation (min) (ACUTE ONLY): 15 min  Charges:  $Gait Training: 8-22 mins                    G Codes:  06/16/2016  Donnella Sham, PT [phone]  (pager)   [name] [name] 06/16/2016, 4:28 PM

## 2016-06-16 NOTE — Progress Notes (Signed)
[facility] Progress Note  6 Days Post-Op  Subjective: CC: recurrent diverticulitis s/p partial colectomy/colostomy Pt feels much better today. Pain controlled. Having colostomy output - last stool was yesterday. Tolerating a diet. Ambulating to bathroom/in her room. Mobilizing and getting comfortable with colostomy pouch.   Objective: Vital signs in last 24 hours: Temp:  [97.9 F (36.6 C)-98.8 F (37.1 C)] 97.9 F (36.6 C) (04/24 0509) Pulse Rate:  [74-77] 74 (04/24 0509) Resp:  [18-20] 18 (04/24 0509) BP: (103-128)/(65-86) 128/86 (04/24 0509) SpO2:  [90 %-93 %] 92 % (04/24 0509) Last BM Date: 06/16/16  Intake/Output from previous day: 04/23 0701 - 04/24 0700 In: 890 [P.O.:840; IV Piggyback:50] Out: 20 [Drains:20] Intake/Output this shift: No intake/output data recorded.  PE: Gen:  Alert, NAD, pleasant and cooperative Card:  Regular rate and rhythm Pulm:  Non-labored, clear to auscultation bilaterally Abd: Soft, appropriately tender, non-distended, stoma viable, gas and stool in colostomy pouch. Midline incision, dressing removed and incision c/d/I, staples in place, no erythema or drainage. Drain with SS drainage (20 cc/24h)  Lab Results:   Recent Labs  06/15/16 0723 06/16/16 0319  WBC 11.1* 11.3*  HGB 10.9* 10.8*  HCT 34.5* 34.1*  PLT 300 320   BMET  Recent Labs  06/14/16 0537 06/15/16 0723  NA 138 136  K 4.2 4.3  CL 101 98*  CO2 27 29  GLUCOSE 103* 93  BUN 11 12  CREATININE 0.76 0.88  CALCIUM 8.7* 8.7*   PT/INR No results for input(s): LABPROT, INR in the last 72 hours. CMP     Component Value Date/Time   NA 136 06/15/2016 0723   K 4.3 06/15/2016 0723   CL 98 (L) 06/15/2016 0723   CO2 29 06/15/2016 0723   GLUCOSE 93 06/15/2016 0723   BUN 12 06/15/2016 0723   CREATININE 0.88 06/15/2016 0723   CALCIUM 8.7 (L) 06/15/2016 0723   PROT 7.7 06/07/2016 1845   ALBUMIN 4.2 06/07/2016 1845   AST 34 06/07/2016 1845   ALT 32 06/07/2016  1845   ALKPHOS 76 06/07/2016 1845   BILITOT 0.6 06/07/2016 1845   GFRNONAA >60 06/15/2016 0723   GFRAA >60 06/15/2016 0723   Lipase     Component Value Date/Time   LIPASE 20 06/07/2016 1845       Studies/Results: No results found.  Anti-infectives: Anti-infectives    Start     Dose/Rate Route Frequency Ordered Stop   06/09/16 1000  ertapenem (INVANZ) 1 g in sodium chloride 0.9 % 50 mL IVPB     1 g 100 mL/hr over 30 Minutes Intravenous Every 24 hours 06/09/16 0934 06/16/16 2359   06/08/16 0600  piperacillin-tazobactam (ZOSYN) IVPB 3.375 g  Status:  Discontinued     3.375 g 12.5 mL/hr over 240 Minutes Intravenous Every 8 hours 06/08/16 0228 06/09/16 0934   06/07/16 2115  piperacillin-tazobactam (ZOSYN) IVPB 3.375 g     3.375 g 100 mL/hr over 30 Minutes Intravenous  Once 06/07/16 2108 06/07/16 2200       Assessment/Plan Acute sigmoid diverticulitis w/ adjacent extraluminal air S/P OPEN SIGMOID COLECTOMY, DRAINAGE OF INTRA-ABDOMINAL ABSCESS, MOBILIZATION OF SPLENIC FLEXURE, COLOSTOMY WITH HARTMANN PROCEDURE, 06/11/16 Dr. Georganna Skeans  - pain control: prn tylenol, Robaxin PRN, oxycodone  - IS/pulm toilet, needs to be oob/ ambulating  Leukocytosis-continue to follow, 11.3 today HTN - home meds, hydralazine PRN CKD 1 w/ AKI- Resolved with d/c nephrotoxic agents and IVF; Creatinine 0.87 Depression- Zoloft  FEN - regualr diet, BMET pending  ID: Zosyn 4/15-4/17[name] 4/17 >> VTE: SCD's, Lovenox    Plan: Start PO Augmentin.  Discharge today vs tomorrow - will discuss with MD. D/c drain prior to discharge    LOS: 9 days    Jill Alexanders , [facility] Surgery 06/16/2016, 10:33 AM Pager: [phone] Consults: [phone] Mon-Fri 7:00 am-4:30 pm Sat-Sun 7:00 am-11:30 am

## 2016-06-17 LAB — CBC
HCT: 32.7 % — ABNORMAL LOW (ref 36.0–46.0)
Hemoglobin: 10.5 g/dL — ABNORMAL LOW (ref 12.0–15.0)
MCH: 29.1 pg (ref 26.0–34.0)
MCHC: 32.1 g/dL (ref 30.0–36.0)
MCV: 90.6 fL (ref 78.0–100.0)
PLATELETS: 329 10*3/uL (ref 150–400)
RBC: 3.61 MIL/uL — AB (ref 3.87–5.11)
RDW: 14.9 % (ref 11.5–15.5)
WBC: 9.8 10*3/uL (ref 4.0–10.5)

## 2016-06-17 MED ORDER — ACETAMINOPHEN 500 MG PO TABS: 500.0000 mg | ORAL_TABLET | Freq: Four times a day (QID) | ORAL | 0 refills | Status: DC | PRN

## 2016-06-17 MED ORDER — AMOXICILLIN-POT CLAVULANATE 875-125 MG PO TABS: 1.0000 | ORAL_TABLET | Freq: Two times a day (BID) | ORAL | 0 refills | Status: AC

## 2016-06-17 MED ORDER — OXYCODONE HCL 5 MG PO TABS: 5.0000 mg | ORAL_TABLET | ORAL | 0 refills | Status: DC | PRN

## 2016-06-17 NOTE — Progress Notes (Signed)
[name] to be D/C'd Home per MD order.  Discussed with the patient and all questions fully answered.  VSS, Skin clean, dry and intact without evidence of skin break down, no evidence of skin tears noted. IV catheter discontinued intact. Site without signs and symptoms of complications. Dressing and pressure applied.  An After Visit Summary was printed and given to the patient. Patient received prescription.  D/c education completed with patient/family including follow up instructions, medication list, d/c activities limitations if indicated, with other d/c instructions as indicated by MD - patient able to verbalize understanding, all questions fully answered.   Patient instructed to return to ED, call 911, or call MD for any changes in condition.   Patient escorted via [place], and D/C home via private auto.  [name] [name] 06/17/2016 11:01 AM

## 2016-06-17 NOTE — Discharge Summary (Signed)
[facility] Discharge Summary   Patient ID: [name] [name] MRN: [id number] DOB/AGE: Jul 26, 1960 56 y.o.  Admit date: 06/07/2016 Discharge date: 06/17/2016  Discharge Diagnosis Patient Active Problem List   Diagnosis Date Noted  . Diverticulitis, recurrent 06/07/2016  . Diverticuitis sigmoid colon with perforation and abscess 08/26/2012  . Expected blood loss anemia 11/24/2011  . Obese 11/24/2011  . 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 Gastritis 11/04/2011   Consultants None   Imaging: 06/07/16 CT RENAL STONE STUDY -  1. There is mild stranding of pericolonic fat and mild thickening of colonic wall axial image 72 proximal left colon left lower quadrant. Small extraluminal air measures 2 cm. Findings consistent with perforated acute diverticulitis. No pericolonic abscess is noted. Additional foci of free air are noted in upper right and left abdomen. 2. Moderate stool and some gas noted in cecum. No pericecal inflammation. Normal appendix.  Procedures Dr. Georganna Skeans (06/10/16) - open sigmoid colectomy with drainage of abdominal abscess and mobilization of splenic flexure. Colostomy with Hartmann procedure.   Hospital Course:  56 y.o. Female with a history of obesity, HTN, diverticulitis, GERD, and anxiety who presented to [facility] ED on 06/07/16 with left-sided abdominal pain x24 hours associated with nausea. Lab work significant for leukocytosis and CT scan (above) significant for perforated diverticulitis. Patient was admitted for bowel rest and IV antibiotics. Patient failed to improve with non-operative management, remaining significantly tender in her LLQ, and was taken for the above operation by Dr. Grandville Silos. Patient tolerated the procedure well. On 06/17/16 the patients pain was controlled, tolerating PO, having colostomy output, mobilizing with therpapies and was medically stable for discharge home. She will complete a 1 week course of Augmentin and will receive home health  for colostomy care and PT. She will follow up in our office as below.   I have personally reviewed the patients medication history on the [place] controlled substance database.  Physical Exam: General:  Alert, NAD, pleasant, comfortable Abd:  Soft,obese, ND, mild tenderness, incisions C/D/I, colostomy pouch with good seal and flatus in pouch.  Allergies as of 06/17/2016      Reactions   Dilaudid [hydromorphone Hcl] Itching   Quinapril Hcl Hives, Swelling   Yellow Jacket Venom [bee Venom] Swelling   Swelling at site of bite      Medication List    STOP taking these medications   amoxicillin 500 MG capsule Commonly known as:  AMOXIL   aspirin-acetaminophen-caffeine 250-250-65 MG tablet Commonly known as:  EXCEDRIN MIGRAINE   oxyCODONE-acetaminophen 5-325 MG tablet Commonly known as:  PERCOCET/ROXICET     TAKE these medications   acetaminophen 500 MG tablet Commonly known as:  TYLENOL Take 1 tablet (500 mg total) by mouth every 6 (six) hours as needed for mild pain.   amoxicillin-clavulanate 875-125 MG tablet Commonly known as:  AUGMENTIN Take 1 tablet by mouth every 12 (twelve) hours.   fluticasone 50 MCG/ACT nasal spray Commonly known as:  FLONASE Place 1 spray into both nostrils daily as needed for allergies.   loratadine 10 MG tablet Commonly known as:  CLARITIN Take 10 mg by mouth daily as needed (seasonal allergies).   meloxicam 15 MG tablet Commonly known as:  MOBIC TAKE 1 TABLET BY MOUTH EVERY DAY What changed:  See the new instructions.   ondansetron 4 MG tablet Commonly known as:  ZOFRAN Take 1 tablet (4 mg total) by mouth every 6 (six) hours.   oxyCODONE 5 MG immediate release tablet Commonly known as:  Oxy IR/ROXICODONE Take 1-2 tablets (5-10 mg total) by mouth every 4 (four) hours as needed for moderate pain.   pantoprazole 40 MG tablet Commonly known as:  PROTONIX Take 40 mg by mouth daily. Reported on 06/14/2015   rOPINIRole 0.25 MG tablet Commonly  known as:  REQUIP Take 0.25 mg by mouth at bedtime.   sertraline 100 MG tablet Commonly known as:  ZOLOFT Take 100 mg by mouth daily.   triamterene-hydrochlorothiazide 37.5-25 MG tablet Commonly known as:  MAXZIDE-25 Take 1 tablet by mouth daily.   Vitamin D3 1000 units Caps Take 1,000 Units by mouth daily.        Follow-up Information    Zenovia Jarred, MD. Schedule an appointment as soon as possible for a visit on 07/08/2016.   Specialty:  General Surgery Why:  at 11:00 AM for post-operative follow up. please arrive 15 minutes early. Contact information: [address] [place] [address] [phone]        [facility], [place]. Go on 06/24/2016.   Specialty:  General Surgery Why:  at 10:00 AM for staple removal by a nurse. Please arrive 30 minutes early to get checked in and fill out any necessary paperwork.  Contact information: [facility] Follow up.   Why:  Home Health RN and Physical Therapy Contact information: [place] [address] [phone]          Signed: Obie Dredge, [facility] 06/17/2016, 7:36 AM Pager: [phone] Consults: [phone] Mon-Fri 7:00 am-4:30 pm Sat-Sun 7:00 am-11:30 am

## 2016-06-17 NOTE — Discharge Instructions (Signed)
[facility]      [facility] °[phone] ° °OPEN ABDOMINAL SURGERY: POST OP INSTRUCTIONS ° °Always review your discharge instruction sheet given to you by the facility where your surgery was performed. ° °IF YOU HAVE DISABILITY OR FAMILY LEAVE FORMS, YOU MUST BRING THEM TO THE OFFICE FOR PROCESSING.  PLEASE DO NOT GIVE THEM TO YOUR DOCTOR. ° °1. A prescription for pain medication may be given to you upon discharge.  Take your pain medication as prescribed, if needed.  If narcotic pain medicine is not needed, then you may take acetaminophen (Tylenol) or ibuprofen (Advil) as needed. °2. Take your usually prescribed medications unless otherwise directed. °3. If you need a refill on your pain medication, please contact your pharmacy. They will contact our office to request authorization.  Prescriptions will not be filled after 5pm or on week-ends. °4. You should follow a light diet the first few days after arrival home, such as soup and crackers, pudding, etc.unless your doctor has advised otherwise. A high-fiber, low fat diet can be resumed as tolerated.   Be sure to include lots of fluids daily. Most patients will experience some swelling and bruising on the chest and neck area.  Ice packs will help.  Swelling and bruising can take several days to resolve °5. Most patients will experience some swelling and bruising in the area of the incision. Ice pack will help. Swelling and bruising can take several days to resolve..  °6. It is common to experience some constipation if taking pain medication after surgery.  Increasing fluid intake and taking a stool softener will usually help or prevent this problem from occurring.  A mild laxative (Milk of Magnesia or Miralax) should be taken according to package directions if there are no bowel movements after 48 hours. °7.  You may have steri-strips (small skin tapes) in place directly over the incision.  These strips should be left on the skin for 7-10 days.  If your  surgeon used skin glue on the incision, you may shower in 24 hours.  The glue will flake off over the next 2-3 weeks.  Any sutures or staples will be removed at the office during your follow-up visit. You may find that a light gauze bandage over your incision may keep your staples from being rubbed or pulled. You may shower and replace the bandage daily. °8. ACTIVITIES:  You may resume regular (light) daily activities beginning the next day--such as daily self-care, walking, climbing stairs--gradually increasing activities as tolerated.  You may have sexual intercourse when it is comfortable.  Refrain from any heavy lifting or straining until approved by your doctor. °a. You may drive when you no longer are taking prescription pain medication, you can comfortably wear a seatbelt, and you can safely maneuver your car and apply brakes °b. Return to Work: ___________________________________ °9. You should see your doctor in the office for a follow-up appointment approximately two weeks after your surgery.  Make sure that you call for this appointment within a day or two after you arrive home to insure a convenient appointment time. °OTHER INSTRUCTIONS:  °_____________________________________________________________ °_____________________________________________________________ ° °WHEN TO CALL YOUR DOCTOR: °1. Fever over 101.0 °2. Inability to urinate °3. Nausea and/or vomiting °4. Extreme swelling or bruising °5. Continued bleeding from incision. °6. Increased pain, redness, or drainage from the incision. °7. Difficulty swallowing or breathing °8. Muscle cramping or spasms. °9. Numbness or tingling in hands or feet or around lips. ° °The clinic staff is available to   answer your questions during regular business hours.  Please dont hesitate to call and ask to speak to one of the nurses if you have concerns.  For further questions, please visit [email or web address]    Colostomy, Adult, Care After Refer  to this sheet in the next few weeks. These instructions provide you with information about caring for yourself after your procedure. Your health care provider may also give you more specific instructions. Your treatment has been planned according to current medical practices, but problems sometimes occur. Call your health care provider if you have any problems or questions after your procedure. What can I expect after the procedure? After the procedure, it is common to have:  Swelling at the opening that was created during the procedure (stoma).  Slight bleeding around the stoma.  Redness around the stoma. Follow these instructions at home: Activity   Rest as needed while the stoma area heals.  Return to your normal activities as told by your health care provider. Ask your health care provider what activities are safe for you.  Avoid strenuous activity and abdominal exercises for 3 weeks or for as long as told by your health care provider.  Do not lift anything that is heavier than 10 lb (4.5 kg). Incision care    Follow instructions from your health care provider about how to take care of your incision. Make sure you:  Wash your hands with soap and water before you change your bandage (dressing). If soap and water are not available, use hand sanitizer.  Change your dressing as told by your health care provider.  Leave stitches (sutures), skin glue, or adhesive strips in place. These skin closures may need to stay in place for 2 weeks or longer. If adhesive strip edges start to loosen and curl up, you may trim the loose edges. Do not remove adhesive strips completely unless your health care provider tells you to do that. Stoma Care   Keep the stoma area clean.  Clean and dry the skin around the stoma each time you change the colostomy bag. To clean the stoma area:  Use warm water and only use cleansers that are recommended by your health care provider.  Rinse the stoma area with  plain water.  Dry the area well.  Use stoma powder or ointment on your skin only as told by your health care provider. Do not use any other powders, gels, wipes, or creams on your skin.  Check the stoma area every day for signs of infection. Check for:  More redness, swelling, or pain.  More fluid or blood.  Pus or warmth.  Measure the stoma opening regularly and record the size. Watch for changes. Share this information with your health care provider. Bathing   Do not take baths, swim, or use a hot tub until your health care provider approves. Ask your health care provider if you can take showers. You may be able to shower with or without the colostomy bag in place. If you bathe with the bag on, dry the bag afterward.  Avoid using harsh or oily soaps when you bathe. Colostomy Bag Care   Follow instructions from your health care provider about how to empty or change the colostomy bag.  Keep colostomy supplies with you at all times.  Store all supplies in a cool, dry place.  Empty the colostomy bag:  Whenever it is one-third to one-half full.  At bedtime.  Replace the bag every 2-4 days or as told by your  health care provider. Driving   Do not drive for 24 hours if you received a sedative.  Do not drive or operate heavy machinery while taking prescription pain medicine. General instructions   Follow instructions from your health care provider about eating or drinking restrictions.  Take over-the-counter and prescription medicines only as told by your health care provider.  Avoid wearing clothes that are tight directly over your stoma.  Do not use any tobacco products, such as cigarettes, chewing tobacco, and e-cigarettes. If you need help quitting, ask your health care provider.  (Women) Ask your health care provider about becoming pregnant and about using birth control. Medicines may not be absorbed normally after the procedure.  Keep all follow-up visits as told  by your health care provider. This is important. Contact a health care provider if:  You are having trouble caring for your stoma or changing the colostomy bag.  You feel nauseous or you vomit.  You have a fever.  You havemore redness, swelling, or pain at the site of your stoma or around your anus.  You have more fluid or blood coming from your stoma or your anus.  Your stoma area feels warm to the touch.  You have pus coming from your stoma.  You notice a change in the size or appearance of the stoma.  You have abdominal pain, bloating, pressure, or cramping.  Your have stool more often or less often than your health care provider tells you to expect.  You are not making much urine. This may be a sign of dehydration. Get help right away if:  Your abdominal pain does not go away or it becomes severe.  You keep vomiting.  Your stool is not draining through the stoma.  You have chest pain or an irregular heartbeat. This information is not intended to replace advice given to you by your health care provider. Make sure you discuss any questions you have with your health care provider. Document Released: 07/02/2010 Document Revised: 06/20/2015 Document Reviewed: 10/23/2014 Elsevier Interactive Patient Education  2017 [employer].

## 2016-09-14 ENCOUNTER — Telehealth: Payer: Self-pay | Admitting: Gastroenterology

## 2016-09-14 NOTE — Telephone Encounter (Signed)
Dr Ardis Hughs please advise if appt is needed prior to colon?

## 2016-09-16 ENCOUNTER — Telehealth: Payer: Self-pay

## 2016-09-16 NOTE — Telephone Encounter (Signed)
-----   Message from Milus Banister, MD sent at 09/16/2016  4:54 PM EDT ----- Happy to help. We'll reach out to her as well.  Salmaan Patchin, Can you set her up with rov next available with me or extender.  Thanks   ----- Message ----- From: Leighton Ruff, MD Sent: 9/79/1504   2:43 PM To: Milus Banister, MD  This lady underwent a Hartman's procedure for perforated diverticulitis in April of this year.  It looks like she is scheduled for a follow-up colonoscopy in 2019.  Do you mind going ahead and repeating her colonoscopy prior to me reversing her ostomy?  I have tentatively planned for reversal in October.  We will call and get her an apt  [place]

## 2016-09-16 NOTE — Telephone Encounter (Signed)
rov first  thanks

## 2016-09-17 NOTE — Telephone Encounter (Signed)
Appt scheduled for [name] [name] 7/27

## 2016-09-17 NOTE — Telephone Encounter (Signed)
The pt has been scheduled for ROV with Jessica for 09/18/16

## 2016-09-18 ENCOUNTER — Ambulatory Visit (INDEPENDENT_AMBULATORY_CARE_PROVIDER_SITE_OTHER): Payer: BC Managed Care – PPO | Admitting: Physician Assistant

## 2016-09-18 ENCOUNTER — Encounter: Payer: Self-pay | Admitting: Physician Assistant

## 2016-09-18 VITALS — BP 120/80 | HR 69 | Ht 60.0 in | Wt 239.0 lb

## 2016-09-18 DIAGNOSIS — Z933 Colostomy status: Secondary | ICD-10-CM

## 2016-09-18 DIAGNOSIS — Z8719 Personal history of other diseases of the digestive system: Secondary | ICD-10-CM | POA: Diagnosis not present

## 2016-09-18 DIAGNOSIS — Z1211 Encounter for screening for malignant neoplasm of colon: Secondary | ICD-10-CM | POA: Diagnosis not present

## 2016-09-18 MED ORDER — NA SULFATE-K SULFATE-MG SULF 17.5-3.13-1.6 GM/177ML PO SOLN: ORAL | 0 refills | Status: DC

## 2016-09-18 NOTE — Patient Instructions (Addendum)
Take Miralax 17 grams in 8 oz of water daily. You may start with 1/2 dose of Miralax daily.  The morning of the procedure on 9-12, after drinking the prep, do 2 fleets enemas, back to back.    You have been scheduled for a colonoscopy. Please follow written instructions given to you at your visit today.  Please pick up your prep supplies at the pharmacy within the next 1-3 days. [place].  If you use inhalers (even only as needed), please bring them with you on the day of your procedure. Your physician has requested that you go to www.[email or web address].com and enter the access code given to you at your visit today. This web site gives a general overview about your procedure. However, you should still follow specific instructions given to you by our office regarding your preparation for the procedure.

## 2016-09-18 NOTE — Progress Notes (Signed)
Subjective:    Patient ID: [name] [name], female    DOB: February 08, 1961, 56 y.o.   MRN: [id number]  HPI Tashena is a pleasant 56 year old white female known to Dr. Ardis Hughs who is referred today by Dr. Georganna Skeans for consideration of colonoscopy. Patient has history of morbid obesity, she is status post right total knee replacement and has history of recurrent diverticulitis. She was hospitalized in 2014, treated through the emergency room in 2017 and then admitted in April 2018 with acute perforated sigmoid diverticulitis. She was managed on the surgical service initially with IV antibiotics but failed to improve and then underwent open sigmoid colectomy, drainage of intra-abdominal abscess, mobilization of the splenic flexure and colostomy with Hartmann procedure on 06/10/2016. She had an uneventful postoperative course and has been recovering since. She's not currently having any problems with abdominal pain. She does mention that she continues to have some problems with incomplete evacuation of her bowel. She says even with a colostomy she still has frequent small bowel movements area and She says she had been placed on fiber supplements per Dr. Ardis Hughs several years ago which she continues to take but has not noticed much change.   She is anticipating undergoing colostomy reversal in October 2018 and colonoscopy is requested preoperatively. Last colonoscopy was done 2009 showing left-sided diverticulosis and internal hemorrhoids, no polyps.  Review of Systems Pertinent positive and negative review of systems were noted in the above HPI section.  All other review of systems was otherwise negative.  Outpatient Encounter Prescriptions as of 09/18/2016  Medication Sig  . Cholecalciferol (VITAMIN D3) 1000 units CAPS Take 1,000 Units by mouth daily.   . fluticasone (FLONASE) 50 MCG/ACT nasal spray Place 1 spray into both nostrils daily as needed for allergies.   Marland Kitchen loratadine (CLARITIN) 10 MG tablet  Take 10 mg by mouth daily as needed (seasonal allergies).  . meloxicam (MOBIC) 15 MG tablet TAKE 1 TABLET BY MOUTH EVERY DAY (Patient taking differently: TAKE 1 TABLET BY MOUTH EVERY DAY AS NEEDED FOR OSTEOARTHRITIS)  . ondansetron (ZOFRAN) 4 MG tablet Take 1 tablet (4 mg total) by mouth every 6 (six) hours.  . pantoprazole (PROTONIX) 40 MG tablet Take 40 mg by mouth daily. Reported on 06/14/2015  . rOPINIRole (REQUIP) 0.25 MG tablet Take 0.25 mg by mouth at bedtime.   . sertraline (ZOLOFT) 100 MG tablet Take 100 mg by mouth daily.   Marland Kitchen triamterene-hydrochlorothiazide (MAXZIDE-25) 37.5-25 MG per tablet Take 1 tablet by mouth daily.  . Na Sulfate-K Sulfate-Mg Sulf 17.5-3.13-1.6 GM/180ML SOLN Take as directed for colonoscopy  . [DISCONTINUED] acetaminophen (TYLENOL) 500 MG tablet Take 1 tablet (500 mg total) by mouth every 6 (six) hours as needed for mild pain.  . [DISCONTINUED] oxyCODONE (OXY IR/ROXICODONE) 5 MG immediate release tablet Take 1-2 tablets (5-10 mg total) by mouth every 4 (four) hours as needed for moderate pain.   No facility-administered encounter medications on file as of 09/18/2016.    Allergies  Allergen Reactions  . Dilaudid [Hydromorphone Hcl] Itching  . Quinapril Hcl Hives and Swelling  . Yellow Jacket Venom [Bee Venom] Swelling    Swelling at site of bite   Patient Active Problem List   Diagnosis Date Noted  . Diverticulitis 06/07/2016  . Diverticuitis sigmoid colon with perforation and abscess 08/26/2012  . Expected blood loss anemia 11/24/2011  . Obese 11/24/2011  . Hypokalemia 11/24/2011  . 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 Gastritis 11/04/2011  . DYSPHAGIA 01/10/2008   Social  History   Social History  . Marital status: Married    Spouse name: N/A  . Number of children: 1  . Years of education: N/A   Occupational History  . [employer]   Social History Main Topics  . Smoking status: Never Smoker  . Smokeless tobacco: Never Used  . Alcohol  use Yes     Comment: occassionally  . Drug use: No  . Sexual activity: Yes     Comment: ablation   Other Topics Concern  . Not on file   Social History Narrative   Daily caffeine     Ms. Marez's family history includes Bladder Cancer in her father; Bone cancer in her father; Hypertension in her mother.      Objective:    Vitals:   09/18/16 1416  BP: 120/80  Pulse: 69    Physical Exam well-developed white female in no acute distress, pleasant blood pressure 120/80 pulse 69, height 5 foot, weight 239, BMI 46.6. HEENT; nontraumatic normocephalic EOMI PERRLA sclera anicteric, Cardiovascular; regular rate and rhythm with S1-S2 no murmur or gallop, Pulmonary ;clear bilaterally, Abdomen; obese, soft, long midline incisional scar and colostomy in the left mid quadrant , nontender, there is no palpable mass or hepatosplenomegaly, Rectal ;exam not done, Extremities; no clubbing cyanosis or edema skin warm and dry, Neuropsych; mood and affect appropriate       Assessment & Plan:   #24 56 year old white female with history of recurrent diverticulitis status post perforated sigmoid diverticulitis April 2018 with sigmoid colectomy, drainage of intra-abdominal abscess and colostomy on 06/10/2016. Reversal of colostomy planned for October 2018. #2 colon cancer surveillance-last colonoscopy 2009, no polyps #3 morbid obesity-BMI 46 #4 obstructive sleep apnea #5 status post right total knee replacement #6 mild constipation/incomplete bowel evacuation  Plan; Continue  daily fiber supplement, add MiraLAX 17 g in 8 ounces of water starting with one half dose daily and then titrating to one full dose daily if needed to see if this will help with more complete bowel evacuation. Patient will be scheduled for colonoscopy with Dr. Ardis Hughs. She will undergo regular bowel prep and then also fleets enemas prior to colonoscopy so that rectosigmoid can also be evaluated. Procedure discussed in detail with  patient including risks and benefits and she is agreeable to proceed.    Takeru Bose Genia Harold PA-C 09/18/2016   Cc: Shon Baton, MD

## 2016-09-18 NOTE — Progress Notes (Signed)
I agree with the above note, plan 

## 2016-10-21 ENCOUNTER — Encounter: Payer: Self-pay | Admitting: Gastroenterology

## 2016-11-04 ENCOUNTER — Ambulatory Visit (AMBULATORY_SURGERY_CENTER): Payer: BC Managed Care – PPO | Admitting: Gastroenterology

## 2016-11-04 ENCOUNTER — Encounter: Payer: Self-pay | Admitting: Gastroenterology

## 2016-11-04 VITALS — BP 117/77 | HR 59 | Temp 97.5°F | Resp 14 | Ht 60.0 in | Wt 239.0 lb

## 2016-11-04 DIAGNOSIS — Z8719 Personal history of other diseases of the digestive system: Secondary | ICD-10-CM

## 2016-11-04 DIAGNOSIS — K5730 Diverticulosis of large intestine without perforation or abscess without bleeding: Secondary | ICD-10-CM | POA: Diagnosis not present

## 2016-11-04 DIAGNOSIS — K6389 Other specified diseases of intestine: Secondary | ICD-10-CM

## 2016-11-04 DIAGNOSIS — K649 Unspecified hemorrhoids: Secondary | ICD-10-CM

## 2016-11-04 DIAGNOSIS — D125 Benign neoplasm of sigmoid colon: Secondary | ICD-10-CM

## 2016-11-04 DIAGNOSIS — K635 Polyp of colon: Secondary | ICD-10-CM

## 2016-11-04 MED ORDER — SODIUM CHLORIDE 0.9 % IV SOLN: 500.0000 mL | INTRAVENOUS | Status: DC

## 2016-11-04 NOTE — Progress Notes (Signed)
Called to room to assist during endoscopic procedure.  Patient ID and intended procedure confirmed with present staff. Received instructions for my participation in the procedure from the performing physician.  

## 2016-11-04 NOTE — Progress Notes (Signed)
Report given to PACU, vss 

## 2016-11-04 NOTE — Patient Instructions (Signed)
YOU HAD AN ENDOSCOPIC PROCEDURE TODAY AT THE  ENDOSCOPY CENTER:   Refer to the procedure report that was given to you for any specific questions about what was found during the examination.  If the procedure report does not answer your questions, please call your gastroenterologist to clarify.  If you requested that your care partner not be given the details of your procedure findings, then the procedure report has been included in a sealed envelope for you to review at your convenience later.  YOU SHOULD EXPECT: Some feelings of bloating in the abdomen. Passage of more gas than usual.  Walking can help get rid of the air that was put into your GI tract during the procedure and reduce the bloating. If you had a lower endoscopy (such as a colonoscopy or flexible sigmoidoscopy) you may notice spotting of blood in your stool or on the toilet paper. If you underwent a bowel prep for your procedure, you may not have a normal bowel movement for a few days.  Please Note:  You might notice some irritation and congestion in your nose or some drainage.  This is from the oxygen used during your procedure.  There is no need for concern and it should clear up in a day or so.  SYMPTOMS TO REPORT IMMEDIATELY:   Following lower endoscopy (colonoscopy or flexible sigmoidoscopy):  Excessive amounts of blood in the stool  Significant tenderness or worsening of abdominal pains  Swelling of the abdomen that is new, acute  Fever of 100F or higher  For urgent or emergent issues, a gastroenterologist can be reached at any hour by calling [phone].   DIET:  We do recommend a small meal at first, but then you may proceed to your regular diet.  Drink plenty of fluids but you should avoid alcoholic beverages for 24 hours.  ACTIVITY:  You should plan to take it easy for the rest of today and you should NOT DRIVE or use heavy machinery until tomorrow (because of the sedation medicines used during the test).     FOLLOW UP: Our staff will call the number listed on your records the next business day following your procedure to check on you and address any questions or concerns that you may have regarding the information given to you following your procedure. If we do not reach you, we will leave a message.  However, if you are feeling well and you are not experiencing any problems, there is no need to return our call.  We will assume that you have returned to your regular daily activities without incident.  If any biopsies were taken you will be contacted by phone or by letter within the next 1-3 weeks.  Please call us at [phone] if you have not heard about the biopsies in 3 weeks.    Await for biopsy results Polyps (handout given) Hemorrhoids (handout given)  SIGNATURES/CONFIDENTIALITY: You and/or your care partner have signed paperwork which will be entered into your electronic medical record.  These signatures attest to the fact that that the information above on your After Visit Summary has been reviewed and is understood.  Full responsibility of the confidentiality of this discharge information lies with you and/or your care-partner. 

## 2016-11-04 NOTE — Op Note (Signed)
[facility] Patient Name: [name] [name] Procedure Date: 11/04/2016 2:24 PM MRN: [id number] Endoscopist: Milus Banister , MD Age: 56 Referring MD:  Date of Birth: [birth year] Gender: Female Account #: [id number] Procedure:                Colonoscopy Indications:              Screening for colorectal malignant neoplasm prior                            to upcoming colostomy takedown. Medicines:                Monitored Anesthesia Care Procedure:                Pre-Anesthesia Assessment:                           - Prior to the procedure, a History and Physical                            was performed, and patient medications and                            allergies were reviewed. The patient's tolerance of                            previous anesthesia was also reviewed. The risks                            and benefits of the procedure and the sedation                            options and risks were discussed with the patient.                            All questions were answered, and informed consent                            was obtained. Prior Anticoagulants: The patient has                            taken no previous anticoagulant or antiplatelet                            agents. ASA Grade Assessment: II - A patient with                            mild systemic disease. After reviewing the risks                            and benefits, the patient was deemed in                            satisfactory condition to undergo the procedure.  After obtaining informed consent, the colonoscope                            was passed under direct vision. Throughout the                            procedure, the patient's blood pressure, pulse, and                            oxygen saturations were monitored continuously. The                            Colonoscope was introduced through the ostomy and                            advanced to the the cecum,  identified by                            appendiceal orifice and ileocecal valve.                            Afterwards, the colonoscope was advance through the                            anus to the proximal end of the Hartman's pouch.                            The colonoscopy was performed without difficulty.                            The patient tolerated the procedure well. The                            quality of the bowel preparation was good. The                            ileocecal valve, appendiceal orifice, and rectum                            were photographed. Scope In: 2:32:02 PM Scope Out: 2:42:49 PM Scope Withdrawal Time: 0 hours 9 minutes 17 seconds  Total Procedure Duration: 0 hours 10 minutes 47 seconds  Findings:                 Via ostomy:                           A 2 mm polyp was found in the sigmoid colon. The                            polyp was sessile. The polyp was removed with a                            cold snare. Resection and retrieval were complete.  There were a few small left sided diverticulum.                           Via anus:                           Typical appearing, mild diversion colitis that was                            not sampled.                           Medium sized internal hemorrhoids.                           The exam was otherwise without abnormality on                            direct and retroflexion views. Complications:            No immediate complications. Estimated blood loss:                            None. Estimated Blood Loss:     Estimated blood loss: none. Impression:               -Via ostomy and via anus:                           - One 2 mm polyp in the sigmoid colon, removed with                            a cold snare. Resected and retrieved.                           - A few small diverticulum remain in the left colon.                           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                           - Medium sized internal hemorrhoids.                           - The examination was otherwise normal on direct                            and retroflexion views. Recommendation:           - Patient has a contact number available for                            emergencies. The signs and symptoms of potential                            delayed complications were discussed with the  patient. Return to normal activities tomorrow.                            Written discharge instructions were provided to the                            patient.                           You will receive a letter within 2-3 weeks with the                            pathology results and my final recommendations.                           If the polyp(s) is proven to be 'pre-cancerous' on                            pathology, you will need repeat colonoscopy in 5                            years. If the polyp(s) is NOT 'precancerous' on                            pathology then you should repeat colon cancer                            screening in 10 years with colonoscopy without need                            for colon cancer screening by any method prior to                            then (including stool testing).                           - Resume previous diet.                           - Continue present medications. Milus Banister, MD 11/04/2016 2:48:30 PM This report has been signed electronically.

## 2016-11-05 ENCOUNTER — Telehealth: Payer: Self-pay
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
## 2016-11-05 NOTE — Telephone Encounter (Signed)
Voicemail full. Unable to leave message

## 2016-11-09 ENCOUNTER — Telehealth: Payer: Self-pay | Admitting: Gastroenterology

## 2016-11-09 NOTE — Telephone Encounter (Signed)
The pt has been advised that the path report has not resulted and we will call her as soon as reviewed.  No infection has been proven, we need the path report to make any further recommendations.

## 2016-11-10 ENCOUNTER — Encounter: Payer: Self-pay | Admitting: Gastroenterology

## 2016-11-20 ENCOUNTER — Ambulatory Visit: Payer: Self-pay | Admitting: General Surgery

## 2016-11-20 NOTE — H&P (Signed)
History of Present Illness [name] Ruff MD; 1/61/0960 12:23 PM) The patient is a 56 year old female presenting for a post-operative visit. 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 She is using her ostomy appliance. Her wound has healed. She is eating good and moving around without difficulty at this point. She is back to her daily activities. She completed her colonoscopy with Dr. Ardis Hughs who found one polyp in her sigmoid colon.   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  Diagnostic Studies History [name] Ruff, MD; 9/56/2130 12:23 PM) Colonoscopy 5-10 years ago Mammogram 1-3 years ago Pap Smear 1-5 years ago  Allergies Mammie Lorenzo, LPN; 8/65/7846 96:29 PM) Accupril *ANTIHYPERTENSIVES* ACE Inhibitors BEE VENOM Dilaudid *ANALGESICS - OPIOID* HYDROmorphone HCl *ANALGESICS - OPIOID* Quinapril-Hydrochlorothiazide *ANTIHYPERTENSIVES* Allergies Reconciled  Medication History [name] Ruff, MD; 07/21/4130 12:23 PM) Claritin (10MG  Tablet, Oral) Active. Vitamin D (Cholecalciferol) (1000UNIT Capsule, Oral) Active. Sertraline HCl (100MG  Tablet, Oral) Active. Pantoprazole Sodium (40MG  Tablet DR, Oral) Active. Triamterene-HCTZ (37.5-25MG  Tablet, Oral) Active. Fluticasone Propionate (50MCG/ACT Suspension, Nasal) Active. ROPINIRole HCl (0.25MG  Tablet, Oral) Active. Maxzide-25 (37.5-25MG  Tablet, Oral) Active. Meloxicam (15MG  Tablet, Oral) Active. Medications Reconciled Neomycin Sulfate (500MG  Tablet, 2 (two) Tablet Oral SEE NOTE, Taken starting 11/20/2016) Active. (TAKE TWO TABLETS AT 2 PM, 3 PM, AND 10 PM THE  DAY PRIOR TO SURGERY) Flagyl (500MG  Tablet, 2 (two) Tablet Oral SEE NOTE, Taken starting 11/20/2016) Active. (Take at 2pm, 3pm, and 10pm the day prior to your colon operation)  Social History [name] Ruff, MD; 4/40/1027 12:23 PM) Alcohol use Occasional alcohol use. Caffeine use Tea. No drug use Tobacco use Never smoker.  Family History [name] Ruff, MD; 2/53/6644 12:23 PM) Cancer Father, Mother. Hypertension Father, Mother. Thyroid problems Mother.  Pregnancy / Birth History [name] Ruff, MD; 0/34/7425 12:23 PM) Age at menarche 72 years. Age of menopause 110-50 Gravida 3 Irregular periods Maternal age 51-25 Para 73  Other Problems [name] Ruff, MD; 9/56/3875 12:23 PM) Anxiety Disorder Cholelithiasis Diverticulosis Gastroesophageal Reflux Disease High blood pressure Kidney Stone     Review of Systems [name] Ruff MD; 6/43/3295 12:23 PM) General Not Present- Appetite Loss, Chills, Fatigue, Fever, Night Sweats, Weight Gain and Weight Loss. Skin Not Present- Change in Wart/Mole, Dryness, Hives, Jaundice, New Lesions, Non-Healing Wounds, Rash and Ulcer. HEENT Not Present- Earache, Hearing Loss, Hoarseness, Nose Bleed, Oral Ulcers, Ringing in the Ears, Seasonal Allergies, Sinus Pain, Sore Throat, Visual Disturbances, Wears glasses/contact lenses and Yellow Eyes. Respiratory Not Present- Bloody sputum, Chronic Cough, Difficulty Breathing, Snoring and Wheezing. Breast Not Present- Breast Mass, Breast Pain, Nipple Discharge and Skin Changes. Gastrointestinal Not Present- Abdominal Pain, Bloating, Bloody Stool, Change in Bowel Habits, Chronic diarrhea, Constipation, Difficulty Swallowing, Excessive gas, Gets full quickly at meals, Hemorrhoids, Indigestion, Nausea, Rectal Pain and Vomiting. Female Genitourinary Not Present- Frequency, Nocturia, Painful Urination, Pelvic Pain and Urgency. Musculoskeletal Not Present- Back Pain, Joint Pain, Joint Stiffness,  Muscle Pain, Muscle Weakness and Swelling of Extremities. Psychiatric Not Present- Anxiety, Bipolar, Change in Sleep Pattern, Depression, Fearful and Frequent crying. Endocrine Not Present- Cold Intolerance, Excessive Hunger, Hair Changes, Heat Intolerance, Hot flashes and New Diabetes. Hematology Not Present- Blood Thinners, Easy Bruising, Excessive bleeding, Gland problems, HIV and Persistent Infections.  Vitals Claiborne Billings Dockery LPN; 1/88/4166  12:03 PM) 11/20/2016 12:03 PM Weight: 235.6 lb Height: 65in Body Surface Area: 2.12 m Body Mass Index: 39.21 kg/m  Temp.: 98.62F(Oral)  Pulse: 76 (Regular)  BP: 128/82 (Sitting, Left Arm, Standard)      Physical Exam [name] Ruff MD; 7/59/1638 12:24 PM)  General Mental Status-Alert. General Appearance-Not in acute distress. Build & Nutrition-Well nourished. Posture-Normal posture. Gait-Normal.  Head and Neck Head-normocephalic, atraumatic with no lesions or palpable masses. Trachea-midline.  Chest and Lung Exam Chest and lung exam reveals -on auscultation, normal breath sounds, no adventitious sounds and normal vocal resonance.  Cardiovascular Cardiovascular examination reveals -normal heart sounds, regular rate and rhythm with no murmurs and no digital clubbing, cyanosis, edema, increased warmth or tenderness.  Abdomen Inspection Inspection of the abdomen reveals - No Hernias. Skin - Scar - Generalized. Colostomy - Left Lower Quadrant. Palpation/Percussion Palpation and Percussion of the abdomen reveal - Soft, Non Tender, No Rigidity (guarding), No hepatosplenomegaly and No Palpable abdominal masses.  Neurologic Neurologic evaluation reveals -alert and oriented x 3 with no impairment of recent or remote memory, normal attention span and ability to concentrate, normal sensation and normal coordination.  Musculoskeletal Normal Exam - Bilateral-Upper Extremity Strength Normal and Lower Extremity  Strength Normal.    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 She has now completely recovered and she has completed her colonoscopy. She is ready for colostomy reversal. We will attempt to do this in a minimally invasive fashion. We have discussed the risks and benefits in detail. I believe she understands this and has agreed to proceed with surgery. The surgery and anatomy were described to the patient as well as the risks of surgery and the possible complications. These include: Bleeding, deep abdominal infections and possible wound complications such as hernia and infection, damage to adjacent structures, leak of surgical connections, which can lead to other surgeries and possibly an ostomy, possible need for other procedures, such as abscess drains in radiology, possible prolonged hospital stay, possible diarrhea from removal of part of the colon, possible constipation from narcotics, possible bowel, bladder or sexual dysfunction if having rectal surgery, prolonged fatigue/weakness or appetite loss, possible early recurrence of of disease, possible complications of their medical problems such as heart disease or arrhythmias or lung problems, death (less than 1%). I believe the patient understands and wishes to proceed with the surgery.

## 2016-12-01 NOTE — Patient Instructions (Addendum)
[name] [name]  12/01/2016   Your procedure is scheduled on: 12-10-16  Report to [facility] Main  Entrance Take [place]  elevators to 3rd floor to  [name] at [phone].   Call this number if you have problems the morning of surgery [phone]    Remember: ONLY 1 PERSON MAY GO WITH YOU TO SHORT STAY TO GET  READY MORNING OF [name].  Do not eat food After Midnight on Tuesday 12-08-16. Please drink plenty of clear liquids the day before surgery on Wednesday 12-09-16 and follow all follow all bowel prep instructions provided by your surgeon. Nothing by mouth after midnight on Wednesday!!     Take these medicines the morning of surgery with A SIP OF WATER: pantoprazole(protonix), sertraline(zoloft) , nasal spray as needed                                You may not have any metal on your body including hair pins and              piercings  Do not wear jewelry, make-up, lotions, powders or perfumes, deodorant             Do not wear nail polish.  Do not shave  48 hours prior to surgery.      Do not bring valuables to the hospital. [place].  Contacts, dentures or bridgework may not be worn into surgery.  Leave your suitcase in the car. You may bring it to your room after surgery.                 Please read over the following fact sheets you were given: _____________________________________________________________________   CLEAR LIQUID DIET   Foods Allowed                                                                     Foods Excluded  Coffee and tea, regular and decaf                             liquids that you cannot  Plain Jell-O in any flavor                                             see through such as: Fruit ices (not with fruit pulp)                                     milk, soups, orange juice  Iced Popsicles                                    All solid food Carbonated beverages,  regular and diet  Cranberry, grape and apple juices Sports drinks like Gatorade Lightly seasoned clear broth or consume(fat free) Sugar, honey syrup  Sample Menu Breakfast                                Lunch                                     Supper Cranberry juice                    Beef broth                            Chicken broth Jell-O                                     Grape juice                           Apple juice Coffee or tea                        Jell-O                                      Popsicle                                                Coffee or tea                        Coffee or tea  _____________________________________________________________________  [facility] - Preparing for Surgery Before surgery, you can play an important role.  Because skin is not sterile, your skin needs to be as free of germs as possible.  You can reduce the number of germs on your skin by washing with CHG (chlorahexidine gluconate) soap before surgery.  CHG is an antiseptic cleaner which kills germs and bonds with the skin to continue killing germs even after washing. Please DO NOT use if you have an allergy to CHG or antibacterial soaps.  If your skin becomes reddened/irritated stop using the CHG and inform your nurse when you arrive at Short Stay. Do not shave (including legs and underarms) for at least 48 hours prior to the first CHG shower.  You may shave your face/neck. Please follow these instructions carefully:  1.  Shower with CHG Soap the night before surgery and the  morning of Surgery.  2.  If you choose to wash your hair, wash your hair first as usual with your  normal  shampoo.  3.  After you shampoo, rinse your hair and body thoroughly to remove the  shampoo.                           4.  Use CHG as you would any other liquid soap.  You can apply chg directly  to the skin and wash  Gently with a scrungie or clean  washcloth.  5.  Apply the CHG Soap to your body ONLY FROM THE NECK DOWN.   Do not use on face/ open                           Wound or open sores. Avoid contact with eyes, ears mouth and genitals (private parts).                       Wash face,  Genitals (private parts) with your normal soap.             6.  Wash thoroughly, paying special attention to the area where your surgery  will be performed.  7.  Thoroughly rinse your body with warm water from the neck down.  8.  DO NOT shower/wash with your normal soap after using and rinsing off  the CHG Soap.                9.  Pat yourself dry with a clean towel.            10.  Wear clean pajamas.            11.  Place clean sheets on your bed the night of your first shower and do not  sleep with pets. Day of Surgery : Do not apply any lotions/deodorants the morning of surgery.  Please wear clean clothes to the hospital/surgery center.  FAILURE TO FOLLOW THESE INSTRUCTIONS MAY RESULT IN THE CANCELLATION OF YOUR SURGERY PATIENT SIGNATURE_________________________________  NURSE SIGNATURE__________________________________  ________________________________________________________________________   [name] [name]  An incentive spirometer is a tool that can help keep your lungs clear and active. This tool measures how well you are filling your lungs with each breath. Taking long deep breaths may help reverse or decrease the chance of developing breathing (pulmonary) problems (especially infection) following:  A long period of time when you are unable to move or be active. BEFORE THE PROCEDURE   If the spirometer includes an indicator to show your best effort, your nurse or respiratory therapist will set it to a desired goal.  If possible, sit up straight or lean slightly forward. Try not to slouch.  Hold the incentive spirometer in an upright position. INSTRUCTIONS FOR USE  1. Sit on the edge of your bed if possible, or sit up as far  as you can in bed or on a chair. 2. Hold the incentive spirometer in an upright position. 3. Breathe out normally. 4. Place the mouthpiece in your mouth and seal your lips tightly around it. 5. Breathe in slowly and as deeply as possible, raising the piston or the ball toward the top of the column. 6. Hold your breath for 3-5 seconds or for as long as possible. Allow the piston or ball to fall to the bottom of the column. 7. Remove the mouthpiece from your mouth and breathe out normally. 8. Rest for a few seconds and repeat Steps 1 through 7 at least 10 times every 1-2 hours when you are awake. Take your time and take a few normal breaths between deep breaths. 9. The spirometer may include an indicator to show your best effort. Use the indicator as a goal to work toward during each repetition. 10. After each set of 10 deep breaths, practice coughing to be sure your lungs are clear. If you have an incision (the cut made at the time of  surgery), support your incision when coughing by placing a pillow or rolled up towels firmly against it. Once you are able to get out of bed, walk around indoors and cough well. You may stop using the incentive spirometer when instructed by your caregiver.  RISKS AND COMPLICATIONS  Take your time so you do not get dizzy or light-headed.  If you are in pain, you may need to take or ask for pain medication before doing incentive spirometry. It is harder to take a deep breath if you are having pain. AFTER USE  Rest and breathe slowly and easily.  It can be helpful to keep track of a log of your progress. Your caregiver can provide you with a simple table to help with this. If you are using the spirometer at home, follow these instructions: [place] IF:   You are having difficultly using the spirometer.  You have trouble using the spirometer as often as instructed.  Your pain medication is not giving enough relief while using the spirometer.  You  develop fever of 100.5 F (38.1 C) or higher. SEEK IMMEDIATE MEDICAL CARE IF:   You cough up bloody sputum that had not been present before.  You develop fever of 102 F (38.9 C) or greater.  You develop worsening pain at or near the incision site. MAKE SURE YOU:   Understand these instructions.  Will watch your condition.  Will get help right away if you are not doing well or get worse. Document Released: 06/22/2006 Document Revised: 05/04/2011 Document Reviewed: 08/23/2006 [facility] Patient Information 2014 [place], [place].   ________________________________________________________________________

## 2016-12-02 ENCOUNTER — Encounter (HOSPITAL_COMMUNITY): Payer: Self-pay

## 2016-12-02 ENCOUNTER — Encounter (HOSPITAL_COMMUNITY)
Admission: RE | Admit: 2016-12-02 | Discharge: 2016-12-02 | Disposition: A | Discharge status: Home / Self Care | Payer: BC Managed Care – PPO | Source: Ambulatory Visit | Attending: General Surgery | Admitting: General Surgery

## 2016-12-02 DIAGNOSIS — R001 Bradycardia, unspecified: Secondary | ICD-10-CM | POA: Insufficient documentation

## 2016-12-02 DIAGNOSIS — Z01810 Encounter for preprocedural cardiovascular examination: Secondary | ICD-10-CM | POA: Diagnosis not present

## 2016-12-02 DIAGNOSIS — Z433 Encounter for attention to colostomy: Secondary | ICD-10-CM | POA: Diagnosis not present

## 2016-12-02 DIAGNOSIS — Z01812 Encounter for preprocedural laboratory examination: Secondary | ICD-10-CM | POA: Diagnosis present

## 2016-12-02 DIAGNOSIS — I10 Essential (primary) hypertension: Secondary | ICD-10-CM | POA: Diagnosis not present

## 2016-12-02 HISTORY — DX: Pneumonia, unspecified organism: J18.9

## 2016-12-02 HISTORY — DX: Personal history of urinary calculi: Z87.442

## 2016-12-02 LAB — CBC
HEMATOCRIT: 39.6 % (ref 36.0–46.0)
HEMOGLOBIN: 12.9 g/dL (ref 12.0–15.0)
MCH: 28.9 pg (ref 26.0–34.0)
MCHC: 32.6 g/dL (ref 30.0–36.0)
MCV: 88.6 fL (ref 78.0–100.0)
Platelets: 266 10*3/uL (ref 150–400)
RBC: 4.47 MIL/uL (ref 3.87–5.11)
RDW: 14.7 % (ref 11.5–15.5)
WBC: 7 10*3/uL (ref 4.0–10.5)

## 2016-12-02 LAB — BASIC METABOLIC PANEL
ANION GAP: 11 (ref 5–15)
BUN: 23 mg/dL — AB (ref 6–20)
CHLORIDE: 102 mmol/L (ref 101–111)
CO2: 28 mmol/L (ref 22–32)
Calcium: 9.8 mg/dL (ref 8.9–10.3)
Creatinine, Ser: 0.95 mg/dL (ref 0.44–1.00)
GFR calc Af Amer: >60 mL/min (ref >60)
Glucose, Bld: 86 mg/dL (ref 65–99)
POTASSIUM: 4.1 mmol/L (ref 3.5–5.1)
SODIUM: 141 mmol/L (ref 135–145)

## 2016-12-10 ENCOUNTER — Inpatient Hospital Stay (HOSPITAL_COMMUNITY): Payer: BC Managed Care – PPO | Admitting: Anesthesiology

## 2016-12-10 ENCOUNTER — Encounter (HOSPITAL_COMMUNITY): Payer: Self-pay | Admitting: *Deleted

## 2016-12-10 ENCOUNTER — Inpatient Hospital Stay (HOSPITAL_COMMUNITY)
Admission: RE | Admit: 2016-12-10 | Discharge: 2016-12-13 | DRG: 331 | Disposition: A | Discharge status: Home / Self Care | Payer: BC Managed Care – PPO | Source: Ambulatory Visit | Attending: General Surgery | Admitting: General Surgery

## 2016-12-10 ENCOUNTER — Encounter (HOSPITAL_COMMUNITY)
Admission: RE | Disposition: A | Discharge status: Home / Self Care | Payer: Self-pay | Source: Ambulatory Visit | Attending: General Surgery

## 2016-12-10 DIAGNOSIS — Z79899 Other long term (current) drug therapy: Secondary | ICD-10-CM

## 2016-12-10 DIAGNOSIS — Z96659 Presence of unspecified artificial knee joint: Secondary | ICD-10-CM

## 2016-12-10 DIAGNOSIS — E669 Obesity, unspecified: Secondary | ICD-10-CM | POA: Diagnosis present

## 2016-12-10 DIAGNOSIS — Z6839 Body mass index (BMI) 39.0-39.9, adult: Secondary | ICD-10-CM

## 2016-12-10 DIAGNOSIS — Z433 Encounter for attention to colostomy: Principal | ICD-10-CM

## 2016-12-10 DIAGNOSIS — D500 Iron deficiency anemia secondary to blood loss (chronic): Secondary | ICD-10-CM | POA: Diagnosis present

## 2016-12-10 DIAGNOSIS — I10 Essential (primary) hypertension: Secondary | ICD-10-CM | POA: Diagnosis present

## 2016-12-10 DIAGNOSIS — J310 Chronic rhinitis: Secondary | ICD-10-CM | POA: Diagnosis present

## 2016-12-10 DIAGNOSIS — K5732 Diverticulitis of large intestine without perforation or abscess without bleeding: Secondary | ICD-10-CM

## 2016-12-10 DIAGNOSIS — K219 Gastro-esophageal reflux disease without esophagitis: Secondary | ICD-10-CM | POA: Diagnosis present

## 2016-12-10 DIAGNOSIS — K5780 Diverticulitis of intestine, part unspecified, with perforation and abscess without bleeding: Secondary | ICD-10-CM | POA: Diagnosis present

## 2016-12-10 DIAGNOSIS — F419 Anxiety disorder, unspecified: Secondary | ICD-10-CM | POA: Diagnosis present

## 2016-12-10 DIAGNOSIS — K660 Peritoneal adhesions (postprocedural) (postinfection): Secondary | ICD-10-CM | POA: Diagnosis present

## 2016-12-10 DIAGNOSIS — E785 Hyperlipidemia, unspecified: Secondary | ICD-10-CM | POA: Diagnosis present

## 2016-12-10 DIAGNOSIS — Z933 Colostomy status: Secondary | ICD-10-CM

## 2016-12-10 HISTORY — DX: Chronic rhinitis: J31.0

## 2016-12-10 HISTORY — PX: COLOSTOMY TAKEDOWN: SHX5258

## 2016-12-10 HISTORY — DX: Diverticulitis of intestine, part unspecified, with perforation and abscess without bleeding: K57.80

## 2016-12-10 HISTORY — DX: Presence of unspecified artificial knee joint: Z96.659

## 2016-12-10 SURGERY — CLOSURE, COLOSTOMY, LAPAROSCOPIC
Anesthesia: General

## 2016-12-10 MED ORDER — EPHEDRINE 5 MG/ML INJ
INTRAVENOUS | Status: AC
  Filled 2016-12-10: qty 10

## 2016-12-10 MED ORDER — PROPOFOL 10 MG/ML IV BOLUS
INTRAVENOUS | Status: DC | PRN
  Administered 2016-12-10: 10 mg via INTRAVENOUS
  Administered 2016-12-10: 150 mg via INTRAVENOUS

## 2016-12-10 MED ORDER — ONDANSETRON HCL 4 MG/2ML IJ SOLN
INTRAMUSCULAR | Status: AC
  Filled 2016-12-10: qty 2

## 2016-12-10 MED ORDER — LIDOCAINE 2% (20 MG/ML) 5 ML SYRINGE
INTRAMUSCULAR | Status: DC | PRN
  Administered 2016-12-10: 60 mg via INTRAVENOUS

## 2016-12-10 MED ORDER — SUGAMMADEX SODIUM 200 MG/2ML IV SOLN
INTRAVENOUS | Status: DC | PRN
  Administered 2016-12-10: 200 mg via INTRAVENOUS

## 2016-12-10 MED ORDER — GLYCOPYRROLATE 0.2 MG/ML IV SOSY
PREFILLED_SYRINGE | INTRAVENOUS | Status: DC | PRN
  Administered 2016-12-10: .2 mg via INTRAVENOUS

## 2016-12-10 MED ORDER — ENOXAPARIN SODIUM 40 MG/0.4ML ~~LOC~~ SOLN
40.0000 mg | SUBCUTANEOUS | Status: DC
  Administered 2016-12-11 – 2016-12-13 (×3): 40 mg via SUBCUTANEOUS
  Filled 2016-12-10 (×3): qty 0.4

## 2016-12-10 MED ORDER — ROCURONIUM BROMIDE 10 MG/ML (PF) SYRINGE
PREFILLED_SYRINGE | INTRAVENOUS | Status: DC | PRN
  Administered 2016-12-10: 10 mg via INTRAVENOUS
  Administered 2016-12-10: 20 mg via INTRAVENOUS
  Administered 2016-12-10: 40 mg via INTRAVENOUS

## 2016-12-10 MED ORDER — PANTOPRAZOLE SODIUM 40 MG PO TBEC
40.0000 mg | DELAYED_RELEASE_TABLET | Freq: Every day | ORAL | Status: DC
  Administered 2016-12-11 – 2016-12-12 (×2): 40 mg via ORAL
  Filled 2016-12-10 (×3): qty 1

## 2016-12-10 MED ORDER — ROPINIROLE HCL 0.25 MG PO TABS
0.2500 mg | ORAL_TABLET | Freq: Every day | ORAL | Status: DC
  Administered 2016-12-10 – 2016-12-12 (×3): 0.25 mg via ORAL
  Filled 2016-12-10 (×3): qty 1

## 2016-12-10 MED ORDER — PROPOFOL 10 MG/ML IV BOLUS
INTRAVENOUS | Status: AC
  Filled 2016-12-10: qty 20

## 2016-12-10 MED ORDER — PROMETHAZINE HCL 25 MG/ML IJ SOLN
6.2500 mg | INTRAMUSCULAR | Status: DC | PRN
  Administered 2016-12-10: 6.25 mg via INTRAVENOUS

## 2016-12-10 MED ORDER — EPHEDRINE SULFATE-NACL 50-0.9 MG/10ML-% IV SOSY
PREFILLED_SYRINGE | INTRAVENOUS | Status: DC | PRN
  Administered 2016-12-10 (×2): 10 mg via INTRAVENOUS
  Administered 2016-12-10 (×2): 15 mg via INTRAVENOUS
  Administered 2016-12-10: 10 mg via INTRAVENOUS

## 2016-12-10 MED ORDER — BUPIVACAINE-EPINEPHRINE 0.25% -1:200000 IJ SOLN
INTRAMUSCULAR | Status: DC | PRN
  Administered 2016-12-10: 50 mL

## 2016-12-10 MED ORDER — BUPIVACAINE LIPOSOME 1.3 % IJ SUSP
20.0000 mL | INTRAMUSCULAR | Status: DC
  Filled 2016-12-10: qty 20

## 2016-12-10 MED ORDER — ACETAMINOPHEN 500 MG PO TABS
1000.0000 mg | ORAL_TABLET | Freq: Four times a day (QID) | ORAL | Status: DC
  Administered 2016-12-10 – 2016-12-13 (×9): 1000 mg via ORAL
  Filled 2016-12-10 (×12): qty 2

## 2016-12-10 MED ORDER — ALUM & MAG HYDROXIDE-SIMETH 200-200-20 MG/5ML PO SUSP: 30.0000 mL | Freq: Four times a day (QID) | ORAL | Status: DC | PRN

## 2016-12-10 MED ORDER — DIPHENHYDRAMINE HCL 12.5 MG/5ML PO ELIX: 12.5000 mg | ORAL_SOLUTION | Freq: Four times a day (QID) | ORAL | Status: DC | PRN

## 2016-12-10 MED ORDER — SACCHAROMYCES BOULARDII 250 MG PO CAPS
250.0000 mg | ORAL_CAPSULE | Freq: Two times a day (BID) | ORAL | Status: DC
  Administered 2016-12-10 – 2016-12-12 (×5): 250 mg via ORAL
  Filled 2016-12-10 (×5): qty 1

## 2016-12-10 MED ORDER — DIPHENHYDRAMINE HCL 50 MG/ML IJ SOLN: 12.5000 mg | Freq: Four times a day (QID) | INTRAMUSCULAR | Status: DC | PRN

## 2016-12-10 MED ORDER — SUCCINYLCHOLINE CHLORIDE 20 MG/ML IJ SOLN
INTRAMUSCULAR | Status: DC | PRN
  Administered 2016-12-10: 100 mg via INTRAVENOUS

## 2016-12-10 MED ORDER — FENTANYL CITRATE (PF) 250 MCG/5ML IJ SOLN
INTRAMUSCULAR | Status: AC
  Filled 2016-12-10: qty 5

## 2016-12-10 MED ORDER — DEXAMETHASONE SODIUM PHOSPHATE 10 MG/ML IJ SOLN
INTRAMUSCULAR | Status: DC | PRN
  Administered 2016-12-10: 10 mg via INTRAVENOUS

## 2016-12-10 MED ORDER — ALVIMOPAN 12 MG PO CAPS
12.0000 mg | ORAL_CAPSULE | Freq: Two times a day (BID) | ORAL | Status: DC
  Administered 2016-12-11 – 2016-12-12 (×3): 12 mg via ORAL
  Filled 2016-12-10 (×3): qty 1

## 2016-12-10 MED ORDER — MIDAZOLAM HCL 5 MG/5ML IJ SOLN
INTRAMUSCULAR | Status: DC | PRN
  Administered 2016-12-10: 2 mg via INTRAVENOUS

## 2016-12-10 MED ORDER — KCL IN DEXTROSE-NACL 20-5-0.45 MEQ/L-%-% IV SOLN
INTRAVENOUS | Status: DC
  Administered 2016-12-10 – 2016-12-12 (×3): via INTRAVENOUS
  Filled 2016-12-10 (×4): qty 1000

## 2016-12-10 MED ORDER — LACTATED RINGERS IV SOLN
INTRAVENOUS | Status: DC
  Administered 2016-12-10: 09:00:00 via INTRAVENOUS

## 2016-12-10 MED ORDER — TRIAMTERENE-HCTZ 37.5-25 MG PO TABS
1.0000 | ORAL_TABLET | Freq: Every day | ORAL | Status: DC
  Administered 2016-12-12: 1 via ORAL
  Filled 2016-12-10 (×2): qty 1

## 2016-12-10 MED ORDER — ACETAMINOPHEN 500 MG PO TABS
1000.0000 mg | ORAL_TABLET | ORAL | Status: AC
  Administered 2016-12-10: 1000 mg via ORAL
  Filled 2016-12-10: qty 2

## 2016-12-10 MED ORDER — GABAPENTIN 300 MG PO CAPS
300.0000 mg | ORAL_CAPSULE | ORAL | Status: AC
  Administered 2016-12-10: 300 mg via ORAL
  Filled 2016-12-10: qty 1

## 2016-12-10 MED ORDER — DEXTROSE 5 % IV SOLN
2.0000 g | Freq: Two times a day (BID) | INTRAVENOUS | Status: AC
  Administered 2016-12-10: 2 g via INTRAVENOUS
  Filled 2016-12-10: qty 2

## 2016-12-10 MED ORDER — HYDROMORPHONE HCL 1 MG/ML IJ SOLN: 0.5000 mg | INTRAMUSCULAR | Status: DC | PRN

## 2016-12-10 MED ORDER — FENTANYL CITRATE (PF) 100 MCG/2ML IJ SOLN
INTRAMUSCULAR | Status: DC | PRN
  Administered 2016-12-10: 50 ug via INTRAVENOUS
  Administered 2016-12-10: 100 ug via INTRAVENOUS
  Administered 2016-12-10: 50 ug via INTRAVENOUS

## 2016-12-10 MED ORDER — PHENYLEPHRINE 40 MCG/ML (10ML) SYRINGE FOR IV PUSH (FOR BLOOD PRESSURE SUPPORT)
PREFILLED_SYRINGE | INTRAVENOUS | Status: DC | PRN
  Administered 2016-12-10: 80 ug via INTRAVENOUS
  Administered 2016-12-10: 100 ug via INTRAVENOUS

## 2016-12-10 MED ORDER — ONDANSETRON HCL 4 MG/2ML IJ SOLN: 4.0000 mg | Freq: Four times a day (QID) | INTRAMUSCULAR | Status: DC | PRN

## 2016-12-10 MED ORDER — ALVIMOPAN 12 MG PO CAPS
12.0000 mg | ORAL_CAPSULE | ORAL | Status: AC
  Administered 2016-12-10: 12 mg via ORAL
  Filled 2016-12-10: qty 1

## 2016-12-10 MED ORDER — TRAMADOL HCL 50 MG PO TABS
50.0000 mg | ORAL_TABLET | Freq: Four times a day (QID) | ORAL | Status: DC | PRN
  Administered 2016-12-11: 50 mg via ORAL
  Administered 2016-12-11 – 2016-12-13 (×3): 100 mg via ORAL
  Filled 2016-12-10: qty 2
  Filled 2016-12-10: qty 1
  Filled 2016-12-10 (×2): qty 2

## 2016-12-10 MED ORDER — 0.9 % SODIUM CHLORIDE (POUR BTL) OPTIME
TOPICAL | Status: DC | PRN
  Administered 2016-12-10: 2000 mL

## 2016-12-10 MED ORDER — BUPIVACAINE-EPINEPHRINE 0.25% -1:200000 IJ SOLN
INTRAMUSCULAR | Status: AC
  Filled 2016-12-10: qty 1

## 2016-12-10 MED ORDER — GABAPENTIN 300 MG PO CAPS
300.0000 mg | ORAL_CAPSULE | Freq: Two times a day (BID) | ORAL | Status: DC
  Administered 2016-12-10 – 2016-12-12 (×5): 300 mg via ORAL
  Filled 2016-12-10 (×5): qty 1

## 2016-12-10 MED ORDER — CEFOTETAN DISODIUM-DEXTROSE 2-2.08 GM-%(50ML) IV SOLR
2.0000 g | INTRAVENOUS | Status: AC
  Administered 2016-12-10: 2 g via INTRAVENOUS
  Filled 2016-12-10: qty 50

## 2016-12-10 MED ORDER — ONDANSETRON HCL 4 MG PO TABS: 4.0000 mg | ORAL_TABLET | Freq: Four times a day (QID) | ORAL | Status: DC | PRN

## 2016-12-10 MED ORDER — ONDANSETRON HCL 4 MG/2ML IJ SOLN
INTRAMUSCULAR | Status: DC | PRN
  Administered 2016-12-10: 4 mg via INTRAVENOUS

## 2016-12-10 MED ORDER — LACTATED RINGERS IR SOLN
Status: DC | PRN
  Administered 2016-12-10: 1000 mL

## 2016-12-10 MED ORDER — PROMETHAZINE HCL 25 MG/ML IJ SOLN
INTRAMUSCULAR | Status: AC
  Filled 2016-12-10: qty 1

## 2016-12-10 MED ORDER — MORPHINE SULFATE (PF) 4 MG/ML IV SOLN: 1.0000 mg | INTRAVENOUS | Status: DC | PRN

## 2016-12-10 MED ORDER — MIDAZOLAM HCL 2 MG/2ML IJ SOLN
INTRAMUSCULAR | Status: AC
  Filled 2016-12-10: qty 2

## 2016-12-10 MED ORDER — BUPIVACAINE LIPOSOME 1.3 % IJ SUSP
20.0000 mL | Freq: Once | INTRAMUSCULAR | Status: AC
  Administered 2016-12-10: 20 mL
  Filled 2016-12-10: qty 20

## 2016-12-10 MED ORDER — HYDROMORPHONE HCL-NACL 0.5-0.9 MG/ML-% IV SOSY: 0.2500 mg | PREFILLED_SYRINGE | INTRAVENOUS | Status: DC | PRN

## 2016-12-10 MED ORDER — SERTRALINE HCL 100 MG PO TABS
100.0000 mg | ORAL_TABLET | Freq: Every day | ORAL | Status: DC
  Administered 2016-12-11 – 2016-12-12 (×2): 100 mg via ORAL
  Filled 2016-12-10 (×3): qty 1

## 2016-12-10 MED ORDER — HEPARIN SODIUM (PORCINE) 5000 UNIT/ML IJ SOLN
5000.0000 [IU] | Freq: Once | INTRAMUSCULAR | Status: AC
  Administered 2016-12-10: 5000 [IU] via SUBCUTANEOUS
  Filled 2016-12-10: qty 1

## 2016-12-10 SURGICAL SUPPLY — 81 items
ADH SKN CLS APL DERMABOND .7 (GAUZE/BANDAGES/DRESSINGS) ×1
APPLIER CLIP 5 13 M/L LIGAMAX5 (MISCELLANEOUS)
APR CLP MED LRG 5 ANG JAW (MISCELLANEOUS)
BLADE EXTENDED COATED 6.5IN (ELECTRODE) IMPLANT
CABLE HIGH FREQUENCY MONO STRZ (ELECTRODE) ×3 IMPLANT
CELLS DAT CNTRL 66122 CELL SVR (MISCELLANEOUS) IMPLANT
CHLORAPREP W/TINT 26ML (MISCELLANEOUS) ×3 IMPLANT
CLIP APPLIE 5 13 M/L LIGAMAX5 (MISCELLANEOUS) IMPLANT
DECANTER SPIKE VIAL GLASS SM (MISCELLANEOUS) ×3 IMPLANT
DERMABOND ADVANCED (GAUZE/BANDAGES/DRESSINGS) ×2
DERMABOND ADVANCED .7 DNX12 (GAUZE/BANDAGES/DRESSINGS) ×1 IMPLANT
DRAIN CHANNEL 19F RND (DRAIN) IMPLANT
DRAPE LAPAROSCOPIC ABDOMINAL (DRAPES) ×3 IMPLANT
DRAPE SURG IRRIG POUCH 19X23 (DRAPES) ×3 IMPLANT
DRESSING TELFA ISLAND 4X8 (GAUZE/BANDAGES/DRESSINGS) ×2 IMPLANT
DRSG OPSITE POSTOP 4X10 (GAUZE/BANDAGES/DRESSINGS) IMPLANT
DRSG OPSITE POSTOP 4X6 (GAUZE/BANDAGES/DRESSINGS) IMPLANT
DRSG OPSITE POSTOP 4X8 (GAUZE/BANDAGES/DRESSINGS) IMPLANT
DRSG TELFA 3X8 NADH (GAUZE/BANDAGES/DRESSINGS) ×3 IMPLANT
ELECT PENCIL ROCKER SW 15FT (MISCELLANEOUS) ×6 IMPLANT
ELECT REM PT RETURN 15FT ADLT (MISCELLANEOUS) ×3 IMPLANT
EVACUATOR SILICONE 100CC (DRAIN) IMPLANT
GAUZE SPONGE 4X4 12PLY STRL (GAUZE/BANDAGES/DRESSINGS) ×2 IMPLANT
GLOVE BIO SURGEON STRL SZ 6.5 (GLOVE) ×4 IMPLANT
GLOVE BIO SURGEONS STRL SZ 6.5 (GLOVE) ×2
GLOVE BIOGEL PI IND STRL 7.0 (GLOVE) ×2 IMPLANT
GLOVE BIOGEL PI INDICATOR 7.0 (GLOVE) ×4
GOWN STRL REUS W/TWL 2XL LVL3 (GOWN DISPOSABLE) ×6 IMPLANT
GOWN STRL REUS W/TWL XL LVL3 (GOWN DISPOSABLE) ×12 IMPLANT
GRASPER ENDOPATH ANVIL 10MM (MISCELLANEOUS) IMPLANT
HANDLE STAPLE EGIA 4 XL (STAPLE) ×2 IMPLANT
HOLDER FOLEY CATH W/STRAP (MISCELLANEOUS) ×3 IMPLANT
IRRIG SUCT STRYKERFLOW 2 WTIP (MISCELLANEOUS) ×3
IRRIGATION SUCT STRKRFLW 2 WTP (MISCELLANEOUS) ×1 IMPLANT
LUBRICANT JELLY K Y 4OZ (MISCELLANEOUS) ×3 IMPLANT
NDL INSUFFLATION 14GA 120MM (NEEDLE) IMPLANT
NEEDLE INSUFFLATION 14GA 120MM (NEEDLE) ×3 IMPLANT
PACK COLON (CUSTOM PROCEDURE TRAY) ×3 IMPLANT
PAD DRESSING TELFA 3X8 NADH (GAUZE/BANDAGES/DRESSINGS) IMPLANT
PAD POSITIONING PINK XL (MISCELLANEOUS) ×3 IMPLANT
PORT LAP GEL ALEXIS MED 5-9CM (MISCELLANEOUS) ×3 IMPLANT
POSITIONER SURGICAL ARM (MISCELLANEOUS) ×3 IMPLANT
RELOAD EGIA 45 MED/THCK PURPLE (STAPLE) ×2 IMPLANT
RELOAD EGIA 60 MED/THCK PURPLE (STAPLE) ×3 IMPLANT
RELOAD STAPLE 60 MED/THCK ART (STAPLE) IMPLANT
RETRACTOR WND ALEXIS 18 MED (MISCELLANEOUS) IMPLANT
RTRCTR WOUND ALEXIS 18CM MED (MISCELLANEOUS)
SCISSORS LAP 5X35 DISP (ENDOMECHANICALS) ×3 IMPLANT
SEALER TISSUE G2 STRG ARTC 35C (ENDOMECHANICALS) IMPLANT
SEALER TISSUE X1 CVD JAW (INSTRUMENTS) IMPLANT
SLEEVE SURGEON STRL (DRAPES) ×2 IMPLANT
SLEEVE XCEL OPT CAN 5 100 (ENDOMECHANICALS) ×5 IMPLANT
SPONGE DRAIN TRACH 4X4 STRL 2S (GAUZE/BANDAGES/DRESSINGS) IMPLANT
SPONGE LAP 18X18 X RAY DECT (DISPOSABLE) IMPLANT
STAPLER CIRC CVD 29MM 37CM (STAPLE) ×2 IMPLANT
STAPLER VISISTAT 35W (STAPLE) IMPLANT
SUT ETHILON 2 0 PS N (SUTURE) IMPLANT
SUT NOVA NAB DX-16 0-1 5-0 T12 (SUTURE) ×4 IMPLANT
SUT NOVA NAB GS-21 0 18 T12 DT (SUTURE) ×6 IMPLANT
SUT PDS AB 1 CTX 36 (SUTURE) IMPLANT
SUT PDS AB 1 TP1 96 (SUTURE) IMPLANT
SUT PROLENE 2 0 KS (SUTURE) ×3 IMPLANT
SUT SILK 2 0 (SUTURE) ×3
SUT SILK 2 0 SH CR/8 (SUTURE) ×3 IMPLANT
SUT SILK 2-0 18XBRD TIE 12 (SUTURE) ×1 IMPLANT
SUT SILK 3 0 (SUTURE) ×3
SUT SILK 3 0 SH CR/8 (SUTURE) ×3 IMPLANT
SUT SILK 3-0 18XBRD TIE 12 (SUTURE) ×1 IMPLANT
SUT VIC AB 2-0 SH 18 (SUTURE) ×3 IMPLANT
SUT VIC AB 2-0 SH 27 (SUTURE) ×6
SUT VIC AB 2-0 SH 27X BRD (SUTURE) IMPLANT
SUT VIC AB 4-0 PS2 27 (SUTURE) ×3 IMPLANT
SYS LAPSCP GELPORT 120MM (MISCELLANEOUS)
SYSTEM LAPSCP GELPORT 120MM (MISCELLANEOUS) IMPLANT
TOWEL OR NON WOVEN STRL DISP B (DISPOSABLE) ×3 IMPLANT
TRAY FOLEY CATH SILVER 14FR (SET/KITS/TRAYS/PACK) ×2 IMPLANT
TROCAR BLADELESS OPT 5 100 (ENDOMECHANICALS) ×3 IMPLANT
TROCAR XCEL BLUNT TIP 100MML (ENDOMECHANICALS) IMPLANT
TUBING CONNECTING 10 (TUBING) ×2 IMPLANT
TUBING CONNECTING 10' (TUBING) ×1
TUBING INSUF HEATED (TUBING) ×3 IMPLANT

## 2016-12-10 NOTE — Anesthesia Postprocedure Evaluation (Signed)
Anesthesia Post Note  Patient: [name] [name]  Procedure(s) Performed: LAPAROSCOPIC COLOSTOMY REVERSAL (N/A )     Patient location during evaluation: PACU Anesthesia Type: General Level of consciousness: awake and alert Pain management: pain level controlled Vital Signs Assessment: post-procedure vital signs reviewed and stable Respiratory status: spontaneous breathing, nonlabored ventilation, respiratory function stable and patient connected to nasal cannula oxygen Cardiovascular status: blood pressure returned to baseline and stable Postop Assessment: no apparent nausea or vomiting Anesthetic complications: no    Last Vitals:  Vitals:   12/10/16 1515 12/10/16 1530  BP: 124/81 124/82  Pulse: 76 79  Resp: 17 16  Temp:  36.7 C  SpO2: 98% 98%    Last Pain:  Vitals:   12/10/16 1515  TempSrc:   PainSc: 0-No pain                 Estelle Greenleaf,W. EDMOND

## 2016-12-10 NOTE — Anesthesia Procedure Notes (Signed)

## 2016-12-10 NOTE — H&P (View-Only) (Signed)
History of Present Illness [name] Ruff MD; 4/00/8676 12:23 PM) The patient is a 56 year old female presenting for a post-operative visit. 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 She is using her ostomy appliance. Her wound has healed. She is eating good and moving around without difficulty at this point. She is back to her daily activities. She completed her colonoscopy with Dr. Ardis Hughs who found one polyp in her sigmoid colon.   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  Diagnostic Studies History [name] Ruff, MD; 0/99/8338 12:23 PM) Colonoscopy 5-10 years ago Mammogram 1-3 years ago Pap Smear 1-5 years ago  Allergies [name] Lorenzo, LPN; 2/50/5397 67:34 PM) Accupril *ANTIHYPERTENSIVES* ACE Inhibitors BEE VENOM Dilaudid *ANALGESICS - OPIOID* HYDROmorphone HCl *ANALGESICS - OPIOID* Quinapril-Hydrochlorothiazide *ANTIHYPERTENSIVES* Allergies Reconciled  Medication History [name] Ruff, MD; 1/93/7902 12:23 PM) Claritin (10MG  Tablet, Oral) Active. Vitamin D (Cholecalciferol) (1000UNIT Capsule, Oral) Active. Sertraline HCl (100MG  Tablet, Oral) Active. Pantoprazole Sodium (40MG  Tablet DR, Oral) Active. Triamterene-HCTZ (37.5-25MG  Tablet, Oral) Active. Fluticasone Propionate (50MCG/ACT Suspension, Nasal) Active. ROPINIRole HCl (0.25MG  Tablet, Oral) Active. Maxzide-25 (37.5-25MG  Tablet, Oral) Active. Meloxicam (15MG  Tablet, Oral) Active. Medications Reconciled Neomycin Sulfate (500MG  Tablet, 2 (two) Tablet Oral SEE NOTE, Taken starting 11/20/2016) Active. (TAKE TWO TABLETS AT 2 PM, 3 PM, AND 10 PM THE  DAY PRIOR TO SURGERY) Flagyl (500MG  Tablet, 2 (two) Tablet Oral SEE NOTE, Taken starting 11/20/2016) Active. (Take at 2pm, 3pm, and 10pm the day prior to your colon operation)  Social History [name] Ruff, MD; 06/02/7351 12:23 PM) Alcohol use Occasional alcohol use. Caffeine use Tea. No drug use Tobacco use Never smoker.  Family History [name] Ruff, MD; 2/99/2426 12:23 PM) Cancer Father, Mother. Hypertension Father, Mother. Thyroid problems Mother.  Pregnancy / Birth History [name] Ruff, MD; 8/34/1963 12:23 PM) Age at menarche 79 years. Age of menopause 82-50 Gravida 3 Irregular periods Maternal age 62-25 Para 59  Other Problems [name] Ruff, MD; 2/29/7989 12:23 PM) Anxiety Disorder Cholelithiasis Diverticulosis Gastroesophageal Reflux Disease High blood pressure Kidney Stone     Review of Systems [name] Ruff MD; 04/06/9415 12:23 PM) General Not Present- Appetite Loss, Chills, Fatigue, Fever, Night Sweats, Weight Gain and Weight Loss. Skin Not Present- Change in Wart/Mole, Dryness, Hives, Jaundice, New Lesions, Non-Healing Wounds, Rash and Ulcer. HEENT Not Present- Earache, Hearing Loss, Hoarseness, Nose Bleed, Oral Ulcers, Ringing in the Ears, Seasonal Allergies, Sinus Pain, Sore Throat, Visual Disturbances, Wears glasses/contact lenses and Yellow Eyes. Respiratory Not Present- Bloody sputum, Chronic Cough, Difficulty Breathing, Snoring and Wheezing. Breast Not Present- Breast Mass, Breast Pain, Nipple Discharge and Skin Changes. Gastrointestinal Not Present- Abdominal Pain, Bloating, Bloody Stool, Change in Bowel Habits, Chronic diarrhea, Constipation, Difficulty Swallowing, Excessive gas, Gets full quickly at meals, Hemorrhoids, Indigestion, Nausea, Rectal Pain and Vomiting. Female Genitourinary Not Present- Frequency, Nocturia, Painful Urination, Pelvic Pain and Urgency. Musculoskeletal Not Present- Back Pain, Joint Pain, Joint Stiffness,  Muscle Pain, Muscle Weakness and Swelling of Extremities. Psychiatric Not Present- Anxiety, Bipolar, Change in Sleep Pattern, Depression, Fearful and Frequent crying. Endocrine Not Present- Cold Intolerance, Excessive Hunger, Hair Changes, Heat Intolerance, Hot flashes and New Diabetes. Hematology Not Present- Blood Thinners, Easy Bruising, Excessive bleeding, Gland problems, HIV and Persistent Infections.  Vitals [name] Billings Dockery LPN; 06/01/1446  12:03 PM) 11/20/2016 12:03 PM Weight: 235.6 lb Height: 65in Body Surface Area: 2.12 m Body Mass Index: 39.21 kg/m  Temp.: 98.6F(Oral)  Pulse: 76 (Regular)  BP: 128/82 (Sitting, Left Arm, Standard)      Physical Exam [name] Ruff MD; 1/96/2229 12:24 PM)  General Mental Status-Alert. General Appearance-Not in acute distress. Build & Nutrition-Well nourished. Posture-Normal posture. Gait-Normal.  Head and Neck Head-normocephalic, atraumatic with no lesions or palpable masses. Trachea-midline.  Chest and Lung Exam Chest and lung exam reveals -on auscultation, normal breath sounds, no adventitious sounds and normal vocal resonance.  Cardiovascular Cardiovascular examination reveals -normal heart sounds, regular rate and rhythm with no murmurs and no digital clubbing, cyanosis, edema, increased warmth or tenderness.  Abdomen Inspection Inspection of the abdomen reveals - No Hernias. Skin - Scar - Generalized. Colostomy - Left Lower Quadrant. Palpation/Percussion Palpation and Percussion of the abdomen reveal - Soft, Non Tender, No Rigidity (guarding), No hepatosplenomegaly and No Palpable abdominal masses.  Neurologic Neurologic evaluation reveals -alert and oriented x 3 with no impairment of recent or remote memory, normal attention span and ability to concentrate, normal sensation and normal coordination.  Musculoskeletal Normal Exam - Bilateral-Upper Extremity Strength Normal and Lower Extremity  Strength Normal.    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 She has now completely recovered and she has completed her colonoscopy. She is ready for colostomy reversal. We will attempt to do this in a minimally invasive fashion. We have discussed the risks and benefits in detail. I believe she understands this and has agreed to proceed with surgery. The surgery and anatomy were described to the patient as well as the risks of surgery and the possible complications. These include: Bleeding, deep abdominal infections and possible wound complications such as hernia and infection, damage to adjacent structures, leak of surgical connections, which can lead to other surgeries and possibly an ostomy, possible need for other procedures, such as abscess drains in radiology, possible prolonged hospital stay, possible diarrhea from removal of part of the colon, possible constipation from narcotics, possible bowel, bladder or sexual dysfunction if having rectal surgery, prolonged fatigue/weakness or appetite loss, possible early recurrence of of disease, possible complications of their medical problems such as heart disease or arrhythmias or lung problems, death (less than 1%). I believe the patient understands and wishes to proceed with the surgery.

## 2016-12-10 NOTE — Op Note (Signed)
12/10/2016  3:37 PM  PATIENT:  [name] [name]  56 y.o. female  Patient Care Team: Shon Baton, MD as PCP - General (Internal Medicine)  PRE-OPERATIVE DIAGNOSIS:  COLOSTOMY PRESENT  POST-OPERATIVE DIAGNOSIS:  COLOSTOMY PRESENT  PROCEDURE:   LAPAROSCOPIC COLOSTOMY REVERSAL  SURGEON:  Surgeon(s): Leighton Ruff, MD Armandina Gemma, MD  ASSISTANT: Dr Harlow Asa   ANESTHESIA:   local and general  EBL: 132ml  Total I/O In: -  Out: 150 [Urine:50; Blood:100]  Delay start of Pharmacological VTE agent (>24hrs) due to surgical blood loss or risk of bleeding:  no  DRAINS: none   SPECIMEN:  Source of Specimen:  Distal sigmoid colon, colostomy  DISPOSITION OF SPECIMEN:  PATHOLOGY  COUNTS:  YES  PLAN OF CARE: Admit to inpatient   PATIENT DISPOSITION:  PACU - hemodynamically stable.  INDICATION:    56 y.o. F 6 mo s/p Hartman's procedure for perforated diverticulitis.  I recommended segmental resection:  The anatomy & physiology of the digestive tract was discussed.  The pathophysiology was discussed.  Natural history risks without surgery was discussed.   I worked to give an overview of the disease and the frequent need to have multispecialty involvement.  I feel the risks of no intervention will lead to serious problems that outweigh the operative risks; therefore, I recommended a partial colectomy to remove the pathology.  Laparoscopic & open techniques were discussed.   Risks such as bleeding, infection, abscess, leak, reoperation, possible ostomy, hernia, heart attack, death, and other risks were discussed.  I noted a good likelihood this will help address the problem.   Goals of post-operative recovery were discussed as well.    The patient expressed understanding & wished to proceed with surgery.  OR FINDINGS:   Patient had significant L sidewall and anterior abdominal adhesions   The anastomosis rests 13 cm from the anal verge by rigid proctoscopy.  DESCRIPTION:    Informed consent was confirmed.  The patient underwent general anaesthesia without difficulty.  The patient was positioned appropriately.  VTE prevention in place.  The patient's abdomen was clipped, prepped, & draped in a sterile fashion.  Surgical timeout confirmed our plan.  The patient was positioned in reverse Trendelenburg.  Abdominal entry was gained using a Varies needle in the LUQ.  Entry was clean.  I induced carbon dioxide insufflation.  Camera inspection revealed no injury.  Extra ports were carefully placed under direct laparoscopic visualization.  I bluntly mobilized the omentum off of the anterior abdominal wall.  We next 45 minutes were used to lyse adhesions between the small bowel and the abdominal wall to allow for mobilization out of the left lower quadrant.  This was done using laparoscopic visualization and sharp dissection.   Once this was completed, I reflected the greater omentum and the upper abdomen the small bowel in the upper abdomen.  The distal sigmoid remained intact.  I scored the base of peritoneum of the right side of the mesentery of the left colon from the ligament of Treitz to the peritoneal reflection of the mid rectum.    I elevated the sigmoid mesentery and enetered into the retro-mesenteric plane. We were able to identify the left ureter and gonadal vessels. We kept those posterior within the retroperitoneum and elevated the left colon mesentery off that. I did isolated IMA pedicle but did not ligate it yet.  I continued distally and got into the avascular plane posterior to the mesorectum. This allowed me to help mobilize the rectum as  well by freeing the mesorectum off the sacrum.  I mobilized the peritoneal coverings towards the peritoneal reflection on both the right and left sides of the rectum.  I could see the right and left ureters and stayed away from them.    I skeletonized the inferior mesenteric artery pedicle.  I went ahead and ligated the inferior  mesenteric artery pedicle with bipolar EnSeal well above its takeoff from the aorta.  We ensured hemostasis. I skeletonized the mesorectum at the junction at the proximal rectum using blunt dissection & bipolar EnSeal.  I mobilized the left colon in a lateral to medial fashion off the line of Toldt up towards the splenic flexure to ensure good mobilization of the left colon to reach into the pelvis.  I mobilized the omentum off of the remaining descending colon as well.  I skeletonize the mesorectum using the Enseal device at the level of the rectosigmoid junction.  I then dissected out the ostomy from above using Bovie electrocaute  The ostomy was cleared from the fascial layer using careful blunt dissection.  Once the colostomy was completely mobilized, I identified a portion of soft colon at the distal end and divided the mesentery lateral to this.  A pursestring device was placed on the remaining colon and a 2-0 Prolene suture was placed through the device.  The remaining distal colostomy was transected using electrocautery.  Hemostasis of the mesenteric edge was achieved using interrupted 3-0 silk sutures on several bleeding vessels.  After this, a 29 mm EEA anvil was placed and the pursestring suture was tied tightly around this.  The fat was cleared along the edges of the anvil and then this was placed back into the abdomen.  I inspected several loops of small bowel that were involved in the adhesio lysis.  There was one small serosal tear that was closed using 2 3-0 silk sutures.  Once this was completed an Hartley wound protector was placed and the cap was placed over top of this.  The abdomen was reinsufflated.  He rectosigmoid was divided using the Covidian laparoscopic stapler with 2 purple loads.  Additional mobilization of the omentum off of the colon allowed for the remaining colon to mobilize into the pelvis without difficulty.  The EEA stapler was introduced into the rectum and brought out through  the anterior distal rectal stump.  An anastomosis was created.  There was no tension.  There was no leak when tested with insufflation under water.  The abdomen was desufflated.  A portion of omentum was resected due to it appearing somewhat ischemic.  This was removed from the abdomen as well as the distal sigmoid.  The abdomen was irrigated with 2 L of warm normal saline.    We then switched to clean gowns, gloves, instruments and drapes. The saline was aspirated from the abdomen.  Hemostasis was good.  The hernia sac was cleared away from the fascial edges of the colostomy site using electrocautery.  The fascia was then reapproximated using #1 Novafil interrupted sutures.  The subcutaneous tissue was closed using a 2-0 Vicryl pursestring suture.  The dermal layer was also closed using a 2-0 Vicryl pursestring suture and a Telfa wick was placed in the middle of this.  A sterile dressing was applied.  The remaining port sites were closed using a 4-0 Vicryl suture and Dermabond.  The patient tolerated this well and was sent to the postanesthesia care unit in stable condition.  All counts were correct per operating  room staff.  an MD assistant was necessary for tissue manipulation, retraction and positioning due to the complexity of the case and hospital policies

## 2016-12-10 NOTE — Interval H&P Note (Signed)
History and Physical Interval Note:  12/10/2016 9:01 AM  [name] [name]  has presented today for surgery, with the diagnosis of COLOSTOMY PRESENT  The various methods of treatment have been discussed with the patient and family. After consideration of risks, benefits and other options for treatment, the patient has consented to  Procedure(s): LAPAROSCOPIC COLOSTOMY REVERSAL (N/A) as a surgical intervention .  The patient's history has been reviewed, patient examined, no change in status, stable for surgery.  I have reviewed the patient's chart and labs.  Questions were answered to the patient's satisfaction.     Rosario Adie, MD  [facility]

## 2016-12-10 NOTE — Anesthesia Preprocedure Evaluation (Addendum)
Anesthesia Evaluation  Patient identified by MRN, date of birth, ID band Patient awake    Reviewed: Allergy & Precautions, H&P , NPO status , Patient's Chart, lab work & pertinent test results  Airway Mallampati: III  TM Distance: >3 FB Neck ROM: Full    Dental no notable dental hx. (+) Teeth Intact, Dental Advisory Given   Pulmonary neg pulmonary ROS,    Pulmonary exam normal breath sounds clear to auscultation       Cardiovascular hypertension, Pt. on medications  Rhythm:Regular Rate:Normal     Neuro/Psych Anxiety negative neurological ROS     GI/Hepatic Neg liver ROS, GERD  Medicated and Controlled,  Endo/Other  Morbid obesity  Renal/GU negative Renal ROS  negative genitourinary   Musculoskeletal   Abdominal   Peds  Hematology negative hematology ROS (+) anemia ,   Anesthesia Other Findings   Reproductive/Obstetrics negative OB ROS                            Anesthesia Physical Anesthesia Plan  ASA: III  Anesthesia Plan: General   Post-op Pain Management:    Induction: Intravenous  PONV Risk Score and Plan: 4 or greater and Ondansetron, Dexamethasone and Midazolam  Airway Management Planned: Oral ETT  Additional Equipment:   Intra-op Plan:   Post-operative Plan: Extubation in OR  Informed Consent: I have reviewed the patients History and Physical, chart, labs and discussed the procedure including the risks, benefits and alternatives for the proposed anesthesia with the patient or authorized representative who has indicated his/her understanding and acceptance.   Dental advisory given  Plan Discussed with: CRNA  Anesthesia Plan Comments:         Anesthesia Quick Evaluation

## 2016-12-10 NOTE — Transfer of Care (Signed)
Immediate Anesthesia Transfer of Care Note  Patient: [name] [name]  Procedure(s) Performed: LAPAROSCOPIC COLOSTOMY REVERSAL (N/A )  Patient Location: PACU  Anesthesia Type:General  Level of Consciousness: sedated, patient cooperative and responds to stimulation  Airway & Oxygen Therapy: Patient Spontanous Breathing and Patient connected to face mask oxygen  Post-op Assessment: Report given to RN and Post -op Vital signs reviewed and stable  Post vital signs: Reviewed and stable  Last Vitals:  Vitals:   12/10/16 0738  BP: 134/90  Pulse: (!) 59  Resp: 16  Temp: 36.6 C  SpO2: 98%    Last Pain:  Vitals:   12/10/16 0738  TempSrc: Oral      Patients Stated Pain Goal: 4 (01/09/34 6701)  Complications: No apparent anesthesia complications

## 2016-12-11 ENCOUNTER — Encounter (HOSPITAL_COMMUNITY): Payer: Self-pay | Admitting: General Surgery

## 2016-12-11 DIAGNOSIS — K5732 Diverticulitis of large intestine without perforation or abscess without bleeding: Secondary | ICD-10-CM

## 2016-12-11 DIAGNOSIS — I10 Essential (primary) hypertension: Secondary | ICD-10-CM | POA: Diagnosis present

## 2016-12-11 DIAGNOSIS — E785 Hyperlipidemia, unspecified: Secondary | ICD-10-CM | POA: Diagnosis present

## 2016-12-11 LAB — BASIC METABOLIC PANEL
ANION GAP: 9 (ref 5–15)
Anion gap: 8 (ref 5–15)
BUN: 12 mg/dL (ref 6–20)
BUN: 9 mg/dL (ref 6–20)
CALCIUM: 8.6 mg/dL — AB (ref 8.9–10.3)
CHLORIDE: 102 mmol/L (ref 101–111)
CO2: 27 mmol/L (ref 22–32)
CO2: 27 mmol/L (ref 22–32)
CREATININE: 0.94 mg/dL (ref 0.44–1.00)
Calcium: 8.5 mg/dL — ABNORMAL LOW (ref 8.9–10.3)
Chloride: 107 mmol/L (ref 101–111)
Creatinine, Ser: 0.91 mg/dL (ref 0.44–1.00)
GFR calc Af Amer: >60 mL/min (ref >60)
GFR calc Af Amer: >60 mL/min (ref >60)
GFR calc non Af Amer: >60 mL/min (ref >60)
GFR calc non Af Amer: >60 mL/min (ref >60)
GLUCOSE: 120 mg/dL — AB (ref 65–99)
GLUCOSE: 113 mg/dL — AB (ref 65–99)
POTASSIUM: 2.8 mmol/L — AB (ref 3.5–5.1)
Potassium: 4 mmol/L (ref 3.5–5.1)
Sodium: 138 mmol/L (ref 135–145)
Sodium: 142 mmol/L (ref 135–145)

## 2016-12-11 LAB — CBC
HEMATOCRIT: 33.4 % — AB (ref 36.0–46.0)
HEMOGLOBIN: 11.1 g/dL — AB (ref 12.0–15.0)
MCH: 29.2 pg (ref 26.0–34.0)
MCHC: 33.2 g/dL (ref 30.0–36.0)
MCV: 87.9 fL (ref 78.0–100.0)
Platelets: 219 10*3/uL (ref 150–400)
RBC: 3.8 MIL/uL — ABNORMAL LOW (ref 3.87–5.11)
RDW: 14.7 % (ref 11.5–15.5)
WBC: 9.7 10*3/uL (ref 4.0–10.5)

## 2016-12-11 MED ORDER — POTASSIUM CHLORIDE CRYS ER 20 MEQ PO TBCR
40.0000 meq | EXTENDED_RELEASE_TABLET | Freq: Once | ORAL | Status: AC
  Administered 2016-12-11: 40 meq via ORAL
  Filled 2016-12-11: qty 2

## 2016-12-11 MED ORDER — POTASSIUM CHLORIDE 10 MEQ/100ML IV SOLN
10.0000 meq | INTRAVENOUS | Status: AC
  Administered 2016-12-11 (×4): 10 meq via INTRAVENOUS
  Filled 2016-12-11 (×4): qty 100

## 2016-12-11 MED ORDER — POTASSIUM CHLORIDE CRYS ER 20 MEQ PO TBCR: 40.0000 meq | EXTENDED_RELEASE_TABLET | Freq: Two times a day (BID) | ORAL | Status: DC

## 2016-12-11 NOTE — Discharge Instructions (Addendum)
SURGERY: POST OP INSTRUCTIONS °(Surgery for small bowel obstruction, colon resection, etc) ° ° °###################################################################### ° °EAT °Gradually transition to a high fiber diet with a fiber supplement over the next few days after discharge ° °WALK °Walk an hour a day.  Control your pain to do that.   ° °CONTROL PAIN °Control pain so that you can walk, sleep, tolerate sneezing/coughing, go up/down stairs. ° °HAVE A BOWEL MOVEMENT DAILY °Keep your bowels regular to avoid problems.  OK to try a laxative to override constipation.  OK to use an antidairrheal to slow down diarrhea.  Call if not better after 2 tries ° °CALL IF YOU HAVE PROBLEMS/CONCERNS °Call if you are still struggling despite following these instructions. °Call if you have concerns not answered by these instructions ° °###################################################################### ° ° °DIET °Follow a light diet the first few days at home.  Start with a bland diet such as soups, liquids, starchy foods, low fat foods, etc.  If you feel full, bloated, or constipated, stay on a ful liquid or pureed/blenderized diet for a few days until you feel better and no longer constipated. °Be sure to drink plenty of fluids every day to avoid getting dehydrated (feeling dizzy, not urinating, etc.). °Gradually add a fiber supplement to your diet over the next week.  Gradually get back to a regular solid diet.  Avoid fast food or heavy meals the first week as you are more likely to get nauseated. °It is expected for your digestive tract to need a few months to get back to normal.  It is common for your bowel movements and stools to be irregular.  You will have occasional bloating and cramping that should eventually fade away.  Until you are eating solid food normally, off all pain medications, and back to regular activities; your bowels will not be normal. °Focus on eating a low-fat, high fiber diet the rest of your life  (See Getting to Good Bowel Health, below). ° °CARE of your INCISION or WOUND °It is good for closed incision and even open wounds to be washed every day.  Shower every day.  Short baths are fine.  Wash the incisions and wounds clean with soap & water.    °If you have a closed incision(s), wash the incision with soap & water every day.  You may leave closed incisions open to air if it is dry.   You may cover the incision with clean gauze & replace it after your daily shower for comfort. °If you have skin tapes (Steristrips) or skin glue (Dermabond) on your incision, leave them in place.  They will fall off on their own like a scab.  You may trim any edges that curl up with clean scissors.  If you have staples, set up an appointment for them to be removed in the office in 10 days after surgery.  °If you have a drain, wash around the skin exit site with soap & water and place a new dressing of gauze or band aid around the skin every day.  Keep the drain site clean & dry.    °If you have an open wound with packing, see wound care instructions.  In general, it is encouraged that you remove your dressing and packing, shower with soap & water, and replace your dressing once a day.  Pack the wound with clean gauze moistened with normal (0.9%) saline to keep the wound moist & uninfected.  Pressure on the dressing for 30 minutes will stop most wound   bleeding.  Eventually your body will heal & pull the open wound closed over the next few months.  °Raw open wounds will occasionally bleed or secrete yellow drainage until it heals closed.  Drain sites will drain a little until the drain is removed.  Even closed incisions can have mild bleeding or drainage the first few days until the skin edges scab over & seal.   °If you have an open wound with a wound vac, see wound vac care instructions. ° ° ° ° °ACTIVITIES as tolerated °Start light daily activities --- self-care, walking, climbing stairs-- beginning the day after surgery.   Gradually increase activities as tolerated.  Control your pain to be active.  Stop when you are tired.  Ideally, walk several times a day, eventually an hour a day.   °Most people are back to most day-to-day activities in a few weeks.  It takes 4-8 weeks to get back to unrestricted, intense activity. °If you can walk 30 minutes without difficulty, it is safe to try more intense activity such as jogging, treadmill, bicycling, low-impact aerobics, swimming, etc. °Save the most intensive and strenuous activity for last (Usually 4-8 weeks after surgery) such as sit-ups, heavy lifting, contact sports, etc.  Refrain from any intense heavy lifting or straining until you are off narcotics for pain control.  You will have off days, but things should improve week-by-week. °DO NOT PUSH THROUGH PAIN.  Let pain be your guide: If it hurts to do something, don't do it.  Pain is your body warning you to avoid that activity for another week until the pain goes down. °You may drive when you are no longer taking narcotic prescription pain medication, you can comfortably wear a seatbelt, and you can safely make sudden turns/stops to protect yourself without hesitating due to pain. °You may have sexual intercourse when it is comfortable. If it hurts to do something, stop. ° °MEDICATIONS °Take your usually prescribed home medications unless otherwise directed.   °Blood thinners:  °Usually you can restart any strong blood thinners after the second postoperative day.  It is OK to take aspirin right away.    ° If you are on strong blood thinners (warfarin/Coumadin, Plavix, Xerelto, Eliquis, Pradaxa, etc), discuss with your surgeon, medicine PCP, and/or cardiologist for instructions on when to restart the blood thinner & if blood monitoring is needed (PT/INR blood check, etc).   ° ° °PAIN CONTROL °Pain after surgery or related to activity is often due to strain/injury to muscle, tendon, nerves and/or incisions.  This pain is usually  short-term and will improve in a few months.  °To help speed the process of healing and to get back to regular activity more quickly, DO THE FOLLOWING THINGS TOGETHER: °1. Increase activity gradually.  DO NOT PUSH THROUGH PAIN °2. Use Ice and/or Heat °3. Try Gentle Massage and/or Stretching °4. Take over the counter pain medication °5. Take Narcotic prescription pain medication for more severe pain ° °Good pain control = faster recovery.  It is better to take more medicine to be more active than to stay in bed all day to avoid medications. °1.  Increase activity gradually °Avoid heavy lifting at first, then increase to lifting as tolerated over the next 6 weeks. °Do not “push through” the pain.  Listen to your body and avoid positions and maneuvers than reproduce the pain.  Wait a few days before trying something more intense °Walking an hour a day is encouraged to help your body recover faster   and more safely.  Start slowly and stop when getting sore.  If you can walk 30 minutes without stopping or pain, you can try more intense activity (running, jogging, aerobics, cycling, swimming, treadmill, sex, sports, weightlifting, etc.) °Remember: If it hurts to do it, then don’t do it! °2. Use Ice and/or Heat °You will have swelling and bruising around the incisions.  This will take several weeks to resolve. °Ice packs or heating pads (6-8 times a day, 30-60 minutes at a time) will help sooth soreness & bruising. °Some people prefer to use ice alone, heat alone, or alternate between ice & heat.  Experiment and see what works best for you.  Consider trying ice for the first few days to help decrease swelling and bruising; then, switch to heat to help relax sore spots and speed recovery. °Shower every day.  Short baths are fine.  It feels good!  Keep the incisions and wounds clean with soap & water.   °3. Try Gentle Massage and/or Stretching °Massage at the area of pain many times a day °Stop if you feel pain - do not  overdo it °4. Take over the counter pain medication °This helps the muscle and nerve tissues become less irritable and calm down faster °Choose ONE of the following over-the-counter anti-inflammatory medications: °Acetaminophen 500mg tabs (Tylenol) 1-2 pills with every meal and just before bedtime (avoid if you have liver problems or if you have acetaminophen in you narcotic prescription) °Naproxen 220mg tabs (ex. Aleve, Naprosyn) 1-2 pills twice a day (avoid if you have kidney, stomach, IBD, or bleeding problems) °Ibuprofen 200mg tabs (ex. Advil, Motrin) 3-4 pills with every meal and just before bedtime (avoid if you have kidney, stomach, IBD, or bleeding problems) °Take with food/snack several times a day as directed for at least 2 weeks to help keep pain / soreness down & more manageable. °5. Take Narcotic prescription pain medication for more severe pain °A prescription for strong pain control is often given to you upon discharge (for example: oxycodone/Percocet, hydrocodone/Norco/Vicodin, or tramadol/Ultram) °Take your pain medication as prescribed. °Be mindful that most narcotic prescriptions contain Tylenol (acetaminophen) as well - avoid taking too much Tylenol. °If you are having problems/concerns with the prescription medicine (does not control pain, nausea, vomiting, rash, itching, etc.), please call us [phone] to see if we need to switch you to a different pain medicine that will work better for you and/or control your side effects better. °If you need a refill on your pain medication, you must call the office before 4 pm and on weekdays only.  By federal law, prescriptions for narcotics cannot be called into a pharmacy.  They must be filled out on paper & picked up from our office by the patient or authorized caretaker.  Prescriptions cannot be filled after 4 pm nor on weekends.   ° °WHEN TO CALL US [phone] °Severe uncontrolled or worsening pain  °Fever over 101 F (38.5 C) °Concerns with  the incision: Worsening pain, redness, rash/hives, swelling, bleeding, or drainage °Reactions / problems with new medications (itching, rash, hives, nausea, etc.) °Nausea and/or vomiting °Difficulty urinating °Difficulty breathing °Worsening fatigue, dizziness, lightheadedness, blurred vision °Other concerns °If you are not getting better after two weeks or are noticing you are getting worse, contact our office [phone] for further advice.  We may need to adjust your medications, re-evaluate you in the office, send you to the emergency room, or see what other things we can do to help. °The   clinic staff is available to answer your questions during regular business hours (8:30am-5pm).  Please don’t hesitate to call and ask to speak to one of our nurses for clinical concerns.    °A surgeon from [facility] is always on call at the hospitals 24 hours/day °If you have a medical emergency, go to the nearest emergency room or call 911. ° °FOLLOW UP in our office °One the day of your discharge from the hospital (or the next business weekday), please call [facility] to set up or confirm an appointment to see your surgeon in the office for a follow-up appointment.  Usually it is 2-3 weeks after your surgery.   °If you have skin staples at your incision(s), let the office know so we can set up a time in the office for the nurse to remove them (usually around 10 days after surgery). °Make sure that you call for appointments the day of discharge (or the next business weekday) from the hospital to ensure a convenient appointment time. °IF YOU HAVE DISABILITY OR FAMILY LEAVE FORMS, BRING THEM TO THE OFFICE FOR PROCESSING.  DO NOT GIVE THEM TO YOUR DOCTOR. ° °[facility] °[address], [address], [place],   [address] ? °[phone] - Main °[phone] - Toll Free,  [phone] - Fax °[email or web address] ° °GETTING TO GOOD BOWEL HEALTH. °It is  expected for your digestive tract to need a few months to get back to normal.  It is common for your bowel movements and stools to be irregular.  You will have occasional bloating and cramping that should eventually fade away.  Until you are eating solid food normally, off all pain medications, and back to regular activities; your bowels will not be normal.   °Avoiding constipation °The goal: ONE SOFT BOWEL MOVEMENT A DAY!    °Drink plenty of fluids.  Choose water first. °TAKE A FIBER SUPPLEMENT EVERY DAY THE REST OF YOUR LIFE °During your first week back home, gradually add back a fiber supplement every day °Experiment which form you can tolerate.   There are many forms such as powders, tablets, wafers, gummies, etc °Psyllium bran (Metamucil), methylcellulose (Citrucel), Miralax or Glycolax, Benefiber, Flax Seed.  °Adjust the dose week-by-week (1/2 dose/day to 6 doses a day) until you are moving your bowels 1-2 times a day.  Cut back the dose or try a different fiber product if it is giving you problems such as diarrhea or bloating. °Sometimes a laxative is needed to help jump-start bowels if constipated until the fiber supplement can help regulate your bowels.  If you are tolerating eating & you are farting, it is okay to try a gentle laxative such as double dose MiraLax, prune juice, or Milk of Magnesia.  Avoid using laxatives too often. °Stool softeners can sometimes help counteract the constipating effects of narcotic pain medicines.  It can also cause diarrhea, so avoid using for too long. °If you are still constipated despite taking fiber daily, eating solids, and a few doses of laxatives, call our office. °Controlling diarrhea °Try drinking liquids and eating bland foods for a few days to avoid stressing your intestines further. °Avoid dairy products (especially milk & ice cream) for a short time.  The intestines often can lose the ability to digest lactose when stressed. °Avoid foods that cause gassiness or  bloating.  Typical foods include beans and other legumes, cabbage, broccoli, and dairy foods.  Avoid greasy, spicy, fast foods.  Every person has   some sensitivity to other foods, so listen to your body and avoid those foods that trigger problems for you. Probiotics (such as active yogurt, Align, etc) may help repopulate the intestines and colon with normal bacteria and calm down a sensitive digestive tract Adding a fiber supplement gradually can help thicken stools by absorbing excess fluid and retrain the intestines to act more normally.  Slowly increase the dose over a few weeks.  Too much fiber too soon can backfire and cause cramping & bloating. It is okay to try and slow down diarrhea with a few doses of antidiarrheal medicines.   Bismuth subsalicylate (ex. Kayopectate, Pepto Bismol) for a few doses can help control diarrhea.  Avoid if pregnant.   Loperamide (Imodium) can slow down diarrhea.  Start with one tablet ('2mg'$ ) first.  Avoid if you are having fevers or severe pain.  ILEOSTOMY PATIENTS WILL HAVE CHRONIC DIARRHEA since their colon is not in use.    Drink plenty of liquids.  You will need to drink even more glasses of water/liquid a day to avoid getting dehydrated. Record output from your ileostomy.  Expect to empty the bag every 3-4 hours at first.  Most people with a permanent ileostomy empty their bag 4-6 times at the least.   Use antidiarrheal medicine (especially Imodium) several times a day to avoid getting dehydrated.  Start with a dose at bedtime & breakfast.  Adjust up or down as needed.  Increase antidiarrheal medications as directed to avoid emptying the bag more than 8 times a day (every 3 hours). Work with your wound ostomy nurse to learn care for your ostomy.  See ostomy care instructions. TROUBLESHOOTING IRREGULAR BOWELS 1) Start with a soft & bland diet. No spicy, greasy, or fried foods.  2) Avoid gluten/wheat or dairy products from diet to see if symptoms improve. 3) Miralax  17gm or flax seed mixed in [place]. water or juice-daily. May use 2-4 times a day as needed. 4) Gas-X, Phazyme, etc. as needed for gas & bloating.  5) Prilosec (omeprazole) over-the-counter as needed 6)  Consider probiotics (Align, Activa, etc) to help calm the bowels down  Call your doctor if you are getting worse or not getting better.  Sometimes further testing (cultures, endoscopy, X-ray studies, CT scans, bloodwork, etc.) may be needed to help diagnose and treat the cause of the diarrhea. [facility], [place], [place], [place], [place]  [address] [phone] - Main.    [phone]  - Toll Free.   [phone] - Fax [email or web address]  WOUND CARE  It is important that the wound be kept open.   -Keeping the skin edges apart will allow the wound to gradually heal from the base upwards.   - If the skin edges of the wound close too early, a new fluid pocket can form and infection can occur. -This is the reason to pack deeper wounds with gauze or ribbon -This is why drained wounds cannot be sewed closed right away  A healthy wound should form a lining of bright red "beefy" granulating tissue that will help shrink the wound and help the edges grow new skin into it.   -A little mucus / yellow discharge is normal (the body's natural way to try and form a scab) and should be gently washed off with soap and water with daily dressing changes.  -Green or foul smelling drainage implies bacterial colonization and can slow wound healing - a short course of antibiotic ointment (3-5 days)  can help it clear up.  Call the doctor if it does not improve or worsens  -Avoid use of antibiotic ointments for more than a week as they can slow wound healing over time.    -Sometimes other wound care products will be used to reduce need for dressing changes and/or help clean up dirty wounds -Sometimes the surgeon needs to debride the wound in the office to remove dead or  infected tissue out of the wound so it can heal more quickly and safely.    Change the dressing at least once a day -Wash the wound with mild soap and water gently every day.  It is good to shower or bathe the wound to help it clean out. -Cover with a clean gauze and tape -paper or Medipore tape tend to be gentle on the skin -rotate the orientation of the tape to avoid repeated stress/trauma on the skin -using an ACE or Coban wrap on wounds on arms or legs can be used instead.  Returning the see the surgeon is helpful to follow the healing process and help the wound close as fast as possible.   Diverticulitis Diverticulitis is inflammation or infection of small pouches in your colon that form when you have a condition called diverticulosis. The pouches in your colon are called diverticula. Your colon, or large intestine, is where water is absorbed and stool is formed. Complications of diverticulitis can include:  Bleeding.  Severe infection.  Severe pain.  Perforation of your colon.  Obstruction of your colon.  What are the causes? Diverticulitis is caused by bacteria. Diverticulitis happens when stool becomes trapped in diverticula. This allows bacteria to grow in the diverticula, which can lead to inflammation and infection. What increases the risk? People with diverticulosis are at risk for diverticulitis. Eating a diet that does not include enough fiber from fruits and vegetables may make diverticulitis more likely to develop. What are the signs or symptoms? Symptoms of diverticulitis may include:  Abdominal pain and tenderness. The pain is normally located on the left side of the abdomen, but may occur in other areas.  Fever and chills.  Bloating.  Cramping.  Nausea.  Vomiting.  Constipation.  Diarrhea.  Blood in your stool.  How is this diagnosed? Your health care provider will ask you about your medical history and do a physical exam. You may need to have  tests done because many medical conditions can cause the same symptoms as diverticulitis. Tests may include:  Blood tests.  Urine tests.  Imaging tests of the abdomen, including X-rays and CT scans.  When your condition is under control, your health care provider may recommend that you have a colonoscopy. A colonoscopy can show how severe your diverticula are and whether something else is causing your symptoms. How is this treated? Most cases of diverticulitis are mild and can be treated at home. Treatment may include:  Taking over-the-counter pain medicines.  Following a clear liquid diet.  Taking antibiotic medicines by mouth for 7-10 days.  More severe cases may be treated at a hospital. Treatment may include:  Not eating or drinking.  Taking prescription pain medicine.  Receiving antibiotic medicines through an IV tube.  Receiving fluids and nutrition through an IV tube.  Surgery.  Follow these instructions at home:  Follow your health care providers instructions carefully.  Follow a full liquid diet or other diet as directed by your health care provider. After your symptoms improve, your health care provider may tell you to  change your diet. He or she may recommend you eat a high-fiber diet. Fruits and vegetables are good sources of fiber. Fiber makes it easier to pass stool.  Take fiber supplements or probiotics as directed by your health care provider.  Only take medicines as directed by your health care provider.  Keep all your follow-up appointments. Contact a health care provider if:  Your pain does not improve.  You have a hard time eating food.  Your bowel movements do not return to normal. Get help right away if:  Your pain becomes worse.  Your symptoms do not get better.  Your symptoms suddenly get worse.  You have a fever.  You have repeated vomiting.  You have bloody or black, tarry stools. This information is not intended to replace  advice given to you by your health care provider. Make sure you discuss any questions you have with your health care provider. Document Released: 11/19/2004 Document Revised: 07/18/2015 Document Reviewed: 01/04/2013 Elsevier Interactive Patient Education  2017 [employer].

## 2016-12-11 NOTE — Progress Notes (Signed)
CRITICAL VALUE ALERT  Critical Value:  k 2.8  Date & Time Notied:  10/19 0645  Provider Notified: Dr Saunders Glance  Orders Received/Actions taken: yes

## 2016-12-11 NOTE — Progress Notes (Signed)
1 Day Post-Op Lap colostomy reversal Subjective: No nausea, no flatus.  Pain controlled.  Ambulated in hall last night.  Having some blurry vision, no dizziness  Objective: Vital signs in last 24 hours: Temp:  [97.5 F (36.4 C)-98.6 F (37 C)] 97.8 F (36.6 C) (10/19 0600) Pulse Rate:  [63-110] 63 (10/19 0600) Resp:  [14-24] 18 (10/19 0600) BP: (103-136)/(62-95) 103/62 (10/19 0600) SpO2:  [94 %-100 %] 96 % (10/19 0600) Weight:  [108.6 kg (239 lb 6.4 oz)] 108.6 kg (239 lb 6.4 oz) (10/18 0817)   Intake/Output from previous day: 10/18 0701 - 10/19 0700 In: 871.3 [P.O.:30; I.V.:841.3] Out: 1750 [Urine:1650; Blood:100] Intake/Output this shift: No intake/output data recorded.   General appearance: alert and cooperative GI: soft, nondistended  Incision: no significant drainage  Lab Results:   Recent Labs  12/11/16 0524  WBC 9.7  HGB 11.1*  HCT 33.4*  PLT 219   BMET  Recent Labs  12/11/16 0524  NA 138  K 2.8*  CL 102  CO2 27  GLUCOSE 120*  BUN 12  CREATININE 0.91  CALCIUM 8.5*   PT/INR No results for input(s): LABPROT, INR in the last 72 hours. ABG No results for input(s): PHART, HCO3 in the last 72 hours.  Invalid input(s): PCO2, PO2  MEDS, Scheduled . acetaminophen  1,000 mg Oral Q6H  . alvimopan  12 mg Oral BID  . enoxaparin (LOVENOX) injection  40 mg Subcutaneous Q24H  . gabapentin  300 mg Oral BID  . pantoprazole  40 mg Oral Daily  . potassium chloride  40 mEq Oral Once  . rOPINIRole  0.25 mg Oral QHS  . saccharomyces boulardii  250 mg Oral BID  . sertraline  100 mg Oral Daily  . [START ON 12/12/2016] triamterene-hydrochlorothiazide  1 tablet Oral Daily    Studies/Results: No results found.  Assessment: s/p Procedure(s): LAPAROSCOPIC COLOSTOMY REVERSAL Patient Active Problem List   Diagnosis Date Noted  . 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 Diverticulitis 06/07/2016  . 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 Expected blood loss anemia 11/24/2011  . Obese 11/24/2011  . Hypokalemia 11/24/2011  . 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 Gastritis 11/04/2011  . DYSPHAGIA 01/10/2008    Expected post op course  Plan: d/c foley  Advance diet to soft foods Hypokalemia: replace IV and PO, recheck this afternoon.   Blurry vision: will follow. if no improvement by tomorrow, will ask anesthesia to see pt again Ambulate  Change outer dressing daily.  Elza Rafter can be removed on POD 3   LOS: 1 day     .Rosario Adie, [facility], [place]   12/11/2016 7:52 AM

## 2016-12-12 ENCOUNTER — Encounter (HOSPITAL_COMMUNITY): Payer: Self-pay | Admitting: Surgery

## 2016-12-12 DIAGNOSIS — J310 Chronic rhinitis: Secondary | ICD-10-CM | POA: Diagnosis present

## 2016-12-12 DIAGNOSIS — F419 Anxiety disorder, unspecified: Secondary | ICD-10-CM | POA: Diagnosis present

## 2016-12-12 LAB — BASIC METABOLIC PANEL
ANION GAP: 5 (ref 5–15)
BUN: 9 mg/dL (ref 6–20)
CALCIUM: 8.7 mg/dL — AB (ref 8.9–10.3)
CO2: 28 mmol/L (ref 22–32)
CREATININE: 0.86 mg/dL (ref 0.44–1.00)
Chloride: 108 mmol/L (ref 101–111)
GFR calc Af Amer: >60 mL/min (ref >60)
GLUCOSE: 107 mg/dL — AB (ref 65–99)
Potassium: 3.6 mmol/L (ref 3.5–5.1)
Sodium: 141 mmol/L (ref 135–145)

## 2016-12-12 LAB — CBC
HCT: 31.9 % — ABNORMAL LOW (ref 36.0–46.0)
Hemoglobin: 10.8 g/dL — ABNORMAL LOW (ref 12.0–15.0)
MCH: 30.4 pg (ref 26.0–34.0)
MCHC: 33.9 g/dL (ref 30.0–36.0)
MCV: 89.9 fL (ref 78.0–100.0)
Platelets: 208 10*3/uL (ref 150–400)
RBC: 3.55 MIL/uL — ABNORMAL LOW (ref 3.87–5.11)
RDW: 15 % (ref 11.5–15.5)
WBC: 7.8 10*3/uL (ref 4.0–10.5)

## 2016-12-12 MED ORDER — LIP MEDEX EX OINT: 1.0000 "application " | TOPICAL_OINTMENT | Freq: Two times a day (BID) | CUTANEOUS | Status: DC

## 2016-12-12 MED ORDER — MENTHOL 3 MG MT LOZG: 1.0000 | LOZENGE | OROMUCOSAL | Status: DC | PRN

## 2016-12-12 MED ORDER — LORATADINE 10 MG PO TABS: 10.0000 mg | ORAL_TABLET | Freq: Every day | ORAL | Status: DC | PRN

## 2016-12-12 MED ORDER — PSYLLIUM 95 % PO PACK
1.0000 | PACK | Freq: Every day | ORAL | Status: DC
  Administered 2016-12-12: 1 via ORAL
  Filled 2016-12-12: qty 1

## 2016-12-12 MED ORDER — VITAMIN D3 25 MCG (1000 UNIT) PO TABS
1000.0000 [IU] | ORAL_TABLET | Freq: Every day | ORAL | Status: DC
  Administered 2016-12-12: 1000 [IU] via ORAL
  Filled 2016-12-12 (×3): qty 1

## 2016-12-12 MED ORDER — HYDRALAZINE HCL 20 MG/ML IJ SOLN: 5.0000 mg | Freq: Four times a day (QID) | INTRAMUSCULAR | Status: DC | PRN

## 2016-12-12 MED ORDER — LACTATED RINGERS IV BOLUS (SEPSIS): 1000.0000 mL | Freq: Three times a day (TID) | INTRAVENOUS | Status: DC | PRN

## 2016-12-12 MED ORDER — PHENOL 1.4 % MT LIQD: 1.0000 | OROMUCOSAL | Status: DC | PRN

## 2016-12-12 MED ORDER — PROCHLORPERAZINE EDISYLATE 5 MG/ML IJ SOLN: 5.0000 mg | INTRAMUSCULAR | Status: DC | PRN

## 2016-12-12 MED ORDER — METHOCARBAMOL 1000 MG/10ML IJ SOLN: 1000.0000 mg | Freq: Four times a day (QID) | INTRAVENOUS | Status: DC | PRN

## 2016-12-12 MED ORDER — METOPROLOL TARTRATE 5 MG/5ML IV SOLN: 5.0000 mg | Freq: Four times a day (QID) | INTRAVENOUS | Status: DC | PRN

## 2016-12-12 MED ORDER — HYDROCORTISONE 1 % EX CREA: 1.0000 "application " | TOPICAL_CREAM | Freq: Three times a day (TID) | CUTANEOUS | Status: DC | PRN

## 2016-12-12 MED ORDER — SODIUM CHLORIDE 0.9 % IV SOLN: 250.0000 mL | INTRAVENOUS | Status: DC | PRN

## 2016-12-12 MED ORDER — FLUTICASONE PROPIONATE 50 MCG/ACT NA SUSP
1.0000 | Freq: Every day | NASAL | Status: DC | PRN
  Filled 2016-12-12: qty 16

## 2016-12-12 MED ORDER — GUAIFENESIN-DM 100-10 MG/5ML PO SYRP: 10.0000 mL | ORAL_SOLUTION | ORAL | Status: DC | PRN

## 2016-12-12 MED ORDER — HYDROCORTISONE 2.5 % RE CREA: 1.0000 "application " | TOPICAL_CREAM | Freq: Four times a day (QID) | RECTAL | Status: DC | PRN

## 2016-12-12 MED ORDER — METHOCARBAMOL 500 MG PO TABS
1000.0000 mg | ORAL_TABLET | Freq: Three times a day (TID) | ORAL | Status: DC
  Administered 2016-12-12 – 2016-12-13 (×3): 1000 mg via ORAL
  Filled 2016-12-12 (×4): qty 2

## 2016-12-12 MED ORDER — METHOCARBAMOL 500 MG PO TABS: 1000.0000 mg | ORAL_TABLET | Freq: Four times a day (QID) | ORAL | Status: DC | PRN

## 2016-12-12 MED ORDER — SODIUM CHLORIDE 0.9% FLUSH: 3.0000 mL | INTRAVENOUS | Status: DC | PRN

## 2016-12-12 MED ORDER — SODIUM CHLORIDE 0.9% FLUSH: 3.0000 mL | Freq: Two times a day (BID) | INTRAVENOUS | Status: DC

## 2016-12-12 NOTE — Progress Notes (Signed)
[facility]  [address] [place], [place] [address] Phone: [phone]  FAX: [phone]      [name] [name] [id number] 1961/01/03  CARE TEAM:  PCP: Shon Baton, MD  Outpatient Care Team: Patient Care Team: Shon Baton, MD as PCP - General (Internal Medicine)  Inpatient Treatment Team: Treatment Team: Attending Provider: Leighton Ruff, MD; Technician: Abbe Amsterdam, NT; Technician: Sueanne Margarita, NT; Technician: Aida Raider, NT; Technician: Leda Quail, NT; Registered Nurse: Celedonio Savage, RN   Problem List:   Principal Problem:   Diverticulitis of colon s/p colostomy takedown 12/10/2016 Active Problems:   Expected blood loss anemia   Obese   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  More than 50% of that time was spent in counseling.  Adin Hector, M.D., F.A.C.S. Gastrointestinal and Minimally Invasive Surgery [facility] [address] [place], [place] [address] [phone] Main / Paging   12/12/2016    Subjective: (Chief complaint)  Sore Walked in hallways Husband at bedisde  Objective:  Vital signs:  Vitals:   12/11/16 0600 12/11/16 1454 12/11/16 2125 12/12/16 0541  BP: 103/62 101/64 125/70 127/69  Pulse: 63 66 73 62  Resp: _0 Temp: 97.8 F (36.6 C) 98 F (36.7 C) 98.7 F (37.1 C) 98.6 F (37 C)  TempSrc: Oral Oral Oral Oral  SpO2: 96% 93% 93% 98%  Weight:      Height:         Last BM Date: 12/10/16  Intake/Output   Yesterday:  10/19 0701 - 10/20 0700 In: 2199.9 [P.O.:1580; I.V.:419.9; IV Piggyback:200] Out: 1679 [Urine:1679] This shift:  Total I/O In: -  Out: 700 [Urine:700]  Bowel function:  Flatus: No  BM:  No  Drain: (No drain)   Physical Exam:  General: Pt awake/alert/oriented x4 in mild acute distress Eyes: PERRL, normal EOM.  Sclera clear.  No icterus Neuro: CN II-XII intact w/o focal sensory/motor deficits. Lymph: No head/neck/groin lymphadenopathy Psych:  No delerium/psychosis/paranoia HENT: Normocephalic, Mucus membranes moist.  No thrush Neck: Supple, No tracheal deviation Chest: No chest wall pain w good excursion CV:  Pulses intact.  Regular rhythm MS: Normal AROM mjr joints.  No obvious deformity  Abdomen: Soft.  Mildy distended.  Mildly tender at incisions only.  No evidence of peritonitis.  No incarcerated hernias.  Ext:  No deformity.  No mjr edema.  No cyanosis Skin: No petechiae / purpura  Results:   Labs: Results for orders placed or performed during the hospital encounter of 12/10/16 (from the past 48 hour(s))  Basic metabolic panel     Status: Abnormal   Collection Time: 12/11/16  5:24 AM  Result Value Ref Range   Sodium 138 135 - 145 mmol/L   Potassium 2.8 (L) 3.5 - 5.1 mmol/L   Chloride 102 101 - 111 mmol/L   CO2 27 22 - 32 mmol/L   Glucose, Bld 120 (H) 65 - 99 mg/dL   BUN 12 6 - 20 mg/dL   Creatinine, Ser 0.91 0.44 -  1.00 mg/dL   Calcium 8.5 (L) 8.9 - 10.3 mg/dL   GFR calc non Af Amer >60 >60 mL/min   GFR calc Af Amer >60 >60 mL/min    Comment: (NOTE) The eGFR has been calculated using the CKD EPI equation. This calculation has not been validated in all clinical situations. eGFR's persistently <60 mL/min signify possible Chronic Kidney Disease.    Anion gap 9 5 - 15  CBC     Status: Abnormal   Collection Time: 12/11/16  5:24 AM  Result Value Ref Range   WBC 9.7 4.0 - 10.5 K/uL   RBC 3.80 (L)  3.87 - 5.11 MIL/uL   Hemoglobin 11.1 (L) 12.0 - 15.0 g/dL   HCT 33.4 (L) 36.0 - 46.0 %   MCV 87.9 78.0 - 100.0 fL   MCH 29.2 26.0 - 34.0 pg   MCHC 33.2 30.0 - 36.0 g/dL   RDW 14.7 11.5 - 15.5 %   Platelets 219 150 - 400 K/uL  Basic metabolic panel     Status: Abnormal   Collection Time: 12/11/16  2:18 PM  Result Value Ref Range   Sodium 142 135 - 145 mmol/L   Potassium 4.0 3.5 - 5.1 mmol/L    Comment: DELTA CHECK NOTED REPEATED TO VERIFY NO VISIBLE HEMOLYSIS    Chloride 107 101 - 111 mmol/L   CO2 27 22 - 32 mmol/L   Glucose, Bld 113 (H) 65 - 99 mg/dL   BUN 9 6 - 20 mg/dL   Creatinine, Ser 0.94 0.44 - 1.00 mg/dL   Calcium 8.6 (L) 8.9 - 10.3 mg/dL   GFR calc non Af Amer >60 >60 mL/min   GFR calc Af Amer >60 >60 mL/min    Comment: (NOTE) The eGFR has been calculated using the CKD EPI equation. This calculation has not been validated in all clinical situations. eGFR's persistently <60 mL/min signify possible Chronic Kidney Disease.    Anion gap 8 5 - 15  Basic metabolic panel     Status: Abnormal   Collection Time: 12/12/16  4:22 AM  Result Value Ref Range   Sodium 141 135 - 145 mmol/L   Potassium 3.6 3.5 - 5.1 mmol/L   Chloride 108 101 - 111 mmol/L   CO2 28 22 - 32 mmol/L   Glucose, Bld 107 (H) 65 - 99 mg/dL   BUN 9 6 - 20 mg/dL   Creatinine, Ser 0.86 0.44 - 1.00 mg/dL   Calcium 8.7 (L) 8.9 - 10.3 mg/dL   GFR calc non Af Amer >60 >60 mL/min   GFR calc Af Amer >60 >60 mL/min    Comment: (NOTE) The eGFR has been calculated using the CKD EPI equation. This calculation has not been validated in all clinical situations. eGFR's persistently <60 mL/min signify possible Chronic Kidney Disease.    Anion gap 5 5 - 15  CBC     Status: Abnormal   Collection Time: 12/12/16  4:22 AM  Result Value Ref Range   WBC 7.8 4.0 - 10.5 K/uL   RBC 3.55 (L) 3.87 - 5.11 MIL/uL   Hemoglobin 10.8 (L) 12.0 - 15.0 g/dL   HCT 31.9 (L) 36.0 - 46.0 %   MCV 89.9 78.0 - 100.0 fL   MCH 30.4 26.0  - 34.0 pg   MCHC 33.9 30.0 - 36.0 g/dL   RDW 15.0 11.5 - 15.5 %   Platelets 208 150 - 400 K/uL    Imaging / Studies: No results found.  Medications / Allergies:  per chart  Antibiotics: Anti-infectives    Start     Dose/Rate Route Frequency Ordered Stop   12/10/16 2200  cefoTEtan (CEFOTAN) 2 g in dextrose 5 % 50 mL IVPB     2 g 100 mL/hr over 30 Minutes Intravenous Every 12 hours 12/10/16 1610 12/10/16 2226   12/10/16 0750  cefoTEtan in Dextrose 5% (CEFOTAN) IVPB 2 g     2 g Intravenous On call to O.R. 12/10/16 0750 12/10/16 1100        Note: Portions of this report may have been transcribed using voice recognition software. Every effort was made to ensure accuracy; however, inadvertent computerized transcription errors may be present.   Any transcriptional errors that result from this process are unintentional.     Adin Hector, M.D., F.A.C.S. Gastrointestinal and Minimally Invasive Surgery [facility] [address] [place], [place] [address] [phone] Main / Paging   12/12/2016

## 2016-12-13 LAB — BASIC METABOLIC PANEL
ANION GAP: 8 (ref 5–15)
BUN: 9 mg/dL (ref 6–20)
CHLORIDE: 103 mmol/L (ref 101–111)
CO2: 28 mmol/L (ref 22–32)
Calcium: 8.8 mg/dL — ABNORMAL LOW (ref 8.9–10.3)
Creatinine, Ser: 0.84 mg/dL (ref 0.44–1.00)
GFR calc Af Amer: >60 mL/min (ref >60)
Glucose, Bld: 111 mg/dL — ABNORMAL HIGH (ref 65–99)
POTASSIUM: 3.5 mmol/L (ref 3.5–5.1)
SODIUM: 139 mmol/L (ref 135–145)

## 2016-12-13 LAB — CBC
HEMATOCRIT: 32.7 % — AB (ref 36.0–46.0)
HEMOGLOBIN: 10.8 g/dL — AB (ref 12.0–15.0)
MCH: 29.5 pg (ref 26.0–34.0)
MCHC: 33 g/dL (ref 30.0–36.0)
MCV: 89.3 fL (ref 78.0–100.0)
Platelets: 225 10*3/uL (ref 150–400)
RBC: 3.66 MIL/uL — ABNORMAL LOW (ref 3.87–5.11)
RDW: 15 % (ref 11.5–15.5)
WBC: 8.9 10*3/uL (ref 4.0–10.5)

## 2016-12-13 MED ORDER — TRAMADOL HCL 50 MG PO TABS: 50.0000 mg | ORAL_TABLET | Freq: Four times a day (QID) | ORAL | 0 refills | Status: DC | PRN

## 2016-12-13 MED ORDER — METHOCARBAMOL 500 MG PO TABS: 500.0000 mg | ORAL_TABLET | Freq: Four times a day (QID) | ORAL | 2 refills | Status: DC | PRN

## 2016-12-13 NOTE — Discharge Summary (Signed)
Physician Discharge Summary  Patient ID: [name] [name] MRN: [id number] DOB/AGE: Jun 10, 1960  56 y.o.  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  Postoperatively, the patient gradually mobilized and advanced to a solid diet.  Pain and other symptoms were treated aggressively.    By the time of discharge, the patient was walking well the hallways, eating food, having flatus.  Pain was well-controlled on an oral medications.  Based on meeting discharge criteria and continuing to recover, I felt it was safe for the patient to be discharged from the hospital to further recover with close followup. Postoperative recommendations were discussed in detail.  They are written as well.  Discharged Condition: good  Disposition:  Follow-up Information    Leighton Ruff, MD. Schedule an appointment as soon as possible for a visit in 2 week(s).   Specialty:  General Surgery Contact information: [address] [place] [address] [phone]           [facility]  Discharge Instructions    Call MD for:    Complete by:  As directed    FEVER > 101.5  F (Temperatures <101.69F can occasionally happen and are not significant)   Call MD for:  extreme fatigue    Complete by:  As directed    Call MD for:  persistant dizziness or light-headedness    Complete by:  As directed    Call MD for:  persistant nausea and vomiting    Complete by:  As directed    Call MD for:  redness, tenderness, or signs of infection (pain, swelling, redness, odor or green/yellow discharge around incision site)    Complete by:  As directed    Call MD for:  severe uncontrolled pain    Complete by:  As directed    Diet - low sodium heart healthy    Complete by:  As directed    Follow a light diet the first few days at home.    If you feel full, bloated, or constipated, stay on a liquid diet until you feel better and not constipated. Gradually get back to a solid diet.  Avoid fast food or heavy meals the first week as you are more likely to get nauseated. It is expected for your digestive tract to need a few months to get back to normal.   Discharge wound care:    Complete by:  As directed    You have an open wound. I  In general, it is encouraged that when you remove your dressing and packing; then, shower with soap & water, and replace your dressing (Large BandAid or clean gauze) once a day.    Pressure on the dressing for 30 minutes will stop  most wound bleeding.   Eventually your body will heal & pull the open wound closed over the next few weeks.  Raw open wounds will occasionally bleed or secrete yellow drainage until it heals closed.   Drain sites will drain a little until the drain is removed.   Even closed incisions can have mild bleeding or drainage the first few days until the skin edges scab over & seal.   Driving Restrictions    Complete by:  As directed    You may drive when you are no longer taking narcotic prescription pain medication, you can comfortably wear a seatbelt, and you can safely make sudden turns/stops to protect yourself without  hesitating due to pain.   Increase activity slowly    Complete by:  As directed    Lifting restrictions    Complete by:  As directed    Start light daily activities --- self-care, walking, climbing stairs- beginning the day after surgery.   Gradually increase activities as tolerated.   Control your pain to be active.   Stop when you are tired.   Ideally, walk several times a day, eventually an hour a day.   Most people are back to most day-to-day activities in a few weeks.  It takes 4-8 weeks to get back to unrestricted, intense activity. If you can walk 30 minutes without difficulty, it is safe to try more intense activity such as jogging, treadmill, bicycling, low-impact aerobics, swimming, etc. Save the most intensive and strenuous activity for last (Usually 4-8 weeks after surgery) such as sit-ups, heavy lifting, contact sports, etc.   Refrain from any intense heavy lifting or straining until you are off narcotics for pain control.  You will have off days, but things should improve week-by-week. DO NOT PUSH THROUGH PAIN.   Let pain be your guide: If it hurts to do something, don't do it.  Pain is your body warning you to avoid that activity for another week until the pain goes down.   May shower / Bathe    Complete by:  As directed    May walk up steps    Complete by:  As directed    Sexual Activity Restrictions    Complete by:  As directed    You may have sexual intercourse when it is comfortable. If it hurts to do something, stop.      Allergies as of 12/13/2016      Reactions   Dilaudid [hydromorphone Hcl] Itching   Quinapril Hcl Hives, Swelling   Yellow Jacket Venom [bee Venom] Swelling   Swelling at site of bite      Medication List    TAKE these medications   [employer] FIBER GUMMY BEARS CHILDREN PO Take 2 Pieces by mouth daily.   fluticasone 50 MCG/ACT nasal spray Commonly known as:  FLONASE Place 1 spray into both nostrils daily as needed for allergies.   loratadine  10 MG tablet Commonly known as:  CLARITIN Take 10 mg by mouth daily as needed (seasonal allergies).   methocarbamol 500 MG tablet Commonly known as:  ROBAXIN Take 1 tablet (500 mg total) by mouth every 6 (six) hours as needed for muscle spasms (May substitute for 755m depending on availability).   pantoprazole 40 MG tablet Commonly known as:  PROTONIX Take 40 mg by mouth daily. Reported on 06/14/2015   rOPINIRole 0.25 MG tablet Commonly known as:  REQUIP Take 0.25 mg by mouth at bedtime.   sertraline 100 MG tablet Commonly known as:  ZOLOFT Take 100 mg by mouth daily.   traMADol 50 MG tablet Commonly known as:  ULTRAM Take 1-2 tablets (50-100 mg total) by mouth every 6 (six) hours as needed (mild pain).   triamterene-hydrochlorothiazide 37.5-25 MG tablet Commonly known as:  MAXZIDE-25 Take 1 tablet by mouth daily.   Vitamin D3 1000 units Caps Take 1,000 Units by mouth daily.            Discharge Care Instructions        Start     Ordered   12/13/16 0000  Discharge wound care:    Comments:  You have an open wound. I  In general, it is encouraged that when you remove your dressing and packing; then, shower with soap & water, and replace your dressing (Large BandAid or clean gauze) once a day.    Pressure on the dressing for 30 minutes will stop most wound bleeding.   Eventually your body will heal & pull the open wound closed over the next few weeks.  Raw open wounds will occasionally bleed or secrete yellow drainage until it heals closed.   Drain sites will drain a little until the drain is removed.   Even closed incisions can have mild bleeding or drainage the first few days until the skin edges scab over & seal.   12/13/16 0831      Significant Diagnostic Studies:  Results for orders placed or performed during the hospital encounter of 12/10/16 (from the past 72 hour(s))  Basic metabolic panel     Status: Abnormal   Collection Time: 12/11/16  5:24 AM   Result Value Ref Range   Sodium 138 135 - 145 mmol/L   Potassium 2.8 (L) 3.5 - 5.1 mmol/L   Chloride 102 101 - 111 mmol/L   CO2 27 22 - 32 mmol/L   Glucose, Bld 120 (H) 65 - 99 mg/dL   BUN 12 6 - 20 mg/dL   Creatinine, Ser 0.91 0.44 - 1.00 mg/dL   Calcium 8.5 (L) 8.9 - 10.3 mg/dL   GFR calc non Af Amer >60 >60 mL/min   GFR calc Af Amer >60 >60 mL/min    Comment: (NOTE) The eGFR has been calculated using the CKD EPI equation. This calculation has not been validated in all clinical situations. eGFR's persistently <60 mL/min signify possible Chronic Kidney Disease.    Anion gap 9 5 - 15  CBC     Status: Abnormal   Collection Time: 12/11/16  5:24 AM  Result Value Ref Range   WBC 9.7 4.0 - 10.5 K/uL   RBC 3.80 (L) 3.87 - 5.11 MIL/uL   Hemoglobin 11.1 (L) 12.0 - 15.0 g/dL   HCT 33.4 (L) 36.0 - 46.0 %   MCV 87.9 78.0 - 100.0 fL   MCH 29.2 26.0 - 34.0 pg   MCHC 33.2 30.0 - 36.0 g/dL   RDW 14.7 11.5 - 15.5 %   Platelets 219 150 - 400 K/uL  Basic metabolic panel     Status: Abnormal   Collection Time: 12/11/16  2:18 PM  Result Value Ref Range   Sodium 142 135 - 145 mmol/L   Potassium 4.0 3.5 - 5.1 mmol/L    Comment: DELTA CHECK NOTED REPEATED TO VERIFY NO VISIBLE HEMOLYSIS    Chloride 107 101 - 111 mmol/L   CO2 27 22 - 32 mmol/L   Glucose, Bld 113 (H) 65 - 99 mg/dL   BUN 9 6 - 20 mg/dL   Creatinine, Ser 0.94 0.44 -  1.00 mg/dL   Calcium 8.6 (L) 8.9 - 10.3 mg/dL   GFR calc non Af Amer >60 >60 mL/min   GFR calc Af Amer >60 >60 mL/min    Comment: (NOTE) The eGFR has been calculated using the CKD EPI equation. This calculation has not been validated in all clinical situations. eGFR's persistently <60 mL/min signify possible Chronic Kidney Disease.    Anion gap 8 5 - 15  Basic metabolic panel     Status: Abnormal   Collection Time: 12/12/16  4:22 AM  Result Value Ref Range   Sodium 141 135 - 145 mmol/L   Potassium 3.6 3.5 - 5.1 mmol/L   Chloride 108 101 - 111 mmol/L    CO2 28 22 - 32 mmol/L   Glucose, Bld 107 (H) 65 - 99 mg/dL   BUN 9 6 - 20 mg/dL   Creatinine, Ser 0.86 0.44 - 1.00 mg/dL   Calcium 8.7 (L) 8.9 - 10.3 mg/dL   GFR calc non Af Amer >60 >60 mL/min   GFR calc Af Amer >60 >60 mL/min    Comment: (NOTE) The eGFR has been calculated using the CKD EPI equation. This calculation has not been validated in all clinical situations. eGFR's persistently <60 mL/min signify possible Chronic Kidney Disease.    Anion gap 5 5 - 15  CBC     Status: Abnormal   Collection Time: 12/12/16  4:22 AM  Result Value Ref Range   WBC 7.8 4.0 - 10.5 K/uL   RBC 3.55 (L) 3.87 - 5.11 MIL/uL   Hemoglobin 10.8 (L) 12.0 - 15.0 g/dL   HCT 31.9 (L) 36.0 - 46.0 %   MCV 89.9 78.0 - 100.0 fL   MCH 30.4 26.0 - 34.0 pg   MCHC 33.9 30.0 - 36.0 g/dL   RDW 15.0 11.5 - 15.5 %   Platelets 208 150 - 400 K/uL  Basic metabolic panel     Status: Abnormal   Collection Time: 12/13/16  5:00 AM  Result Value Ref Range   Sodium 139 135 - 145 mmol/L   Potassium 3.5 3.5 - 5.1 mmol/L   Chloride 103 101 - 111 mmol/L   CO2 28 22 - 32 mmol/L   Glucose, Bld 111 (H) 65 - 99 mg/dL   BUN 9 6 - 20 mg/dL   Creatinine, Ser 0.84 0.44 - 1.00 mg/dL   Calcium 8.8 (L) 8.9 - 10.3 mg/dL   GFR calc non Af Amer >60 >60 mL/min   GFR calc Af Amer >60 >60 mL/min    Comment: (NOTE) The eGFR has been calculated using the CKD EPI equation. This calculation has not been validated in all clinical situations. eGFR's persistently <60 mL/min signify possible Chronic Kidney Disease.    Anion gap 8 5 - 15  CBC     Status: Abnormal   Collection Time: 12/13/16  5:00 AM  Result Value Ref Range   WBC 8.9 4.0 - 10.5 K/uL   RBC 3.66 (L) 3.87 - 5.11 MIL/uL   Hemoglobin 10.8 (L) 12.0 - 15.0 g/dL   HCT 32.7 (L) 36.0 - 46.0 %   MCV 89.3 78.0 - 100.0 fL   MCH 29.5 26.0 - 34.0 pg   MCHC 33.0 30.0 - 36.0 g/dL   RDW 15.0 11.5 - 15.5 %   Platelets 225 150 - 400 K/uL    No results found.  Discharge Exam: Blood  pressure 126/78, pulse 62, temperature 98.3 F (36.8 C), temperature source Oral, resp. rate 16, height 5'  5.5" (1.664 m), weight 108.6 kg (239 lb 6.4 oz), SpO2 95 %.  General: Pt awake/alert/oriented x4 in No acute distress Eyes: PERRL, normal EOM.  Sclera clear.  No icterus Neuro: CN II-XII intact w/o focal sensory/motor deficits. Lymph: No head/neck/groin lymphadenopathy Psych:  No delerium/psychosis/paranoia HENT: Normocephalic, Mucus membranes moist.  No thrush Neck: Supple, No tracheal deviation Chest: No chest wall pain w good excursion CV:  Pulses intact.  Regular rhythm MS: Normal AROM mjr joints.  No obvious deformity Abdomen: Soft.  Nondistended.  Mildly tender at incisions only.  Ostomy wound wick removed - clean.  No evidence of peritonitis.  No incarcerated hernias. Ext:  SCDs BLE.  No mjr edema.  No cyanosis Skin: No petechiae / purpura  Past Medical History:  Diagnosis Date  . Anxiety disorder   . 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 Diverticulitis   . Gallstones   . GERD (gastroesophageal reflux disease)   . History of kidney stones   . Hyperlipidemia   . Hypertension   . Obesity   . Pneumonia    walking pneumonia historical   . Renal disorder    kidney stones  . Rhinitis   . 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 CESAREAN SECTION    . COLECTOMY WITH COLOSTOMY CREATION/HARTMANN PROCEDURE N/A 06/10/2016   Procedure: OPEN SIGMOID COLECTOMY WITH DRAINAGE OF ABDOMINAL ABSCESS AND MOBILIZATION OF SPLENIC FLEXURE;  Surgeon: Georganna Skeans, MD;  Location: [facility];  Service: General;  Laterality: N/A;  . COLOSTOMY N/A 06/10/2016   Procedure: COLOSTOMY WITH HARTMANN PROCEDURE;  Surgeon: Georganna Skeans, MD;  Location: [facility];  Service: General;  Laterality: N/A;  . COLOSTOMY TAKEDOWN N/A 12/10/2016   Procedure: LAPAROSCOPIC COLOSTOMY REVERSAL;  Surgeon:  Leighton Ruff, MD;  Location: [facility];  Service: General;  Laterality: N/A;  . LAPAROSCOPIC CHOLECYSTECTOMY W/ CHOLANGIOGRAPHY  2012   Dr Georganna Skeans  . TONSILLECTOMY     as child-tonsils and adenoids  . TOTAL KNEE ARTHROPLASTY  11/23/2011   Dr Alvan Dame.  Procedure: TOTAL KNEE ARTHROPLASTY;  Surgeon: Mauri Pole, MD;  Location: [facility];  Service: Orthopedics;  Laterality: Right;  . uterine ablation  2003    Social History   Social History  . Marital status: Married    Spouse name: N/A  . Number of children: 1  . Years of education: N/A   Occupational History  . [employer]   Social History Main Topics  . Smoking status: Never Smoker  . Smokeless tobacco: Never Used  . Alcohol use No  . Drug use: No  . Sexual activity: Yes     Comment: ablation   Other Topics Concern  . Not on file   Social History Narrative   Daily caffeine     Family History  Problem Relation Age of Onset  . Hypertension Mother   . Bone cancer Father   . Bladder Cancer Father   . Colon cancer Neg Hx     Current Facility-Administered Medications  Medication Dose Route Frequency Provider Last Rate Last Dose  . 0.9 %  sodium chloride infusion  250 mL Intravenous PRN Michael [place], MD      . acetaminophen (TYLENOL) tablet 1,000 mg  1,000 mg Oral T6Y Leighton Ruff, MD   5,638 mg at 12/13/16 0751  . alum & mag hydroxide-simeth (MAALOX/MYLANTA) 200-200-20 MG/5ML suspension 30 mL  30 mL Oral W9Q PRN Leighton Ruff, MD      . alvimopan (ENTEREG) capsule 12 mg  12 mg Oral BID Leighton Ruff, MD   12 mg at 12/12/16 1010  . cholecalciferol (VITAMIN D) tablet 1,000 Units  1,000 Units Oral Daily Michael [place], MD   1,000 Units at 12/12/16 1314  . dextrose 5 % and 0.45 % NaCl with KCl 20 mEq/L infusion   Intravenous Continuous Leighton Ruff, MD 20 mL/hr at 12/12/16 1422    . diphenhydrAMINE (BENADRYL) 12.5 MG/5ML elixir 12.5 mg  12.5 mg Oral P5F PRN Leighton Ruff, MD       Or  .  diphenhydrAMINE (BENADRYL) injection 12.5 mg  12.5 mg Intravenous F6B PRN Leighton Ruff, MD      . enoxaparin (LOVENOX) injection 40 mg  40 mg Subcutaneous W46K Leighton Ruff, MD   40 mg at 12/13/16 5993  . fluticasone (FLONASE) 50 MCG/ACT nasal spray 1 spray  1 spray Each Nare Daily PRN Michael [place], MD      . gabapentin (NEURONTIN) capsule 300 mg  300 mg Oral BID Leighton Ruff, MD   570 mg at 12/12/16 2141  . guaiFENesin-dextromethorphan (ROBITUSSIN DM) 100-10 MG/5ML syrup 10 mL  10 mL Oral Q4H PRN [place], MD      . hydrALAZINE (APRESOLINE) injection 5-20 mg  5-20 mg Intravenous Q6H PRN Michael [place], MD      . hydrocortisone (ANUSOL-HC) 2.5 % rectal cream 1 application  1 application Topical QID PRN Michael [place], MD      . hydrocortisone cream 1 % 1 application  1 application Topical TID PRN [place], MD      . HYDROmorphone (DILAUDID) injection 0.5 mg  0.5 mg Intravenous V7B PRN Leighton Ruff, MD      . lactated ringers bolus 1,000 mL  1,000 mL Intravenous Q8H PRN [place], MD      . lip balm (CARMEX) ointment 1 application  1 application Topical BID Michael [place], MD      . loratadine (CLARITIN) tablet 10 mg  10 mg Oral Daily PRN [place], MD      . menthol-cetylpyridinium (CEPACOL) lozenge 3 mg  1 lozenge Oral PRN Michael [place], MD      . methocarbamol (ROBAXIN) tablet 1,000 mg  1,000 mg Oral Tor Netters, MD   1,000 mg at 12/13/16 0751  . metoprolol tartrate (LOPRESSOR) injection 5 mg  5 mg Intravenous Q6H PRN Michael [place], MD      . ondansetron [facility]) tablet 4 mg  4 mg Oral L3J PRN Leighton Ruff, MD       Or  . ondansetron [facility]) injection 4 mg  4 mg Intravenous Q3E PRN Leighton Ruff, MD      . pantoprazole (PROTONIX) EC tablet 40 mg  40 mg Oral Daily Leighton Ruff, MD   40 mg at 12/12/16 1010  . phenol (CHLORASEPTIC) mouth spray 1-2 spray  1-2 spray Mouth/Throat PRN Michael [place], MD      . prochlorperazine (COMPAZINE) injection 5-10 mg  5-10 mg  Intravenous Q4H PRN Michael [place], MD      . psyllium (HYDROCIL/METAMUCIL) packet 1 packet  1 packet Oral Daily Michael [place], MD   1 packet at 12/12/16 1315  . rOPINIRole (REQUIP) tablet 0.25 mg  0.25 mg Oral QHS Leighton Ruff, MD   0.92 mg at 12/12/16 2140  . saccharomyces boulardii (FLORASTOR) capsule 250 mg  250 mg Oral BID Leighton Ruff, MD   330 mg at  12/12/16 2141  . sertraline (ZOLOFT) tablet 100 mg  100 mg Oral Daily Leighton Ruff, MD   672 mg at 12/12/16 1010  . sodium chloride flush (NS) 0.9 % injection 3 mL  3 mL Intravenous Gorden Harms, MD      . sodium chloride flush (NS) 0.9 % injection 3 mL  3 mL Intravenous PRN Michael [place], MD      . traMADol Veatrice Bourbon) tablet 50-100 mg  50-100 mg Oral C9O PRN Leighton Ruff, MD   709 mg at 12/13/16 0333  . triamterene-hydrochlorothiazide (MAXZIDE-25) 37.5-25 MG per tablet 1 tablet  1 tablet Oral Daily Leighton Ruff, MD   1 tablet at 12/12/16 1010     Allergies  Allergen Reactions  . Dilaudid [Hydromorphone Hcl] Itching  . Quinapril Hcl Hives and Swelling  . Yellow Jacket Venom [Bee Venom] Swelling    Swelling at site of bite    Signed: Morton Peters, M.D., F.A.C.S. Gastrointestinal and Minimally Invasive Surgery [facility] [address], [place], [place] [address] [phone] Main / Paging   12/13/2016, 8:34 AM

## 2016-12-13 NOTE — Progress Notes (Signed)
Patient was given discharge instructions and prescription, she verbalized understanding. IV was removed. Tech took patient in wheelchair to her car. Patient's husband drove her home.

## 2016-12-15 ENCOUNTER — Encounter (HOSPITAL_COMMUNITY): Payer: Self-pay | Admitting: General Surgery

## 2017-03-12 IMAGING — CT CT ABD-PELV W/O CM
2 of 4 series · 17 of 46 positions shown, 19 images · non-contrast
Comparison: 08/25/2012

CLINICAL DATA: Left lower quadrant pain

EXAM:
CT ABDOMEN AND PELVIS WITHOUT CONTRAST
TECHNIQUE: Multidetector CT imaging of the abdomen and pelvis was performed
following the standard protocol without IV contrast.

[Series 2: abd/pel w/o · axial · non-contrast · 0.98mm/px · z∈[+979,+1399]mm · 14 of 96 slices shown, 16 images]
[im 6/96  soft-tissue]
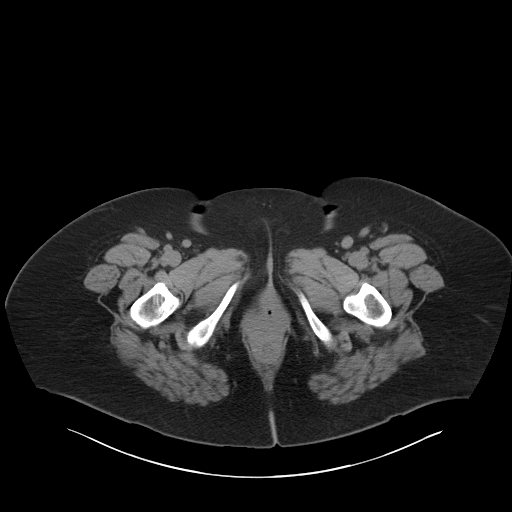
[im 6/96  bone]
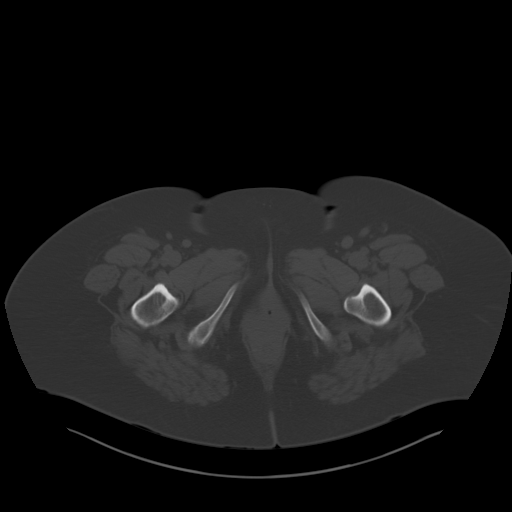
[im 11/96  soft-tissue]
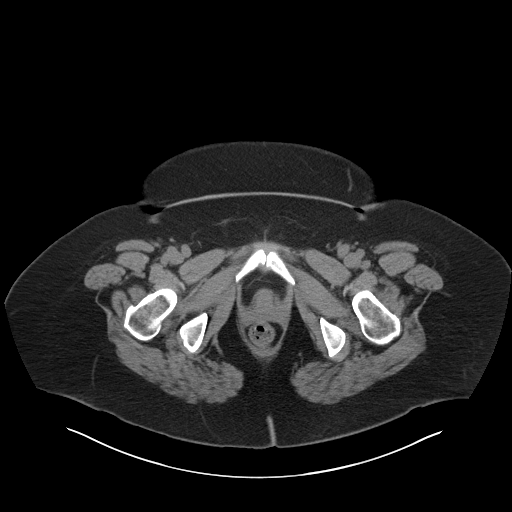
[im 22/96  soft-tissue]
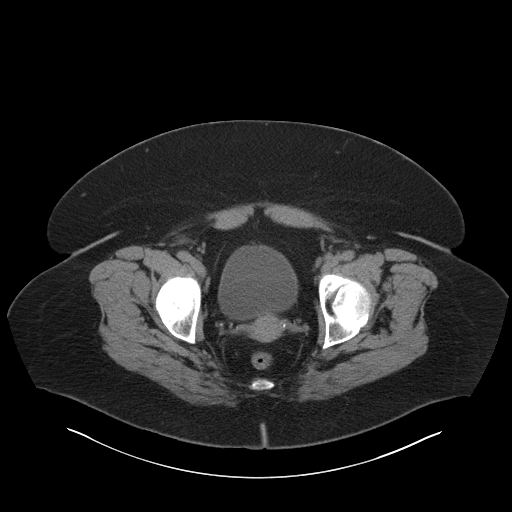
[im 27/96  soft-tissue]
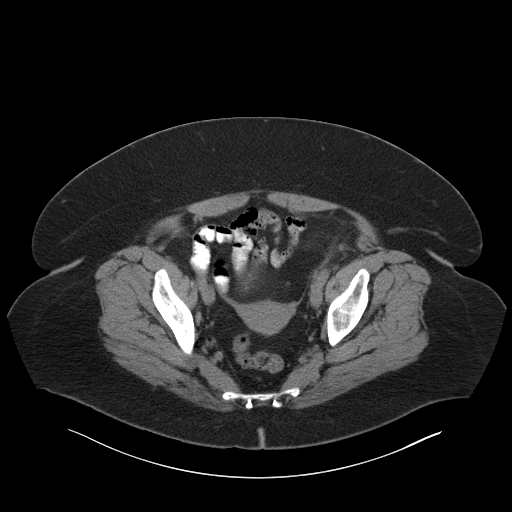
[im 32/96  soft-tissue]
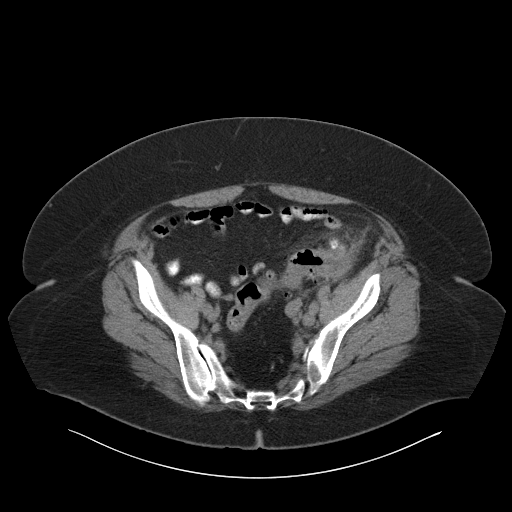
[im 37/96  soft-tissue]
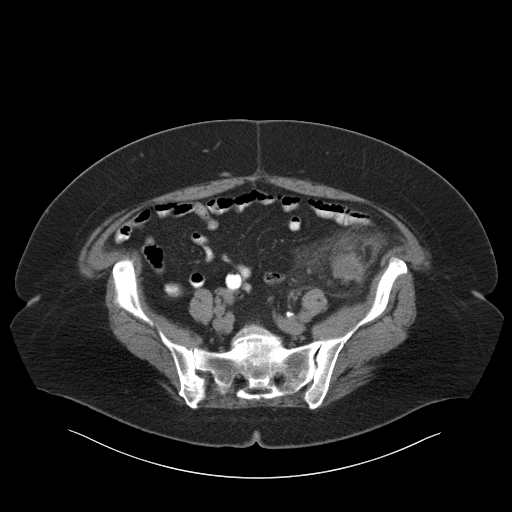
[im 43/96  soft-tissue]
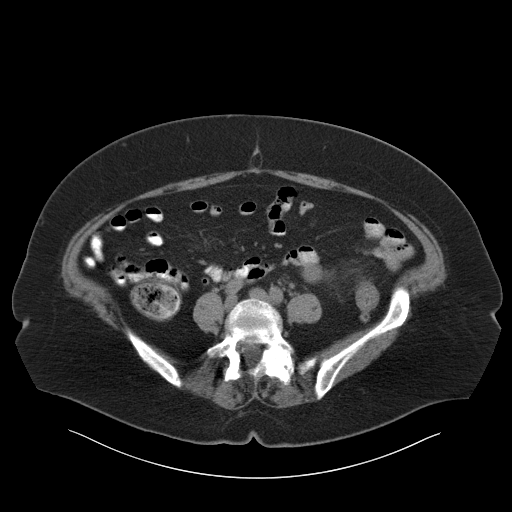
[im 53/96  soft-tissue]
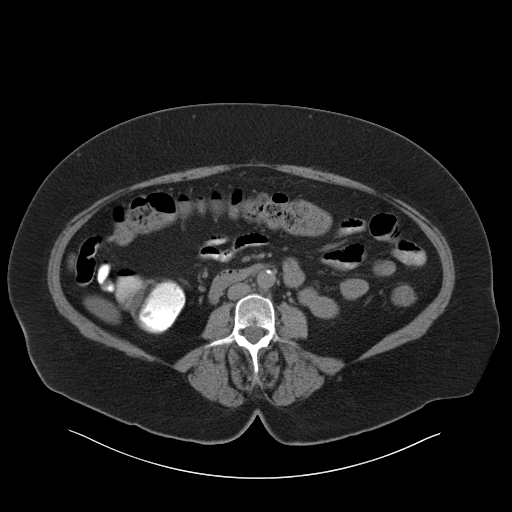
[im 59/96  soft-tissue]
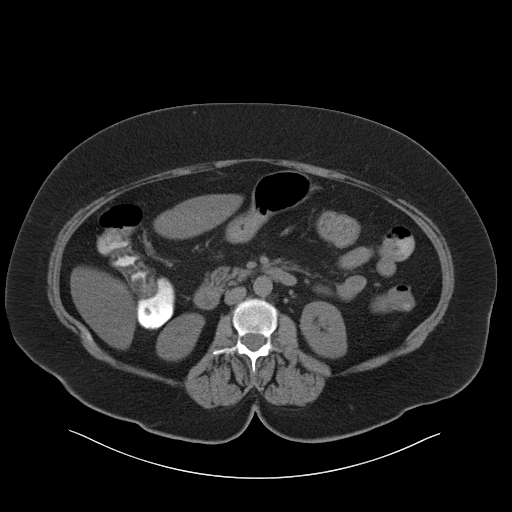
[im 59/96  bone]
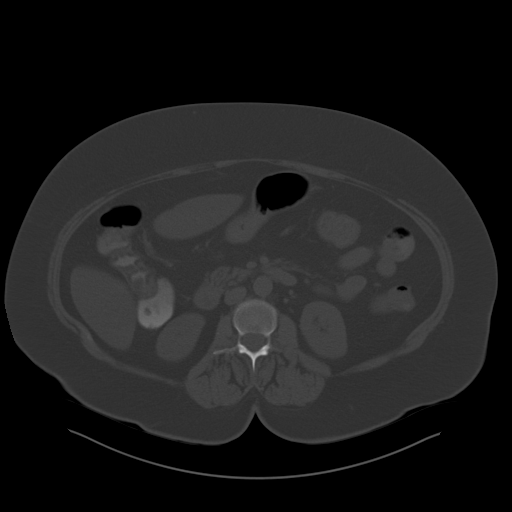
[im 64/96  soft-tissue]
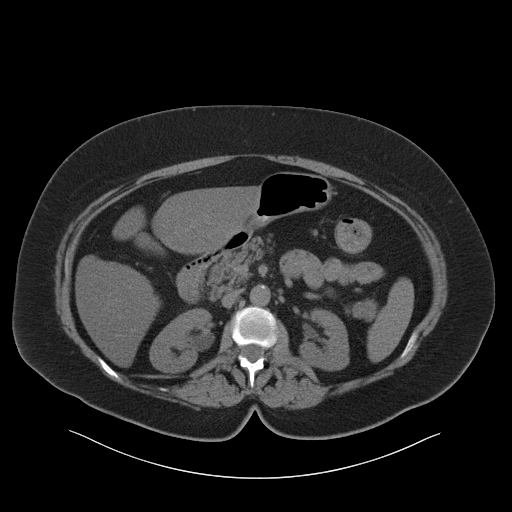
[im 69/96  soft-tissue]
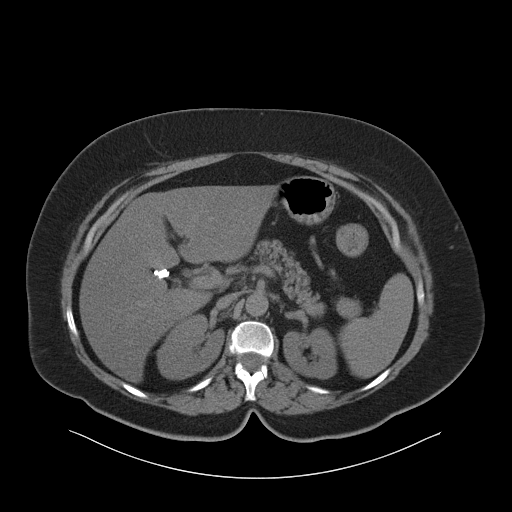
[im 74/96  soft-tissue]
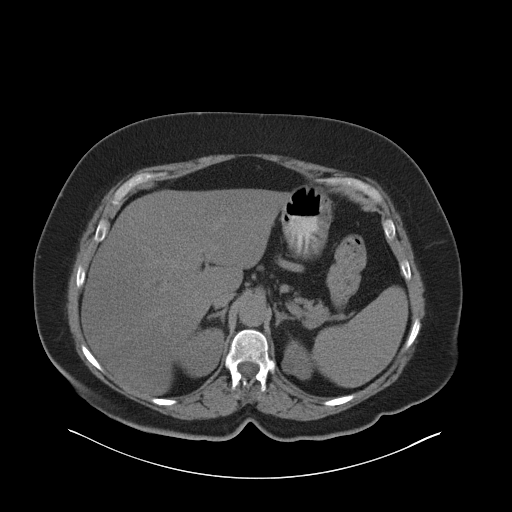
[im 85/96  soft-tissue]
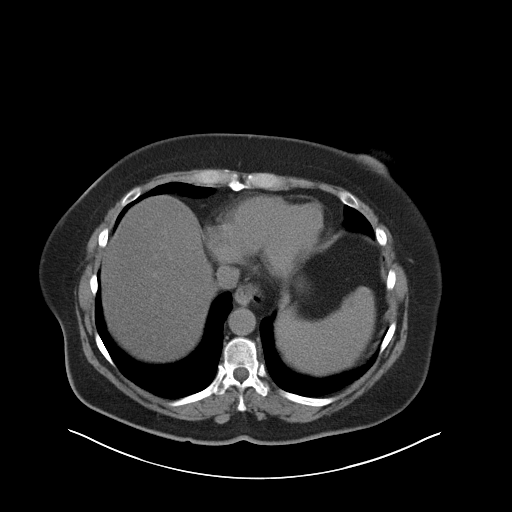
[im 90/96  soft-tissue]
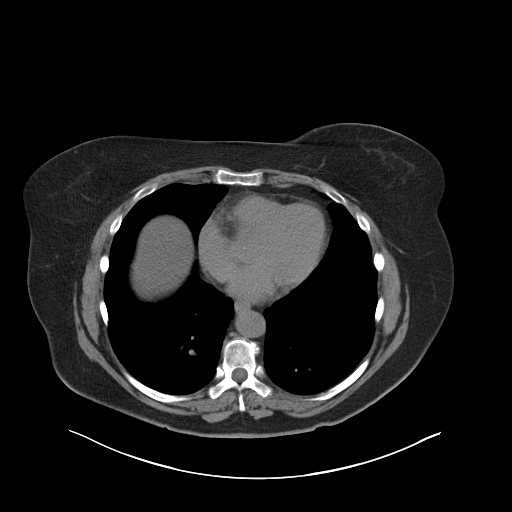

[Series 3: coronal · coronal · 0.90mm/px · 3 of 191 slices shown]
[im 64/191  soft-tissue]
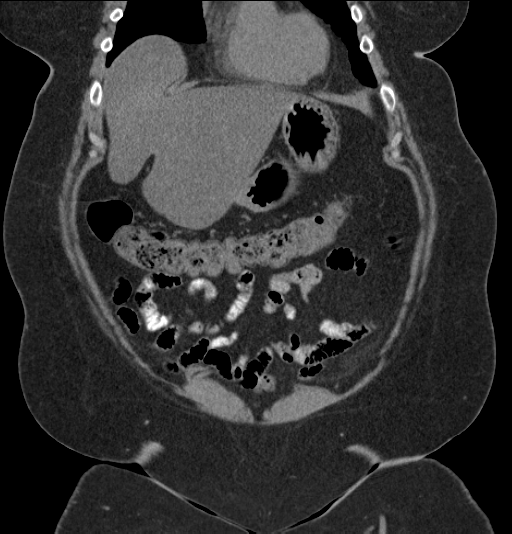
[im 85/191  soft-tissue]
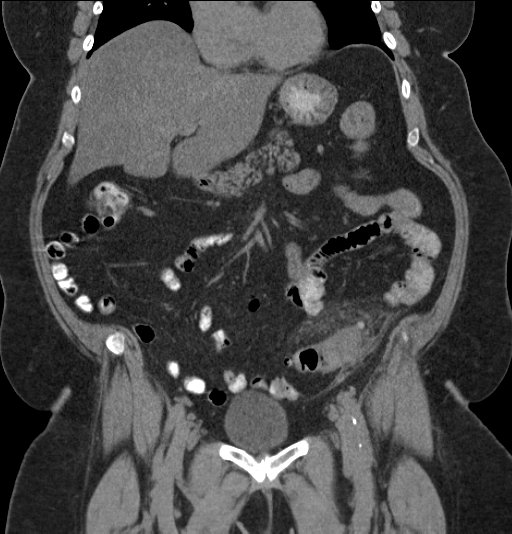
[im 106/191  soft-tissue]
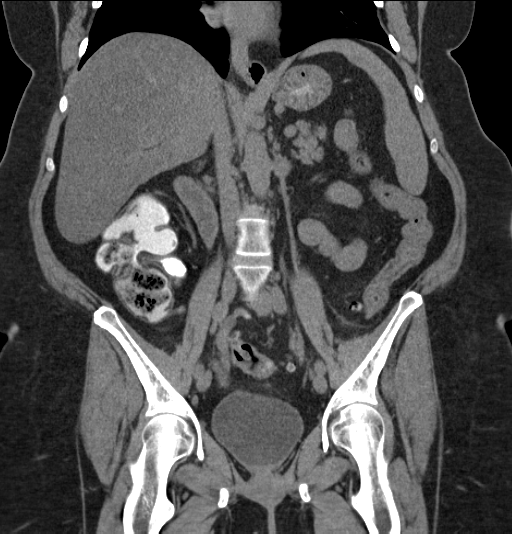

[17 of 46 positions shown; findings below may reference images not displayed]

FINDINGS: Lung bases are free of acute infiltrate or sizable effusion. The
gallbladder has been surgically removed. The liver is diffusely
fatty infiltrated. The spleen, adrenal glands and pancreas are
stable in appearance. A hypodense lesion within the spleen is again
noted likely representing a splenic hemangioma. The kidneys are well
visualized without renal calculi or urinary tract obstructive
changes.

The appendix is within normal limits. There are changes of
diverticular disease within the left colon with evidence of
pericolonic inflammatory change consistent with acute
diverticulitis. No perforation or pericolonic abscess is identified.
The bladder is well distended. No pelvic mass lesion is seen. The
osseous structures show diffuse degenerative changes of lumbar
spine.
IMPRESSION: Changes consistent with acute diverticulitis without perforation or
abscess formation.

Chronic changes similar to that seen on prior exam.

## 2017-10-22 ENCOUNTER — Other Ambulatory Visit: Payer: Self-pay | Admitting: Internal Medicine

## 2017-10-22 DIAGNOSIS — E781 Pure hyperglyceridemia: Secondary | ICD-10-CM

## 2018-04-18 ENCOUNTER — Other Ambulatory Visit: Payer: Self-pay | Admitting: Internal Medicine

## 2018-04-18 ENCOUNTER — Ambulatory Visit
Admission: RE | Admit: 2018-04-18 | Discharge: 2018-04-18 | Disposition: A | Discharge status: Home / Self Care | Payer: BC Managed Care – PPO | Source: Ambulatory Visit | Attending: Internal Medicine | Admitting: Internal Medicine

## 2018-04-18 DIAGNOSIS — R1032 Left lower quadrant pain: Secondary | ICD-10-CM

## 2018-04-18 MED ORDER — IOPAMIDOL (ISOVUE-300) INJECTION 61%
100.0000 mL | Freq: Once | INTRAVENOUS | Status: AC | PRN
  Administered 2018-04-18: 100 mL via INTRAVENOUS

## 2019-02-07 ENCOUNTER — Other Ambulatory Visit: Payer: Self-pay | Admitting: Surgery

## 2019-03-13 ENCOUNTER — Other Ambulatory Visit (HOSPITAL_COMMUNITY): Payer: BC Managed Care – PPO

## 2019-03-13 ENCOUNTER — Inpatient Hospital Stay (HOSPITAL_COMMUNITY): Admission: RE | Admit: 2019-03-13 | Payer: BC Managed Care – PPO | Source: Ambulatory Visit

## 2019-03-17 NOTE — Patient Instructions (Signed)
DUE TO COVID-19 ONLY ONE VISITOR IS ALLOWED TO COME WITH YOU AND STAY IN THE WAITING ROOM ONLY DURING PRE OP AND PROCEDURE DAY OF SURGERY. THE 1 VISITOR MAY VISIT WITH YOU AFTER SURGERY IN YOUR PRIVATE ROOM DURING VISITING HOURS ONLY!  YOU NEED TO HAVE A COVID 19 TEST ON: 03/20/19 @ 2:15 PM, THIS TEST MUST BE DONE BEFORE SURGERY, COME  [name] [name], [place] , [id number].  ([place]) ONCE YOUR COVID TEST IS COMPLETED, PLEASE BEGIN THE QUARANTINE INSTRUCTIONS AS OUTLINED IN YOUR HANDOUT.                [name] [name]     Your procedure is scheduled on: 03/23/2019   Report to [facility] Main  Entrance   Report to admitting at: 6:45 AM     Call this number if you have problems the morning of surgery [phone]    Remember: Do not eat food or drink liquids :After Midnight.   BRUSH YOUR TEETH MORNING OF SURGERY AND RINSE YOUR MOUTH OUT, NO CHEWING GUM CANDY OR MINTS.     Take these medicines the morning of surgery with A SIP OF WATER: loratadine,pantoprazole,sertalin.flonase as needed.                                 You may not have any metal on your body including hair pins and              piercings  Do not wear jewelry, make-up, lotions, powders or perfumes, deodorant             Do not wear nail polish on your fingernails.  Do not shave  48 hours prior to surgery.                Do not bring valuables to the hospital. [place].  Contacts, dentures or bridgework may not be worn into surgery.  Leave suitcase in the car. After surgery it may be brought to your room.     Patients discharged the day of surgery will not be allowed to drive home. IF YOU ARE HAVING SURGERY AND GOING HOME THE SAME DAY, YOU MUST HAVE AN ADULT TO DRIVE YOU HOME AND BE WITH YOU FOR 24 HOURS. YOU MAY GO HOME BY TAXI OR UBER OR ORTHERWISE, BUT AN ADULT MUST ACCOMPANY YOU HOME AND STAY WITH YOU FOR 24 HOURS.  Name and phone  number of your driver:  Special Instructions: N/A              Please read over the following fact sheets you were given: _____________________________________________________________________             [facility] - Preparing for Surgery Before surgery, you can play an important role.  Because skin is not sterile, your skin needs to be as free of germs as possible.  You can reduce the number of germs on your skin by washing with CHG (chlorahexidine gluconate) soap before surgery.  CHG is an antiseptic cleaner which kills germs and bonds with the skin to continue killing germs even after washing. Please DO NOT use if you have an allergy to CHG or antibacterial soaps.  If your skin becomes reddened/irritated stop using the CHG and inform your nurse when you arrive at Short Stay.  Do not shave (including legs and underarms) for at least 48 hours prior to the first CHG shower.  You may shave your face/neck. Please follow these instructions carefully:  1.  Shower with CHG Soap the night before surgery and the  morning of Surgery.  2.  If you choose to wash your hair, wash your hair first as usual with your  normal  shampoo.  3.  After you shampoo, rinse your hair and body thoroughly to remove the  shampoo.                           4.  Use CHG as you would any other liquid soap.  You can apply chg directly  to the skin and wash                       Gently with a scrungie or clean washcloth.  5.  Apply the CHG Soap to your body ONLY FROM THE NECK DOWN.   Do not use on face/ open                           Wound or open sores. Avoid contact with eyes, ears mouth and genitals (private parts).                       Wash face,  Genitals (private parts) with your normal soap.             6.  Wash thoroughly, paying special attention to the area where your surgery  will be performed.  7.  Thoroughly rinse your body with warm water from the neck down.  8.  DO NOT shower/wash with your normal soap after  using and rinsing off  the CHG Soap.                9.  Pat yourself dry with a clean towel.            10.  Wear clean pajamas.            11.  Place clean sheets on your bed the night of your first shower and do not  sleep with pets. Day of Surgery : Do not apply any lotions/deodorants the morning of surgery.  Please wear clean clothes to the hospital/surgery center.  FAILURE TO FOLLOW THESE INSTRUCTIONS MAY RESULT IN THE CANCELLATION OF YOUR SURGERY PATIENT SIGNATURE_________________________________  NURSE SIGNATURE__________________________________  ________________________________________________________________________

## 2019-03-20 ENCOUNTER — Encounter (HOSPITAL_COMMUNITY): Payer: Self-pay

## 2019-03-20 ENCOUNTER — Encounter (HOSPITAL_COMMUNITY)
Admission: RE | Admit: 2019-03-20 | Discharge: 2019-03-20 | Disposition: A | Discharge status: Home / Self Care | Payer: BC Managed Care – PPO | Source: Ambulatory Visit | Attending: Surgery | Admitting: Surgery

## 2019-03-20 ENCOUNTER — Other Ambulatory Visit (HOSPITAL_COMMUNITY): Payer: BC Managed Care – PPO

## 2019-03-20 ENCOUNTER — Other Ambulatory Visit (HOSPITAL_COMMUNITY)
Admission: RE | Admit: 2019-03-20 | Discharge: 2019-03-20 | Disposition: A | Discharge status: Home / Self Care | Payer: BC Managed Care – PPO | Source: Ambulatory Visit | Attending: Surgery | Admitting: Surgery

## 2019-03-20 ENCOUNTER — Other Ambulatory Visit: Payer: Self-pay

## 2019-03-20 DIAGNOSIS — Z20822 Contact with and (suspected) exposure to covid-19: Secondary | ICD-10-CM | POA: Insufficient documentation

## 2019-03-20 DIAGNOSIS — Z01818 Encounter for other preprocedural examination: Secondary | ICD-10-CM | POA: Diagnosis present

## 2019-03-20 HISTORY — DX: Depression, unspecified: F32.A

## 2019-03-20 LAB — BASIC METABOLIC PANEL
Anion gap: 10 (ref 5–15)
BUN: 25 mg/dL — ABNORMAL HIGH (ref 6–20)
CO2: 26 mmol/L (ref 22–32)
Calcium: 9.8 mg/dL (ref 8.9–10.3)
Chloride: 103 mmol/L (ref 98–111)
Creatinine, Ser: 1.02 mg/dL — ABNORMAL HIGH (ref 0.44–1.00)
GFR calc Af Amer: >60 mL/min (ref >60)
GFR calc non Af Amer: >60 mL/min (ref >60)
Glucose, Bld: 96 mg/dL (ref 70–99)
Potassium: 3.8 mmol/L (ref 3.5–5.1)
Sodium: 139 mmol/L (ref 135–145)

## 2019-03-20 LAB — CBC
HCT: 44.6 % (ref 36.0–46.0)
Hemoglobin: 14.5 g/dL (ref 12.0–15.0)
MCH: 29.2 pg (ref 26.0–34.0)
MCHC: 32.5 g/dL (ref 30.0–36.0)
MCV: 89.7 fL (ref 80.0–100.0)
Platelets: 247 10*3/uL (ref 150–400)
RBC: 4.97 MIL/uL (ref 3.87–5.11)
RDW: 14.3 % (ref 11.5–15.5)
WBC: 6.8 10*3/uL (ref 4.0–10.5)
nRBC: 0 % (ref 0.0–0.2)

## 2019-03-20 LAB — SARS CORONAVIRUS 2 (TAT 6-24 HRS): SARS Coronavirus 2: NEGATIVE

## 2019-03-20 NOTE — Progress Notes (Signed)
PCP - Shon Baton Cardiologist -   Chest x-ray -  EKG -  Stress Test -  ECHO -  Cardiac Cath -   Sleep Study -  CPAP -   Fasting Blood Sugar -  Checks Blood Sugar _____ times a day  Blood Thinner Instructions: Aspirin Instructions: Last Dose:  Anesthesia review:   Patient denies shortness of breath, fever, cough and chest pain at PAT appointment   Patient verbalized understanding of instructions that were given to them at the PAT appointment. Patient was also instructed that they will need to review over the PAT instructions again at home before surgery.

## 2019-03-22 MED ORDER — DEXTROSE 5 % IV SOLN
3.0000 g | INTRAVENOUS | Status: AC
  Administered 2019-03-23: 09:00:00 3 g via INTRAVENOUS
  Filled 2019-03-22: qty 3

## 2019-03-22 NOTE — H&P (Signed)
   [name] [name] = Location: [facility] Patient #: [id number] DOB: Jun 04, 1960 Married / Language: English / Race: White Female   History of Present Illness  The patient is a 59 year old female who presents with an incisional hernia. This patient is known to our group. 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 Grandville Silos in April 2018. She then underwent a laparoscopic colostomy reversal by Dr. Leighton Ruff in October 2018. She has had an increasing incisional hernia. She had a CT scan in February of this year showing 2 separate incisional hernias. One was in the periumbilical area and the other one was near the left lower abdomen. Both contained bowel. She has been having increasing mild to moderate cramping abdominal pain with occasional nausea but no vomiting over the last several months. She is still moving her bowels though is constipated.   Allergies Accupril *ANTIHYPERTENSIVES*  ACE Inhibitors  BEE VENOM  Dilaudid *ANALGESICS - OPIOID*  HYDROmorphone HCl *ANALGESICS - OPIOID*  Quinapril-Hydrochlorothiazide *ANTIHYPERTENSIVES*   Medication History  Sertraline HCl (100MG  Tablet, Oral) Active. Pantoprazole Sodium (40MG  Tablet DR, Oral) Active. Triamterene-HCTZ (37.5-25MG  Tablet, Oral) Active. Fluticasone Propionate (50MCG/ACT Suspension, Nasal) Active. ROPINIRole HCl (0.25MG  Tablet, Oral) Active. Maxzide-25 (37.5-25MG  Tablet, Oral) Active. Meloxicam (15MG  Tablet, Oral) Active. Claritin (10MG  Tablet, Oral) Active. Vitamin D (Cholecalciferol) (1000UNIT Capsule, Oral) Active. Medications Reconciled  Vitals  Weight: 264.6 lb Height: 65in Body Surface Area: 2.23 m Body Mass Index: 44.03 kg/m  Temp.: 97.60F  BP: 140/80 (Sitting, Left Arm, Standard)  Physical Exam  The physical exam findings are as follows: Note:On exam, she is morbidly obese. She  has a well-healed midline incision and ostomy incision. She has 2 separate reducible large incisional hernias but at the midline and the left lower quadrant. They are mildly tender. Lungs clear CV RRR Ext without e/c/c    Assessment & Pla INCISIONAL HERNIA (K43.2)  Impression: I discussed the diagnosis of hernias with her in detail. We discussed the reasons to proceed with hernia repair. I would recommend repair given her symptoms. We discussed use of mesh. We discussed both laparoscopic and open techniques. I would recommend a laparoscopic incisional hernia repair with mesh. I discussed the risk which include but not limited to bleeding, infection, injury to surrounding structures, the need to convert to an open procedure, hernia recurrence, cardiopulmonary issues, etc. She understands and wishes to proceed with surgery

## 2019-03-23 ENCOUNTER — Other Ambulatory Visit: Payer: Self-pay

## 2019-03-23 ENCOUNTER — Inpatient Hospital Stay (HOSPITAL_COMMUNITY)
Admission: RE | Admit: 2019-03-23 | Discharge: 2019-03-25 | DRG: 354 | Disposition: A | Discharge status: Home / Self Care | Payer: BC Managed Care – PPO | Attending: Surgery | Admitting: Surgery

## 2019-03-23 ENCOUNTER — Ambulatory Visit: Admit: 2019-03-23 | Payer: BC Managed Care – PPO | Admitting: Surgery

## 2019-03-23 ENCOUNTER — Encounter (HOSPITAL_COMMUNITY): Payer: Self-pay | Admitting: Surgery

## 2019-03-23 ENCOUNTER — Ambulatory Visit (HOSPITAL_COMMUNITY): Payer: BC Managed Care – PPO | Admitting: Physician Assistant

## 2019-03-23 ENCOUNTER — Encounter (HOSPITAL_COMMUNITY)
Admission: RE | Disposition: A | Discharge status: Home / Self Care | Payer: Self-pay | Source: Home / Self Care | Attending: Surgery

## 2019-03-23 ENCOUNTER — Ambulatory Visit (HOSPITAL_COMMUNITY): Payer: BC Managed Care – PPO | Admitting: Anesthesiology

## 2019-03-23 DIAGNOSIS — Z6841 Body Mass Index (BMI) 40.0 and over, adult: Secondary | ICD-10-CM | POA: Diagnosis not present

## 2019-03-23 DIAGNOSIS — K219 Gastro-esophageal reflux disease without esophagitis: Secondary | ICD-10-CM | POA: Diagnosis present

## 2019-03-23 DIAGNOSIS — Z9049 Acquired absence of other specified parts of digestive tract: Secondary | ICD-10-CM

## 2019-03-23 DIAGNOSIS — K5900 Constipation, unspecified: Secondary | ICD-10-CM | POA: Diagnosis present

## 2019-03-23 DIAGNOSIS — I10 Essential (primary) hypertension: Secondary | ICD-10-CM | POA: Diagnosis present

## 2019-03-23 DIAGNOSIS — Z5331 Laparoscopic surgical procedure converted to open procedure: Secondary | ICD-10-CM

## 2019-03-23 DIAGNOSIS — K660 Peritoneal adhesions (postprocedural) (postinfection): Secondary | ICD-10-CM | POA: Diagnosis present

## 2019-03-23 DIAGNOSIS — K432 Incisional hernia without obstruction or gangrene: Secondary | ICD-10-CM | POA: Diagnosis present

## 2019-03-23 DIAGNOSIS — Z888 Allergy status to other drugs, medicaments and biological substances status: Secondary | ICD-10-CM | POA: Diagnosis not present

## 2019-03-23 DIAGNOSIS — Z885 Allergy status to narcotic agent status: Secondary | ICD-10-CM | POA: Diagnosis not present

## 2019-03-23 HISTORY — PX: INCISIONAL HERNIA REPAIR: SHX193

## 2019-03-23 SURGERY — REPAIR, HERNIA, INCISIONAL, LAPAROSCOPIC
Anesthesia: General

## 2019-03-23 SURGERY — REPAIR, HERNIA, INCISIONAL, LAPAROSCOPIC
Anesthesia: General | Site: Abdomen

## 2019-03-23 MED ORDER — KETAMINE HCL 10 MG/ML IJ SOLN
INTRAMUSCULAR | Status: AC
  Filled 2019-03-23: qty 1

## 2019-03-23 MED ORDER — PROMETHAZINE HCL 25 MG/ML IJ SOLN
INTRAMUSCULAR | Status: AC
  Filled 2019-03-23: qty 1

## 2019-03-23 MED ORDER — MORPHINE SULFATE (PF) 2 MG/ML IV SOLN: 1.0000 mg | INTRAVENOUS | Status: DC | PRN

## 2019-03-23 MED ORDER — LIDOCAINE 2% (20 MG/ML) 5 ML SYRINGE
INTRAMUSCULAR | Status: DC | PRN
  Administered 2019-03-23: 100 mg via INTRAVENOUS

## 2019-03-23 MED ORDER — CHLORHEXIDINE GLUCONATE CLOTH 2 % EX PADS: 6.0000 | MEDICATED_PAD | Freq: Once | CUTANEOUS | Status: DC

## 2019-03-23 MED ORDER — PANTOPRAZOLE SODIUM 40 MG PO TBEC
40.0000 mg | DELAYED_RELEASE_TABLET | Freq: Every day | ORAL | Status: DC
  Administered 2019-03-24 – 2019-03-25 (×2): 40 mg via ORAL
  Filled 2019-03-23 (×2): qty 1

## 2019-03-23 MED ORDER — FENTANYL CITRATE (PF) 250 MCG/5ML IJ SOLN
INTRAMUSCULAR | Status: AC
  Filled 2019-03-23: qty 5

## 2019-03-23 MED ORDER — FENTANYL CITRATE (PF) 100 MCG/2ML IJ SOLN
INTRAMUSCULAR | Status: AC
  Filled 2019-03-23: qty 2

## 2019-03-23 MED ORDER — SERTRALINE HCL 50 MG PO TABS
100.0000 mg | ORAL_TABLET | Freq: Every day | ORAL | Status: DC
  Administered 2019-03-24 – 2019-03-25 (×2): 100 mg via ORAL
  Filled 2019-03-23 (×2): qty 2

## 2019-03-23 MED ORDER — GABAPENTIN 300 MG PO CAPS
300.0000 mg | ORAL_CAPSULE | Freq: Two times a day (BID) | ORAL | Status: DC
  Administered 2019-03-23 – 2019-03-25 (×4): 300 mg via ORAL
  Filled 2019-03-23 (×4): qty 1

## 2019-03-23 MED ORDER — ROCURONIUM BROMIDE 10 MG/ML (PF) SYRINGE
PREFILLED_SYRINGE | INTRAVENOUS | Status: DC | PRN
  Administered 2019-03-23: 100 mg via INTRAVENOUS

## 2019-03-23 MED ORDER — ROPINIROLE HCL 0.5 MG PO TABS
0.2500 mg | ORAL_TABLET | Freq: Every day | ORAL | Status: DC
  Administered 2019-03-23 – 2019-03-24 (×2): 0.25 mg via ORAL
  Filled 2019-03-23 (×2): qty 1

## 2019-03-23 MED ORDER — FENTANYL CITRATE (PF) 250 MCG/5ML IJ SOLN
INTRAMUSCULAR | Status: DC | PRN
  Administered 2019-03-23: 50 ug via INTRAVENOUS
  Administered 2019-03-23: 100 ug via INTRAVENOUS
  Administered 2019-03-23: 50 ug via INTRAVENOUS

## 2019-03-23 MED ORDER — CELECOXIB 200 MG PO CAPS
200.0000 mg | ORAL_CAPSULE | ORAL | Status: AC
  Administered 2019-03-23: 200 mg via ORAL
  Filled 2019-03-23: qty 1

## 2019-03-23 MED ORDER — DEXAMETHASONE SODIUM PHOSPHATE 10 MG/ML IJ SOLN
INTRAMUSCULAR | Status: DC | PRN
  Administered 2019-03-23: 10 mg via INTRAVENOUS

## 2019-03-23 MED ORDER — MEPERIDINE HCL 50 MG/ML IJ SOLN: 6.2500 mg | INTRAMUSCULAR | Status: DC | PRN

## 2019-03-23 MED ORDER — CELECOXIB 200 MG PO CAPS
200.0000 mg | ORAL_CAPSULE | Freq: Two times a day (BID) | ORAL | Status: DC
  Administered 2019-03-23 – 2019-03-25 (×4): 200 mg via ORAL
  Filled 2019-03-23 (×4): qty 1

## 2019-03-23 MED ORDER — ONDANSETRON HCL 4 MG/2ML IJ SOLN
4.0000 mg | Freq: Once | INTRAMUSCULAR | Status: AC
  Administered 2019-03-23: 4 mg via INTRAVENOUS

## 2019-03-23 MED ORDER — MIDAZOLAM HCL 2 MG/2ML IJ SOLN
INTRAMUSCULAR | Status: AC
  Filled 2019-03-23: qty 2

## 2019-03-23 MED ORDER — MIDAZOLAM HCL 2 MG/2ML IJ SOLN
INTRAMUSCULAR | Status: DC | PRN
  Administered 2019-03-23: 2 mg via INTRAVENOUS

## 2019-03-23 MED ORDER — PROPOFOL 10 MG/ML IV BOLUS
INTRAVENOUS | Status: DC | PRN
  Administered 2019-03-23: 200 mg via INTRAVENOUS

## 2019-03-23 MED ORDER — ONDANSETRON 4 MG PO TBDP
4.0000 mg | ORAL_TABLET | Freq: Four times a day (QID) | ORAL | Status: DC | PRN
  Administered 2019-03-25: 4 mg via ORAL
  Filled 2019-03-23: qty 1

## 2019-03-23 MED ORDER — ONDANSETRON HCL 4 MG/2ML IJ SOLN: 4.0000 mg | Freq: Four times a day (QID) | INTRAMUSCULAR | Status: DC | PRN

## 2019-03-23 MED ORDER — PROMETHAZINE HCL 25 MG/ML IJ SOLN
6.2500 mg | INTRAMUSCULAR | Status: DC | PRN
  Administered 2019-03-23: 12.5 mg via INTRAVENOUS

## 2019-03-23 MED ORDER — ACETAMINOPHEN 500 MG PO TABS
1000.0000 mg | ORAL_TABLET | ORAL | Status: AC
  Administered 2019-03-23: 1000 mg via ORAL
  Filled 2019-03-23: qty 2

## 2019-03-23 MED ORDER — METHOCARBAMOL 500 MG PO TABS
500.0000 mg | ORAL_TABLET | Freq: Four times a day (QID) | ORAL | Status: DC | PRN
  Administered 2019-03-24 – 2019-03-25 (×3): 500 mg via ORAL
  Filled 2019-03-23 (×3): qty 1

## 2019-03-23 MED ORDER — POTASSIUM CHLORIDE IN NACL 20-0.9 MEQ/L-% IV SOLN
INTRAVENOUS | Status: DC
  Filled 2019-03-23 (×5): qty 1000

## 2019-03-23 MED ORDER — SUGAMMADEX SODIUM 500 MG/5ML IV SOLN
INTRAVENOUS | Status: AC
  Filled 2019-03-23: qty 5

## 2019-03-23 MED ORDER — ONDANSETRON HCL 4 MG/2ML IJ SOLN
INTRAMUSCULAR | Status: DC | PRN
  Administered 2019-03-23: 4 mg via INTRAVENOUS

## 2019-03-23 MED ORDER — ACETAMINOPHEN 500 MG PO TABS
1000.0000 mg | ORAL_TABLET | Freq: Four times a day (QID) | ORAL | Status: DC
  Administered 2019-03-23 – 2019-03-25 (×6): 1000 mg via ORAL
  Filled 2019-03-23 (×7): qty 2

## 2019-03-23 MED ORDER — FENTANYL CITRATE (PF) 100 MCG/2ML IJ SOLN
25.0000 ug | INTRAMUSCULAR | Status: DC | PRN
  Administered 2019-03-23: 50 ug via INTRAVENOUS

## 2019-03-23 MED ORDER — SUGAMMADEX SODIUM 200 MG/2ML IV SOLN
INTRAVENOUS | Status: DC | PRN
  Administered 2019-03-23: 500 mg via INTRAVENOUS

## 2019-03-23 MED ORDER — LIDOCAINE 2% (20 MG/ML) 5 ML SYRINGE
INTRAMUSCULAR | Status: AC
  Filled 2019-03-23: qty 5

## 2019-03-23 MED ORDER — ONDANSETRON HCL 4 MG/2ML IJ SOLN
INTRAMUSCULAR | Status: AC
  Filled 2019-03-23: qty 2

## 2019-03-23 MED ORDER — TRIAMTERENE-HCTZ 37.5-25 MG PO TABS
1.0000 | ORAL_TABLET | Freq: Every day | ORAL | Status: DC
  Administered 2019-03-24 – 2019-03-25 (×2): 1 via ORAL
  Filled 2019-03-23 (×2): qty 1

## 2019-03-23 MED ORDER — OXYCODONE HCL 5 MG PO TABS
5.0000 mg | ORAL_TABLET | ORAL | Status: DC | PRN
  Administered 2019-03-23 – 2019-03-24 (×2): 5 mg via ORAL
  Filled 2019-03-23 (×2): qty 1

## 2019-03-23 MED ORDER — 0.9 % SODIUM CHLORIDE (POUR BTL) OPTIME
TOPICAL | Status: DC | PRN
  Administered 2019-03-23: 10:00:00 1000 mL

## 2019-03-23 MED ORDER — DOCUSATE SODIUM 100 MG PO CAPS
100.0000 mg | ORAL_CAPSULE | Freq: Two times a day (BID) | ORAL | Status: DC
  Administered 2019-03-23 – 2019-03-25 (×4): 100 mg via ORAL
  Filled 2019-03-23 (×4): qty 1

## 2019-03-23 MED ORDER — LIDOCAINE 2% (20 MG/ML) 5 ML SYRINGE
INTRAMUSCULAR | Status: DC | PRN
  Administered 2019-03-23: 1.5 mg/kg/h via INTRAVENOUS

## 2019-03-23 MED ORDER — BUPIVACAINE HCL (PF) 0.25 % IJ SOLN
INTRAMUSCULAR | Status: AC
  Filled 2019-03-23: qty 30

## 2019-03-23 MED ORDER — PROPOFOL 10 MG/ML IV BOLUS
INTRAVENOUS | Status: AC
  Filled 2019-03-23: qty 20

## 2019-03-23 MED ORDER — KETAMINE HCL 10 MG/ML IJ SOLN
INTRAMUSCULAR | Status: DC | PRN
  Administered 2019-03-23: 10 mg via INTRAVENOUS
  Administered 2019-03-23: 50 mg via INTRAVENOUS
  Administered 2019-03-23: 10 mg via INTRAVENOUS

## 2019-03-23 MED ORDER — DIPHENHYDRAMINE HCL 25 MG PO CAPS: 25.0000 mg | ORAL_CAPSULE | Freq: Four times a day (QID) | ORAL | Status: DC | PRN

## 2019-03-23 MED ORDER — DIPHENHYDRAMINE HCL 50 MG/ML IJ SOLN: 25.0000 mg | Freq: Four times a day (QID) | INTRAMUSCULAR | Status: DC | PRN

## 2019-03-23 MED ORDER — LACTATED RINGERS IV SOLN: INTRAVENOUS | Status: DC

## 2019-03-23 MED ORDER — ROCURONIUM BROMIDE 10 MG/ML (PF) SYRINGE
PREFILLED_SYRINGE | INTRAVENOUS | Status: AC
  Filled 2019-03-23: qty 10

## 2019-03-23 MED ORDER — LIDOCAINE HCL 2 % IJ SOLN
INTRAMUSCULAR | Status: AC
  Filled 2019-03-23: qty 20

## 2019-03-23 MED ORDER — ENOXAPARIN SODIUM 40 MG/0.4ML ~~LOC~~ SOLN
40.0000 mg | SUBCUTANEOUS | Status: DC
  Administered 2019-03-24 – 2019-03-25 (×2): 40 mg via SUBCUTANEOUS
  Filled 2019-03-23 (×3): qty 0.4

## 2019-03-23 MED ORDER — OXYCODONE HCL 5 MG/5ML PO SOLN: 5.0000 mg | Freq: Once | ORAL | Status: DC | PRN

## 2019-03-23 MED ORDER — DEXAMETHASONE SODIUM PHOSPHATE 10 MG/ML IJ SOLN
INTRAMUSCULAR | Status: AC
  Filled 2019-03-23: qty 1

## 2019-03-23 MED ORDER — OXYCODONE HCL 5 MG PO TABS: 5.0000 mg | ORAL_TABLET | Freq: Once | ORAL | Status: DC | PRN

## 2019-03-23 MED ORDER — GABAPENTIN 300 MG PO CAPS
300.0000 mg | ORAL_CAPSULE | ORAL | Status: AC
  Administered 2019-03-23: 300 mg via ORAL
  Filled 2019-03-23: qty 1

## 2019-03-23 MED ORDER — TRAMADOL HCL 50 MG PO TABS: 50.0000 mg | ORAL_TABLET | Freq: Four times a day (QID) | ORAL | Status: DC | PRN

## 2019-03-23 SURGICAL SUPPLY — 53 items
APL PRP STRL LF DISP 70% ISPRP (MISCELLANEOUS) ×1
APPLIER CLIP 5 13 M/L LIGAMAX5 (MISCELLANEOUS)
APR CLP MED LRG 5 ANG JAW (MISCELLANEOUS)
BINDER ABDOMINAL 12 ML 46-62 (SOFTGOODS) ×2 IMPLANT
CABLE HIGH FREQUENCY MONO STRZ (ELECTRODE) ×3 IMPLANT
CHLORAPREP W/TINT 26 (MISCELLANEOUS) ×3 IMPLANT
CLIP APPLIE 5 13 M/L LIGAMAX5 (MISCELLANEOUS) IMPLANT
CLOSURE WOUND 1/2 X4 (GAUZE/BANDAGES/DRESSINGS)
COVER WAND RF STERILE (DRAPES) ×2 IMPLANT
DEVICE SECURE STRAP 25 ABSORB (INSTRUMENTS) IMPLANT
DEVICE TROCAR PUNCTURE CLOSURE (ENDOMECHANICALS) ×3 IMPLANT
DRAIN CHANNEL 19F RND (DRAIN) ×2 IMPLANT
DRSG OPSITE POSTOP 4X10 (GAUZE/BANDAGES/DRESSINGS) IMPLANT
DRSG OPSITE POSTOP 4X12 (GAUZE/BANDAGES/DRESSINGS) ×2 IMPLANT
DRSG TEGADERM 2-3/8X2-3/4 SM (GAUZE/BANDAGES/DRESSINGS) ×2 IMPLANT
DRSG TEGADERM 4X4.75 (GAUZE/BANDAGES/DRESSINGS) ×2 IMPLANT
ELECT PENCIL ROCKER SW 15FT (MISCELLANEOUS) ×2 IMPLANT
ELECT REM PT RETURN 15FT ADLT (MISCELLANEOUS) ×3 IMPLANT
EVACUATOR SILICONE 100CC (DRAIN) ×2 IMPLANT
GAUZE SPONGE 2X2 8PLY STRL LF (GAUZE/BANDAGES/DRESSINGS) IMPLANT
GLOVE SURG SIGNA 7.5 PF LTX (GLOVE) ×3 IMPLANT
GOWN STRL REUS W/TWL XL LVL3 (GOWN DISPOSABLE) ×9 IMPLANT
KIT BASIN OR (CUSTOM PROCEDURE TRAY) ×3 IMPLANT
KIT TURNOVER KIT A (KITS) IMPLANT
MARKER SKIN DUAL TIP RULER LAB (MISCELLANEOUS) ×3 IMPLANT
MESH VENTRALIGHT ST 8X10 (Mesh General) ×2 IMPLANT
NDL SPNL 22GX3.5 QUINCKE BK (NEEDLE) ×1 IMPLANT
NEEDLE SPNL 22GX3.5 QUINCKE BK (NEEDLE) ×3 IMPLANT
PENCIL SMOKE EVACUATOR (MISCELLANEOUS) IMPLANT
PROTECTOR NERVE ULNAR (MISCELLANEOUS) ×2 IMPLANT
SCISSORS LAP 5X35 DISP (ENDOMECHANICALS) ×2 IMPLANT
SET IRRIG TUBING LAPAROSCOPIC (IRRIGATION / IRRIGATOR) IMPLANT
SET TUBE SMOKE EVAC HIGH FLOW (TUBING) ×3 IMPLANT
SHEARS HARMONIC ACE PLUS 36CM (ENDOMECHANICALS) IMPLANT
SLEEVE XCEL OPT CAN 5 100 (ENDOMECHANICALS) ×3 IMPLANT
SOL ANTI FOG 6CC (MISCELLANEOUS) ×1 IMPLANT
SOLUTION ANTI FOG 6CC (MISCELLANEOUS) ×2
SPONGE GAUZE 2X2 STER 10/PKG (GAUZE/BANDAGES/DRESSINGS) ×2
SPONGE LAP 18X18 RF (DISPOSABLE) ×2 IMPLANT
STAPLER VISISTAT 35W (STAPLE) ×2 IMPLANT
STRIP CLOSURE SKIN 1/2X4 (GAUZE/BANDAGES/DRESSINGS) IMPLANT
SUT ETHILON 2 0 PS N (SUTURE) ×4 IMPLANT
SUT MNCRL AB 4-0 PS2 18 (SUTURE) ×2 IMPLANT
SUT NOVA NAB DX-16 0-1 5-0 T12 (SUTURE) ×7 IMPLANT
SUT PDS AB 1 TP1 96 (SUTURE) ×4 IMPLANT
TACKER 5MM HERNIA 3.5CML NAB (ENDOMECHANICALS) IMPLANT
TAPE CLOTH 4X10 WHT NS (GAUZE/BANDAGES/DRESSINGS) IMPLANT
TOWEL OR 17X26 10 PK STRL BLUE (TOWEL DISPOSABLE) ×3 IMPLANT
TOWEL OR NON WOVEN STRL DISP B (DISPOSABLE) ×3 IMPLANT
TRAY LAPAROSCOPIC (CUSTOM PROCEDURE TRAY) ×3 IMPLANT
TROCAR BLADELESS OPT 5 100 (ENDOMECHANICALS) ×3 IMPLANT
TROCAR XCEL NON-BLD 11X100MML (ENDOMECHANICALS) ×3 IMPLANT
YANKAUER SUCT BULB TIP 10FT TU (MISCELLANEOUS) ×2 IMPLANT

## 2019-03-23 NOTE — Anesthesia Preprocedure Evaluation (Signed)
Anesthesia Evaluation  Patient identified by MRN, date of birth, ID band Patient awake    Reviewed: Allergy & Precautions, H&P , NPO status , Patient's Chart, lab work & pertinent test results  Airway Mallampati: III  TM Distance: >3 FB Neck ROM: Full    Dental no notable dental hx. (+) Teeth Intact, Dental Advisory Given   Pulmonary neg pulmonary ROS,    Pulmonary exam normal breath sounds clear to auscultation       Cardiovascular hypertension, Pt. on medications  Rhythm:Regular Rate:Normal     Neuro/Psych Anxiety Depression negative neurological ROS     GI/Hepatic Neg liver ROS, GERD  Medicated and Controlled,  Endo/Other  Morbid obesity  Renal/GU negative Renal ROS  negative genitourinary   Musculoskeletal   Abdominal   Peds  Hematology negative hematology ROS (+)   Anesthesia Other Findings   Reproductive/Obstetrics negative OB ROS                             Anesthesia Physical  Anesthesia Plan  ASA: III  Anesthesia Plan: General   Post-op Pain Management:    Induction: Intravenous  PONV Risk Score and Plan: 3 and Ondansetron, Dexamethasone, Midazolam and Treatment may vary due to age or medical condition  Airway Management Planned: Oral ETT  Additional Equipment:   Intra-op Plan:   Post-operative Plan: Extubation in OR  Informed Consent: I have reviewed the patients History and Physical, chart, labs and discussed the procedure including the risks, benefits and alternatives for the proposed anesthesia with the patient or authorized representative who has indicated his/her understanding and acceptance.     Dental advisory given  Plan Discussed with: CRNA  Anesthesia Plan Comments:         Anesthesia Quick Evaluation

## 2019-03-23 NOTE — Anesthesia Postprocedure Evaluation (Signed)
Anesthesia Post Note  Patient: JENAIYA LIPS  Procedure(s) Performed: LAPAROSCOPIC CONVERTED TO OPEN INCISIONAL HERNIA REPAIR WITH MESH (N/A Abdomen)     Patient location during evaluation: PACU Anesthesia Type: General Level of consciousness: awake and alert Pain management: pain level controlled Vital Signs Assessment: post-procedure vital signs reviewed and stable Respiratory status: spontaneous breathing, nonlabored ventilation and respiratory function stable Cardiovascular status: blood pressure returned to baseline and stable Postop Assessment: no apparent nausea or vomiting Anesthetic complications: no    Last Vitals:  Vitals:   03/23/19 1100 03/23/19 1115  BP: (!) 142/90 (!) 147/89  Pulse: 63 64  Resp: 13 17  Temp:    SpO2: 99% 96%    Last Pain:  Vitals:   03/23/19 1125  PainSc: Asleep                 Lynda Rainwater

## 2019-03-23 NOTE — Transfer of Care (Signed)
Immediate Anesthesia Transfer of Care Note  Patient: [name] [name]  Procedure(s) Performed: LAPAROSCOPIC CONVERTED TO OPEN INCISIONAL HERNIA REPAIR WITH MESH (N/A Abdomen)  Patient Location: PACU  Anesthesia Type:General  Level of Consciousness: drowsy  Airway & Oxygen Therapy: Patient Spontanous Breathing and Patient connected to face mask oxygen  Post-op Assessment: Report given to RN and Post -op Vital signs reviewed and stable  Post vital signs: Reviewed and stable  Last Vitals:  Vitals Value Taken Time  BP 160/86 03/23/19 1031  Temp    Pulse 61 03/23/19 1032  Resp 19 03/23/19 1032  SpO2 98 % 03/23/19 1032  Vitals shown include unvalidated device data.  Last Pain:  Vitals:   03/23/19 0711  PainSc: 0-No pain         Complications: No apparent anesthesia complications

## 2019-03-23 NOTE — Interval H&P Note (Signed)
History and Physical Interval Note:no change in H and P  03/23/2019 8:27 AM  [name] [name]  has presented today for surgery, with the diagnosis of INCISIONAL HERNIA.  The various methods of treatment have been discussed with the patient and family. After consideration of risks, benefits and other options for treatment, the patient has consented to  Procedure(s): [place] (N/A) as a surgical intervention.  The patient's history has been reviewed, patient examined, no change in status, stable for surgery.  I have reviewed the patient's chart and labs.  Questions were answered to the patient's satisfaction.     [name]

## 2019-03-23 NOTE — Anesthesia Procedure Notes (Signed)
Procedure Name: Intubation Date/Time: 03/23/2019 8:38 AM Performed by: Sharlette Dense, CRNA Patient Re-evaluated:Patient Re-evaluated prior to induction Oxygen Delivery Method: Circle system utilized Preoxygenation: Pre-oxygenation with 100% oxygen Induction Type: IV induction Ventilation: Mask ventilation without difficulty and Oral airway inserted - appropriate to patient size Laryngoscope Size: Sabra Heck and 2 Grade View: Grade I Tube type: Oral Tube size: 7.5 mm Number of attempts: 1 Airway Equipment and Method: Stylet Placement Confirmation: ETT inserted through vocal cords under direct vision,  positive ETCO2 and breath sounds checked- equal and bilateral Secured at: 21 cm Tube secured with: Tape Dental Injury: Teeth and Oropharynx as per pre-operative assessment

## 2019-03-23 NOTE — Op Note (Signed)
LAPAROSCOPIC CONVERTED TO OPEN INCISIONAL HERNIA REPAIR WITH MESH  Procedure Note  [name] [name] 03/23/2019   Pre-op Diagnosis: INCISIONAL HERNIA     Post-op Diagnosis: same  Procedure(s): LAPAROSCOPIC CONVERTED TO OPEN INCISIONAL HERNIA REPAIR WITH MESH ( 20 cm x 25 cm ventralite ST mesh from [employer])  Surgeon(s): Greer Pickerel, MD [name], MD  Anesthesia: General  Staff:  Circulator: Anderson Malta, RN Scrub Person: Joellen Jersey, CST; Charlann Noss  Estimated Blood Loss: Minimal               Indications: This is a 59 year old female who is status post a Hartman's procedure in 2018 followed by a laparoscopic colostomy takedown the same year.  She now presents with an incisional hernia.  She underwent a CT scan last year showing her to have a fascial defect containing transverse colon at the upper midline incision near the umbilicus as well as another fascial defect containing small bowel at the lower aspect of her incision.  The decision was made to proceed with hernia repair with mesh.  This would be attempted laparoscopic.  Findings: The patient had extensive adhesions.  I have placed an Optiview 5 mm trocar in the left upper quadrant and found the omentum and stomach fixed to the abdominal wall.  There was no window to place any other trochars so the procedure was converted to an open procedure.  Procedure: The patient was brought to the operating room and identifies the correct patient.  She is placed upon the operating table and general anesthesia was induced.  Her abdomen was then prepped and draped in usual sterile fashion.  I created a small incision in the left upper quadrant with a scalpel.  I then used the 5 mm trocar and Optiview camera to slowly traversed all layers of the abdominal wall and gain entrance into the abdominal cavity.  Insufflation was begun.  The patient was found to have a significant amount of adhesions.  The stomach and omentum were  completely fixed to all quadrants of the abdominal wall.  I could not see any window visible to safely place any other trochars.  At this point, the decision was made to convert to an open procedure.  The 5 mm trocar was removed and the abdomen was deflated. I then created a midline incision from above the umbilicus to the pubis through her previous scar with a scalpel.  I take this down through the subcutaneous tissue and identified the 2 large hernia sacs.  I opened both sacs and found 2 separate hernias.  The upper hernia contained: In the lower 1 contain small bowel.  There were no adhesions of the small bowel in the lower hernia defect.  I had to lyse the adhesions to free up the colon from the fascia and reduced it back to the abdominal wall.  I to free up omentum circumferentially around the defect as well.  I then opened up the fascia between the 2 hernia defects creating 1 large opening.  I did lyse further adhesions in order to clear the fascia circumferentially.  I could then palpate that she felt weak at her old ostomy site as well.  I undermined the fascia and subcutaneous tissue circumferentially around the fascia.  At this point, I did not think I can create a plane between the peritoneum and underlying muscle for an underlay so I elected to place the mesh as an overlay.  I closed the patient's midline fascia with a  running #1 looped PDS suture.  A 20 cm x 25 cm large piece of dual sided ventral light ST mesh from [employer] was brought to the field.  I placed it as an onlay over the fascial closure including the ostomy site.  I then secured the mesh circumferentially to the underlying fascia with interrupted #1 Novafil sutures.  Wide coverage appeared to be achieved.  I irrigated the wound with saline.  I made 2 separate skin incision and placed [place] drains into the wound.  These were secured in place with nylon sutures.  Again, hemostasis appeared to be achieved.  We then closed the midline  incision with staples.  We closed the small trocar site in the left upper quadrant with staples well.  The drains were then placed to bulb suction.  Gauze and tape were then applied.  The patient tolerated the procedure well.  All the counts were correct at the end of the procedure.  The patient was then extubated in the operating room and taken in stable addition to the recovery room.          [name]   Date: 03/23/2019  Time: 10:18 AM

## 2019-03-24 ENCOUNTER — Encounter: Payer: Self-pay | Admitting: *Deleted

## 2019-03-24 LAB — CBC
HCT: 35.4 % — ABNORMAL LOW (ref 36.0–46.0)
Hemoglobin: 11.4 g/dL — ABNORMAL LOW (ref 12.0–15.0)
MCH: 29.5 pg (ref 26.0–34.0)
MCHC: 32.2 g/dL (ref 30.0–36.0)
MCV: 91.5 fL (ref 80.0–100.0)
Platelets: 219 10*3/uL (ref 150–400)
RBC: 3.87 MIL/uL (ref 3.87–5.11)
RDW: 14.5 % (ref 11.5–15.5)
WBC: 10.8 10*3/uL — ABNORMAL HIGH (ref 4.0–10.5)
nRBC: 0 % (ref 0.0–0.2)

## 2019-03-24 LAB — BASIC METABOLIC PANEL
Anion gap: 7 (ref 5–15)
BUN: 20 mg/dL (ref 6–20)
CO2: 28 mmol/L (ref 22–32)
Calcium: 8.6 mg/dL — ABNORMAL LOW (ref 8.9–10.3)
Chloride: 103 mmol/L (ref 98–111)
Creatinine, Ser: 1.09 mg/dL — ABNORMAL HIGH (ref 0.44–1.00)
GFR calc Af Amer: >60 mL/min (ref >60)
GFR calc non Af Amer: 56 mL/min — ABNORMAL LOW (ref >60)
Glucose, Bld: 119 mg/dL — ABNORMAL HIGH (ref 70–99)
Potassium: 3.5 mmol/L (ref 3.5–5.1)
Sodium: 138 mmol/L (ref 135–145)

## 2019-03-24 MED ORDER — OXYCODONE HCL 5 MG PO TABS: 5.0000 mg | ORAL_TABLET | Freq: Four times a day (QID) | ORAL | 0 refills | Status: AC | PRN

## 2019-03-24 NOTE — Progress Notes (Signed)
1 Day Post-Op   Subjective/Chief Complaint: Comfortable this morning Pain controlled   Objective: Vital signs in last 24 hours: Temp:  [97.5 F (36.4 C)-98.1 F (36.7 C)] 97.8 F (36.6 C) (01/29 0448) Pulse Rate:  [59-75] 59 (01/29 0448) Resp:  [13-20] 16 (01/29 0448) BP: (111-160)/(80-97) 123/80 (01/29 0448) SpO2:  [92 %-100 %] 97 % (01/29 0448) Last BM Date: 03/22/19  Intake/Output from previous day: 01/28 0701 - 01/29 0700 In: 3122.6 [P.O.:480; I.V.:2592.6; IV Piggyback:50] Out: 1800 [Urine:1475; Drains:250; Blood:75] Intake/Output this shift: No intake/output data recorded.  Exam: Awake and alert Looks comfortable Abdomen soft, dressing dry, drains serosang  Lab Results:  Recent Labs    03/24/19 0407  WBC 10.8*  HGB 11.4*  HCT 35.4*  PLT 219   BMET Recent Labs    03/24/19 0407  NA 138  K 3.5  CL 103  CO2 28  GLUCOSE 119*  BUN 20  CREATININE 1.09*  CALCIUM 8.6*   PT/INR No results for input(s): LABPROT, INR in the last 72 hours. ABG No results for input(s): PHART, HCO3 in the last 72 hours.  Invalid input(s): PCO2, PO2  Studies/Results: No results found.  Anti-infectives: Anti-infectives (From admission, onward)   Start     Dose/Rate Route Frequency Ordered Stop   03/23/19 0600  ceFAZolin (ANCEF) 3 g in dextrose 5 % 50 mL IVPB     3 g 100 mL/hr over 30 Minutes Intravenous On call to O.R. 03/22/19 0754 03/23/19 0853      Assessment/Plan: s/p Procedure(s): LAPAROSCOPIC CONVERTED TO OPEN INCISIONAL HERNIA REPAIR WITH MESH (N/A)  D/C foley Ambulate Advance po Decrease IVF Hopefully home in next 24 to 48 hours  LOS: 1 day    [name] 03/24/2019

## 2019-03-24 NOTE — Discharge Instructions (Signed)
[facility], [place] [phone]  OPEN ABDOMINAL SURGERY: POST OP INSTRUCTIONS  Always review your discharge instruction sheet given to you by the facility where your surgery was performed.  IF YOU HAVE DISABILITY OR FAMILY LEAVE FORMS, YOU MUST BRING THEM TO THE OFFICE FOR PROCESSING.  PLEASE DO NOT GIVE THEM TO YOUR DOCTOR.  1. A prescription for pain medication may be given to you upon discharge.  Take your pain medication as prescribed, if needed.  If narcotic pain medicine is not needed, then you may take acetaminophen (Tylenol) or ibuprofen (Advil) as needed. 2. Take your usually prescribed medications unless otherwise directed. 3. If you need a refill on your pain medication, please contact your pharmacy. They will contact our office to request authorization.  Prescriptions will not be filled after 5pm or on week-ends. 4. You should follow a light diet the first few days after arrival home, such as soup and crackers, pudding, etc.unless your doctor has advised otherwise. A high-fiber, low fat diet can be resumed as tolerated.   Be sure to include lots of fluids daily. Most patients will experience some swelling and bruising on the chest and neck area.  Ice packs will help.  Swelling and bruising can take several days to resolve 5. Most patients will experience some swelling and bruising in the area of the incision. Ice pack will help. Swelling and bruising can take several days to resolve..  6. It is common to experience some constipation if taking pain medication after surgery.  Increasing fluid intake and taking a stool softener will usually help or prevent this problem from occurring.  A mild laxative (Milk of Magnesia or Miralax) should be taken according to package directions if there are no bowel movements after 48 hours. 7.  You may have steri-strips (small skin tapes) in place directly over the incision.  These strips should be left on the skin for 7-10 days.  If your  surgeon used skin glue on the incision, you may shower in 24 hours.  The glue will flake off over the next 2-3 weeks.  Any sutures or staples will be removed at the office during your follow-up visit. You may find that a light gauze bandage over your incision may keep your staples from being rubbed or pulled. You may shower and replace the bandage daily. 8. ACTIVITIES:  You may resume regular (light) daily activities beginning the next day--such as daily self-care, walking, climbing stairs--gradually increasing activities as tolerated.  You may have sexual intercourse when it is comfortable.  Refrain from any heavy lifting or straining until approved by your doctor. a. You may drive when you no longer are taking prescription pain medication, you can comfortably wear a seatbelt, and you can safely maneuver your car and apply brakes b. Return to Work: ___________________________________ 19. You should see your doctor in the office for a follow-up appointment approximately two weeks after your surgery.  Make sure that you call for this appointment within a day or two after you arrive home to insure a convenient appointment time. OTHER INSTRUCTIONS: NO LIFTING MORE THAN 15 POUNDS FOR 6 WEEKS RECORD DRAIN OUTPUT AND BRING TO THE OFFICE _____________________________________________________________ _____________________________________________________________  WHEN TO CALL YOUR DOCTOR: 1. Fever over 101.0 2. Inability to urinate 3. Nausea and/or vomiting 4. Extreme swelling or bruising 5. Continued bleeding from incision. 6. Increased pain, redness, or drainage from the incision. 7. Difficulty swallowing or breathing 8. Muscle cramping or spasms. 9. Numbness  or tingling in hands or feet or around lips.  The clinic staff is available to answer your questions during regular business hours.  Please don't hesitate to call and ask to speak to one of the nurses if you have concerns.  For further questions,  please visit [email or web address]

## 2019-03-25 MED ORDER — METHOCARBAMOL 500 MG PO TABS: 500.0000 mg | ORAL_TABLET | Freq: Four times a day (QID) | ORAL | 0 refills | Status: AC | PRN

## 2019-03-25 NOTE — Discharge Summary (Signed)
Physician Discharge Summary  [name] [name] [id number] DOB: Feb 14, 1961 DOA: 03/23/2019  PCP: Shon Baton, MD  Admit date: 03/23/2019 Discharge date: 03/25/2019  Recommendations for Outpatient Follow-up:  1. none (include homehealth, outpatient follow-up instructions, specific recommendations for PCP to follow-up on, etc.)  Follow-up Information    [name], MD. Call on 04/07/2019.   Specialty: General Surgery Why: for staple and drain removal Contact information: [place] [address] [phone]          Discharge Diagnoses:  Active Problems:   Incisional hernia   Surgical Procedure: open incisional hernia repair with mesh  Discharge Condition: Good Disposition: Home  Diet recommendation: regular diet   Hospital Course:  59 yo female presented for incisional hernia repair. Due to scar tissue surgery was converted to open procedure. She was admitted to the surgical floor post op. She did well. Diet was advanced. She ambulated well. SHe was discharged home POD 2.  Discharge Instructions  Discharge Instructions    Discharge   Complete by: As directed      Allergies as of 03/25/2019      Reactions   Dilaudid [hydromorphone Hcl] Itching   Quinapril Hcl Hives, Swelling   Yellow Jacket Venom [bee Venom] Swelling   Swelling at site of bite      Medication List    TAKE these medications   fluticasone 50 MCG/ACT nasal spray Commonly known as: FLONASE Place 1 spray into both nostrils daily as needed for allergies.   loratadine 10 MG tablet Commonly known as: CLARITIN Take 10 mg by mouth daily as needed (seasonal allergies).   METAMUCIL PO Take 1 Dose by mouth daily.   methocarbamol 500 MG tablet Commonly known as: ROBAXIN Take 1 tablet (500 mg total) by mouth every 6 (six) hours as needed for muscle spasms.   naproxen sodium 220 MG tablet Commonly known as: ALEVE Take 440 mg by mouth daily as needed (pain).    oxyCODONE 5 MG immediate release tablet Commonly known as: Oxy IR/ROXICODONE Take 1 tablet (5 mg total) by mouth every 6 (six) hours as needed for moderate pain or severe pain.   pantoprazole 40 MG tablet Commonly known as: PROTONIX Take 40 mg by mouth daily.   rOPINIRole 0.25 MG tablet Commonly known as: REQUIP Take 0.25 mg by mouth at bedtime.   sertraline 100 MG tablet Commonly known as: ZOLOFT Take 100 mg by mouth daily.   triamterene-hydrochlorothiazide 37.5-25 MG tablet Commonly known as: MAXZIDE-25 Take 1 tablet by mouth daily.   Vitamin D3 25 MCG (1000 UT) Caps Take 1,000 Units by mouth daily.      Follow-up Information    [name], MD. Call on 04/07/2019.   Specialty: General Surgery Why: for staple and drain removal Contact information: [place] [address] [phone]            The results of significant diagnostics from this hospitalization (including imaging, microbiology, ancillary and laboratory) are listed below for reference.    Significant Diagnostic Studies: No results found.  Labs: Basic Metabolic Panel: Recent Labs  Lab 03/20/19 1330 03/24/19 0407  NA 139 138  K 3.8 3.5  CL 103 103  CO2 26 28  GLUCOSE 96 119*  BUN 25* 20  CREATININE 1.02* 1.09*  CALCIUM 9.8 8.6*   Liver Function Tests: No results for input(s): AST, ALT, ALKPHOS, BILITOT, PROT, ALBUMIN in the last 168 hours.  CBC: Recent Labs  Lab 03/20/19  1330 03/24/19 0407  WBC 6.8 10.8*  HGB 14.5 11.4*  HCT 44.6 35.4*  MCV 89.7 91.5  PLT 247 219    CBG: No results for input(s): GLUCAP in the last 168 hours.  Active Problems:   Incisional hernia   Time coordinating discharge: 15 min

## 2019-03-25 NOTE — Plan of Care (Signed)
resolved 

## 2019-04-12 ENCOUNTER — Other Ambulatory Visit: Payer: Self-pay

## 2019-04-12 ENCOUNTER — Encounter (HOSPITAL_COMMUNITY): Payer: Self-pay

## 2019-04-12 ENCOUNTER — Emergency Department (HOSPITAL_COMMUNITY): Payer: BC Managed Care – PPO

## 2019-04-12 ENCOUNTER — Inpatient Hospital Stay (HOSPITAL_COMMUNITY)
Admission: EM | Admit: 2019-04-12 | Discharge: 2019-04-14 | DRG: 641 | Disposition: A | Discharge status: Home / Self Care | Payer: BC Managed Care – PPO | Attending: Surgery | Admitting: Surgery

## 2019-04-12 DIAGNOSIS — Z8249 Family history of ischemic heart disease and other diseases of the circulatory system: Secondary | ICD-10-CM

## 2019-04-12 DIAGNOSIS — F419 Anxiety disorder, unspecified: Secondary | ICD-10-CM | POA: Diagnosis present

## 2019-04-12 DIAGNOSIS — Z20822 Contact with and (suspected) exposure to covid-19: Secondary | ICD-10-CM | POA: Diagnosis present

## 2019-04-12 DIAGNOSIS — Z79899 Other long term (current) drug therapy: Secondary | ICD-10-CM

## 2019-04-12 DIAGNOSIS — R112 Nausea with vomiting, unspecified: Secondary | ICD-10-CM | POA: Diagnosis not present

## 2019-04-12 DIAGNOSIS — R1110 Vomiting, unspecified: Secondary | ICD-10-CM | POA: Diagnosis present

## 2019-04-12 DIAGNOSIS — Z87442 Personal history of urinary calculi: Secondary | ICD-10-CM

## 2019-04-12 DIAGNOSIS — K219 Gastro-esophageal reflux disease without esophagitis: Secondary | ICD-10-CM | POA: Diagnosis present

## 2019-04-12 DIAGNOSIS — I10 Essential (primary) hypertension: Secondary | ICD-10-CM | POA: Diagnosis present

## 2019-04-12 DIAGNOSIS — E785 Hyperlipidemia, unspecified: Secondary | ICD-10-CM | POA: Diagnosis present

## 2019-04-12 DIAGNOSIS — Z91030 Bee allergy status: Secondary | ICD-10-CM

## 2019-04-12 DIAGNOSIS — Z888 Allergy status to other drugs, medicaments and biological substances status: Secondary | ICD-10-CM

## 2019-04-12 DIAGNOSIS — E876 Hypokalemia: Principal | ICD-10-CM | POA: Diagnosis present

## 2019-04-12 DIAGNOSIS — Z96651 Presence of right artificial knee joint: Secondary | ICD-10-CM | POA: Diagnosis present

## 2019-04-12 DIAGNOSIS — F329 Major depressive disorder, single episode, unspecified: Secondary | ICD-10-CM | POA: Diagnosis present

## 2019-04-12 DIAGNOSIS — Z885 Allergy status to narcotic agent status: Secondary | ICD-10-CM

## 2019-04-12 LAB — RESPIRATORY PANEL BY RT PCR (FLU A&B, COVID)
Influenza A by PCR: NEGATIVE
Influenza B by PCR: NEGATIVE
SARS Coronavirus 2 by RT PCR: NEGATIVE

## 2019-04-12 LAB — COMPREHENSIVE METABOLIC PANEL
ALT: 27 U/L (ref 0–44)
AST: 26 U/L (ref 15–41)
Albumin: 4.2 g/dL (ref 3.5–5.0)
Alkaline Phosphatase: 61 U/L (ref 38–126)
Anion gap: 12 (ref 5–15)
BUN: 16 mg/dL (ref 6–20)
CO2: 22 mmol/L (ref 22–32)
Calcium: 9.3 mg/dL (ref 8.9–10.3)
Chloride: 100 mmol/L (ref 98–111)
Creatinine, Ser: 1.04 mg/dL — ABNORMAL HIGH (ref 0.44–1.00)
GFR calc Af Amer: >60 mL/min (ref >60)
GFR calc non Af Amer: 59 mL/min — ABNORMAL LOW (ref >60)
Glucose, Bld: 104 mg/dL — ABNORMAL HIGH (ref 70–99)
Potassium: 2.6 mmol/L — CL (ref 3.5–5.1)
Sodium: 134 mmol/L — ABNORMAL LOW (ref 135–145)
Total Bilirubin: 0.8 mg/dL (ref 0.3–1.2)
Total Protein: 8.2 g/dL — ABNORMAL HIGH (ref 6.5–8.1)

## 2019-04-12 LAB — URINALYSIS, ROUTINE W REFLEX MICROSCOPIC
Bilirubin Urine: NEGATIVE
Glucose, UA: NEGATIVE mg/dL
Hgb urine dipstick: NEGATIVE
Ketones, ur: NEGATIVE mg/dL
Nitrite: NEGATIVE
Protein, ur: NEGATIVE mg/dL
Specific Gravity, Urine: 1.023 (ref 1.005–1.030)
pH: 5 (ref 5.0–8.0)

## 2019-04-12 LAB — CBC WITH DIFFERENTIAL/PLATELET
Abs Immature Granulocytes: 0.13 10*3/uL — ABNORMAL HIGH (ref 0.00–0.07)
Basophils Absolute: 0 10*3/uL (ref 0.0–0.1)
Basophils Relative: 0 %
Eosinophils Absolute: 0 10*3/uL (ref 0.0–0.5)
Eosinophils Relative: 0 %
HCT: 41.1 % (ref 36.0–46.0)
Hemoglobin: 13.6 g/dL (ref 12.0–15.0)
Immature Granulocytes: 1 %
Lymphocytes Relative: 3 %
Lymphs Abs: 0.4 10*3/uL — ABNORMAL LOW (ref 0.7–4.0)
MCH: 29.4 pg (ref 26.0–34.0)
MCHC: 33.1 g/dL (ref 30.0–36.0)
MCV: 89 fL (ref 80.0–100.0)
Monocytes Absolute: 1 10*3/uL (ref 0.1–1.0)
Monocytes Relative: 7 %
Neutro Abs: 12.9 10*3/uL — ABNORMAL HIGH (ref 1.7–7.7)
Neutrophils Relative %: 89 %
Platelets: 369 10*3/uL (ref 150–400)
RBC: 4.62 MIL/uL (ref 3.87–5.11)
RDW: 14.7 % (ref 11.5–15.5)
WBC: 14.5 10*3/uL — ABNORMAL HIGH (ref 4.0–10.5)
nRBC: 0 % (ref 0.0–0.2)

## 2019-04-12 LAB — LACTIC ACID, PLASMA: Lactic Acid, Venous: 1.5 mmol/L (ref 0.5–1.9)

## 2019-04-12 LAB — LIPASE, BLOOD: Lipase: 20 U/L (ref 11–51)

## 2019-04-12 LAB — MAGNESIUM: Magnesium: 1.9 mg/dL (ref 1.7–2.4)

## 2019-04-12 MED ORDER — IOHEXOL 350 MG/ML SOLN
100.0000 mL | Freq: Once | INTRAVENOUS | Status: AC | PRN
  Administered 2019-04-12: 100 mL via INTRAVENOUS

## 2019-04-12 MED ORDER — POTASSIUM CHLORIDE 10 MEQ/100ML IV SOLN
10.0000 meq | INTRAVENOUS | Status: AC
  Administered 2019-04-12 (×2): 10 meq via INTRAVENOUS
  Filled 2019-04-12 (×2): qty 100

## 2019-04-12 MED ORDER — HEPARIN SODIUM (PORCINE) 5000 UNIT/ML IJ SOLN
5000.0000 [IU] | Freq: Three times a day (TID) | INTRAMUSCULAR | Status: DC
  Administered 2019-04-12 – 2019-04-13 (×4): 5000 [IU] via SUBCUTANEOUS
  Filled 2019-04-12 (×4): qty 1

## 2019-04-12 MED ORDER — ONDANSETRON 4 MG PO TBDP: 4.0000 mg | ORAL_TABLET | Freq: Four times a day (QID) | ORAL | Status: DC | PRN

## 2019-04-12 MED ORDER — METHOCARBAMOL 1000 MG/10ML IJ SOLN
500.0000 mg | Freq: Four times a day (QID) | INTRAVENOUS | Status: DC | PRN
  Filled 2019-04-12: qty 5

## 2019-04-12 MED ORDER — PIPERACILLIN-TAZOBACTAM 3.375 G IVPB 30 MIN
3.3750 g | Freq: Once | INTRAVENOUS | Status: AC
  Administered 2019-04-12: 3.375 g via INTRAVENOUS
  Filled 2019-04-12: qty 50

## 2019-04-12 MED ORDER — KCL IN DEXTROSE-NACL 20-5-0.9 MEQ/L-%-% IV SOLN
INTRAVENOUS | Status: DC
  Filled 2019-04-12 (×3): qty 1000

## 2019-04-12 MED ORDER — SODIUM CHLORIDE (PF) 0.9 % IJ SOLN
INTRAMUSCULAR | Status: AC
  Filled 2019-04-12: qty 50

## 2019-04-12 MED ORDER — MORPHINE SULFATE (PF) 4 MG/ML IV SOLN
4.0000 mg | Freq: Once | INTRAVENOUS | Status: AC
  Administered 2019-04-12: 4 mg via INTRAVENOUS
  Filled 2019-04-12: qty 1

## 2019-04-12 MED ORDER — SODIUM CHLORIDE 0.9 % IV BOLUS
1000.0000 mL | Freq: Once | INTRAVENOUS | Status: AC
  Administered 2019-04-12: 1000 mL via INTRAVENOUS

## 2019-04-12 MED ORDER — ONDANSETRON HCL 4 MG/2ML IJ SOLN: 4.0000 mg | Freq: Four times a day (QID) | INTRAMUSCULAR | Status: DC | PRN

## 2019-04-12 MED ORDER — POTASSIUM CHLORIDE 10 MEQ/100ML IV SOLN
10.0000 meq | INTRAVENOUS | Status: AC
  Administered 2019-04-12 – 2019-04-13 (×3): 10 meq via INTRAVENOUS
  Filled 2019-04-12: qty 100

## 2019-04-12 MED ORDER — SODIUM CHLORIDE 0.9 % IV BOLUS
1000.0000 mL | Freq: Once | INTRAVENOUS | Status: AC
  Administered 2019-04-12: 20:00:00 1000 mL via INTRAVENOUS

## 2019-04-12 MED ORDER — PANTOPRAZOLE SODIUM 40 MG IV SOLR
40.0000 mg | Freq: Every day | INTRAVENOUS | Status: DC
  Administered 2019-04-12 – 2019-04-13 (×2): 40 mg via INTRAVENOUS
  Filled 2019-04-12 (×2): qty 40

## 2019-04-12 MED ORDER — ONDANSETRON HCL 4 MG/2ML IJ SOLN
4.0000 mg | Freq: Four times a day (QID) | INTRAMUSCULAR | Status: DC | PRN
  Administered 2019-04-12: 23:00:00 4 mg via INTRAVENOUS
  Filled 2019-04-12: qty 2

## 2019-04-12 MED ORDER — POTASSIUM CHLORIDE 10 MEQ/100ML IV SOLN
10.0000 meq | Freq: Once | INTRAVENOUS | Status: AC
  Administered 2019-04-12: 10 meq via INTRAVENOUS
  Filled 2019-04-12: qty 100

## 2019-04-12 MED ORDER — FENTANYL CITRATE (PF) 100 MCG/2ML IJ SOLN
25.0000 ug | INTRAMUSCULAR | Status: DC | PRN
  Administered 2019-04-12 – 2019-04-13 (×2): 25 ug via INTRAVENOUS
  Filled 2019-04-12 (×2): qty 2

## 2019-04-12 MED ORDER — PIPERACILLIN-TAZOBACTAM 3.375 G IVPB
3.3750 g | Freq: Three times a day (TID) | INTRAVENOUS | Status: DC
  Administered 2019-04-13 – 2019-04-14 (×4): 3.375 g via INTRAVENOUS
  Filled 2019-04-12 (×5): qty 50

## 2019-04-12 NOTE — H&P (Signed)
[name] [name] is an 59 y.o. female.   Chief Complaint: Nausea and vomiting HPI: The patient is a 59 year old white female who is about 3 weeks status post ventral hernia repair with mesh.  She was doing well until earlier today when she developed nausea and vomiting.  She did complain of some mild lower abdominal pain.  She states that she ran a fever of 100.8.  She came to the emergency department where a CT scan did show postoperative changes of the abdominal wall.  There was a possibility of a small developing abscess in the left intra-abdominal cavity.  There was no evidence of bowel perforation.  Past Medical History:  Diagnosis Date  . Anxiety disorder   . Depression   . 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 Diverticulitis   . Gallstones   . GERD (gastroesophageal reflux disease)   . History of kidney stones   . Hyperlipidemia   . Hypertension   . Obesity   . Pneumonia    walking pneumonia historical   . Renal disorder    kidney stones  . Rhinitis   . 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 CESAREAN SECTION    . COLECTOMY WITH COLOSTOMY CREATION/HARTMANN PROCEDURE N/A 06/10/2016   Procedure: OPEN SIGMOID COLECTOMY WITH DRAINAGE OF ABDOMINAL ABSCESS AND MOBILIZATION OF SPLENIC FLEXURE;  Surgeon: Georganna Skeans, MD;  Location: [facility];  Service: General;  Laterality: N/A;  . COLOSTOMY N/A 06/10/2016   Procedure: COLOSTOMY WITH HARTMANN PROCEDURE;  Surgeon: Georganna Skeans, MD;  Location: [facility];  Service: General;  Laterality: N/A;  . COLOSTOMY TAKEDOWN N/A 12/10/2016   Procedure: LAPAROSCOPIC COLOSTOMY REVERSAL;  Surgeon: Leighton Ruff, MD;  Location: [facility];  Service: General;  Laterality: N/A;  . INCISIONAL HERNIA REPAIR N/A 03/23/2019   Procedure: LAPAROSCOPIC CONVERTED TO OPEN INCISIONAL HERNIA REPAIR WITH MESH;  Surgeon: [name], MD;  Location: [facility];   Service: General;  Laterality: N/A;  . LAPAROSCOPIC CHOLECYSTECTOMY W/ CHOLANGIOGRAPHY  2012   Dr Georganna Skeans  . TONSILLECTOMY     as child-tonsils and adenoids  . TOTAL KNEE ARTHROPLASTY  11/23/2011   Dr Alvan Dame.  Procedure: TOTAL KNEE ARTHROPLASTY;  Surgeon: Mauri Pole, MD;  Location: [facility];  Service: Orthopedics;  Laterality: Right;  . uterine ablation  2003    Family History  Problem Relation Age of Onset  . Hypertension Mother   . Bone cancer Father   . Bladder Cancer Father   . Colon cancer Neg Hx    Social History:  reports that she has never smoked. She has never used smokeless tobacco. She reports that she does not drink alcohol or use drugs.  Allergies:  Allergies  Allergen Reactions  . Dilaudid [Hydromorphone Hcl] Itching  . Quinapril Hcl Hives and Swelling  . Yellow Jacket Venom [Bee Venom] Swelling    Swelling at site of bite    (Not in a hospital admission)   Results for orders placed or performed during the hospital encounter of 04/12/19 (from the past 48 hour(s))  Urinalysis, Routine w reflex microscopic     Status: Abnormal   Collection Time: 04/12/19  2:17 PM  Result Value Ref Range   Color, Urine YELLOW YELLOW   APPearance HAZY (A) CLEAR   Specific Gravity, Urine 1.023 1.005 - 1.030   pH 5.0 5.0 - 8.0   Glucose, UA NEGATIVE NEGATIVE  mg/dL   Hgb urine dipstick NEGATIVE NEGATIVE   Bilirubin Urine NEGATIVE NEGATIVE   Ketones, ur NEGATIVE NEGATIVE mg/dL   Protein, ur NEGATIVE NEGATIVE mg/dL   Nitrite NEGATIVE NEGATIVE   Leukocytes,Ua TRACE (A) NEGATIVE   RBC / HPF 0-5 0 - 5 RBC/hpf   Bacteria, UA MANY (A) NONE SEEN   Squamous Epithelial / LPF 0-5 0 - 5   Mucus PRESENT     Comment: Performed at [facility], [place] [address] [place], [place] [address]  Comprehensive metabolic panel     Status: Abnormal   Collection Time: 04/12/19  2:44 PM  Result Value Ref Range   Sodium 134 (L) 135 - 145 mmol/L   Potassium 2.6 (LL) 3.5 - 5.1  mmol/L    Comment: CRITICAL RESULT CALLED TO, READ BACK BY AND VERIFIED WITH: S.WEST AT 1549 ON 04/12/19 BY N.THOMPSON    Chloride 100 98 - 111 mmol/L   CO2 22 22 - 32 mmol/L   Glucose, Bld 104 (H) 70 - 99 mg/dL   BUN 16 6 - 20 mg/dL   Creatinine, Ser 1.04 (H) 0.44 - 1.00 mg/dL   Calcium 9.3 8.9 - 10.3 mg/dL   Total Protein 8.2 (H) 6.5 - 8.1 g/dL   Albumin 4.2 3.5 - 5.0 g/dL   AST 26 15 - 41 U/L   ALT 27 0 - 44 U/L   Alkaline Phosphatase 61 38 - 126 U/L   Total Bilirubin 0.8 0.3 - 1.2 mg/dL   GFR calc non Af Amer 59 (L) >60 mL/min   GFR calc Af Amer >60 >60 mL/min   Anion gap 12 5 - 15    Comment: Performed at [facility], [place] [address], [place], [place] [address]  CBC with Differential     Status: Abnormal   Collection Time: 04/12/19  2:44 PM  Result Value Ref Range   WBC 14.5 (H) 4.0 - 10.5 K/uL   RBC 4.62 3.87 - 5.11 MIL/uL   Hemoglobin 13.6 12.0 - 15.0 g/dL   HCT 41.1 36.0 - 46.0 %   MCV 89.0 80.0 - 100.0 fL   MCH 29.4 26.0 - 34.0 pg   MCHC 33.1 30.0 - 36.0 g/dL   RDW 14.7 11.5 - 15.5 %   Platelets 369 150 - 400 K/uL   nRBC 0.0 0.0 - 0.2 %   Neutrophils Relative % 89 %   Neutro Abs 12.9 (H) 1.7 - 7.7 K/uL   Lymphocytes Relative 3 %   Lymphs Abs 0.4 (L) 0.7 - 4.0 K/uL   Monocytes Relative 7 %   Monocytes Absolute 1.0 0.1 - 1.0 K/uL   Eosinophils Relative 0 %   Eosinophils Absolute 0.0 0.0 - 0.5 K/uL   Basophils Relative 0 %   Basophils Absolute 0.0 0.0 - 0.1 K/uL   Immature Granulocytes 1 %   Abs Immature Granulocytes 0.13 (H) 0.00 - 0.07 K/uL    Comment: Performed at [facility], [place] [address], [place], [place] [address]  Lipase, blood     Status: None   Collection Time: 04/12/19  2:44 PM  Result Value Ref Range   Lipase 20 11 - 51 U/L    Comment: Performed at [facility], [place] [address], [place], [place] [address]  Lactic acid, plasma     Status: None   Collection Time: 04/12/19  2:44 PM  Result  Value Ref Range   Lactic Acid, Venous 1.5 0.5 - 1.9 mmol/L  Comment: Performed at [facility], [place] [address], [place], [place] [address]  Magnesium     Status: None   Collection Time: 04/12/19  4:10 PM  Result Value Ref Range   Magnesium 1.9 1.7 - 2.4 mg/dL    Comment: Performed at [facility], [place] [address] [place], [place] [address]  Respiratory Panel by RT PCR (Flu A&B, Covid) - Nasopharyngeal Swab     Status: None   Collection Time: 04/12/19  5:49 PM   Specimen: Nasopharyngeal Swab  Result Value Ref Range   SARS Coronavirus 2 by RT PCR NEGATIVE NEGATIVE    Comment: (NOTE) SARS-CoV-2 target nucleic acids are NOT DETECTED. The SARS-CoV-2 RNA is generally detectable in upper respiratoy specimens during the acute phase of infection. The lowest concentration of SARS-CoV-2 viral copies this assay can detect is 131 copies/mL. A negative result does not preclude SARS-Cov-2 infection and should not be used as the sole basis for treatment or other patient management decisions. A negative result may occur with  improper specimen collection/handling, submission of specimen other than nasopharyngeal swab, presence of viral mutation(s) within the areas targeted by this assay, and inadequate number of viral copies (<131 copies/mL). A negative result must be combined with clinical observations, patient history, and epidemiological information. The expected result is Negative. Fact Sheet for Patients:  PinkCheek.be Fact Sheet for Healthcare Providers:  GravelBags.it This test is not yet ap proved or cleared by the [place] FDA and  has been authorized for detection and/or diagnosis of SARS-CoV-2 by FDA under an Emergency Use Authorization (EUA). This EUA will remain  in effect (meaning this test can be used) for the duration of the COVID-19 declaration under Section 564(b)(1) of the Act,  21 U.S.C. section 360bbb-3(b)(1), unless the authorization is terminated or revoked sooner.    Influenza A by PCR NEGATIVE NEGATIVE   Influenza B by PCR NEGATIVE NEGATIVE    Comment: (NOTE) The Xpert Xpress SARS-CoV-2/FLU/RSV assay is intended as an aid in  the diagnosis of influenza from Nasopharyngeal swab specimens and  should not be used as a sole basis for treatment. Nasal washings and  aspirates are unacceptable for Xpert Xpress SARS-CoV-2/FLU/RSV  testing. Fact Sheet for Patients: PinkCheek.be Fact Sheet for Healthcare Providers: GravelBags.it This test is not yet approved or cleared by the [place] FDA and  has been authorized for detection and/or diagnosis of SARS-CoV-2 by  FDA under an Emergency Use Authorization (EUA). This EUA will remain  in effect (meaning this test can be used) for the duration of the  Covid-19 declaration under Section 564(b)(1) of the Act, 21  U.S.C. section 360bbb-3(b)(1), unless the authorization is  terminated or revoked. Performed at [facility], [place] [address] [place], [place] [address]    CT Angio Chest PE W and/or Wo Contrast  Result Date: 04/12/2019 CLINICAL DATA:  Shortness of breath, recent surgery EXAM: CT ANGIOGRAPHY CHEST WITH CONTRAST TECHNIQUE: Multidetector CT imaging of the chest was performed using the standard protocol during bolus administration of intravenous contrast. Multiplanar CT image reconstructions and MIPs were obtained to evaluate the vascular anatomy. CONTRAST:  145mL OMNIPAQUE IOHEXOL 350 MG/ML SOLN COMPARISON:  Same day chest x-ray FINDINGS: Cardiovascular: Satisfactory opacification of the pulmonary arteries to the segmental level. Respiratory motion slightly degrades evaluation of the distal pulmonary arterial levels. No evidence of pulmonary embolism. Normal heart size. No pericardial effusion. Thoracic aorta is nonaneurysmal. Scattered  atherosclerotic calcifications of the aortic arch. Mediastinum/Nodes: No enlarged  mediastinal, hilar, or axillary lymph nodes. Small hiatal hernia. Thyroid gland, trachea, and esophagus demonstrate no significant findings. Lungs/Pleura: Linear area of scarring versus atelectasis in the anterior aspect of the right lung apex. Mild bibasilar subsegmental atelectasis. No focal airspace consolidation, pleural effusion, or pneumothorax. Upper Abdomen: There are a few tiny foci of air anterior to the liver within the abdomen (series 2, images 72 and 77) air within the subcutaneous soft tissues of the anterior abdominal wall partially visualized and presumably related to recent abdominal surgery. Musculoskeletal: No chest wall abnormality. No acute or significant osseous findings. Review of the MIP images confirms the above findings. IMPRESSION: 1. No evidence of pulmonary embolism to the segmental branch level. 2. Trace pneumoperitoneum within the abdomen. Findings are presumably related to recent abdominal surgery. Please see dedicated abdominopelvic CT report for full evaluation of the findings within the abdomen. Aortic Atherosclerosis (ICD10-I70.0). Electronically Signed   By: Davina Poke D.O.   On: 04/12/2019 16:49   CT ABDOMEN PELVIS W CONTRAST  Result Date: 04/12/2019 CLINICAL DATA:  59 year old female with hernia surgery 2 weeks ago presenting with abdominal pain. EXAM: CT ABDOMEN AND PELVIS WITH CONTRAST TECHNIQUE: Multidetector CT imaging of the abdomen and pelvis was performed using the standard protocol following bolus administration of intravenous contrast. CONTRAST:  113mL OMNIPAQUE IOHEXOL 350 MG/ML SOLN COMPARISON:  CT abdomen pelvis dated 04/18/2018. FINDINGS: Lower chest: Bibasilar dependent atelectasis. The visualized lung bases are otherwise clear. Partially visualized calcification of the aortic valve annulus. Two tiny foci of air anterior to the liver, likely postsurgical. Clinical  correlation recommended. Hepatobiliary: Probable mild fatty infiltration of the liver. No intrahepatic biliary ductal dilatation. Cholecystectomy. No retained calcified stone noted in the central CBD. Pancreas: Unremarkable. No pancreatic ductal dilatation or surrounding inflammatory changes. Spleen: Several faint splenic hypodense lesions measuring up to 10 mm are not characterized but may represent cysts or hemangioma. Adrenals/Urinary Tract: The adrenal glands are unremarkable. There is no hydronephrosis on either side. There is symmetric enhancement and excretion of contrast by both kidneys. Small bilateral renal hypodense lesions are suboptimally characterized on this CT, possibly cysts. These can be better evaluated with ultrasound on a nonemergent basis. The visualized ureters are unremarkable. The urinary bladder is minimally distended. There is apparent diffuse thickening of the bladder wall which may be partly related to underdistention. Cystitis is not excluded. Correlation with urinalysis recommended. Stomach/Bowel: Postsurgical changes of partial sigmoid resection with anastomotic suture. There is no bowel obstruction or active inflammation. The appendix is normal. Vascular/Lymphatic: Mild aortoiliac atherosclerotic disease. The IVC is unremarkable. No portal venous gas. There is no adenopathy. Reproductive: The uterus and ovaries are grossly unremarkable. Other: There is stranding of the mesentery in the right lower quadrant anteriorly. A 2.9 x 1.9 cm collection (series 4, image 60 and coronal series 6 image 43) is concerning for developing abscess. There is postsurgical changes of ventral hernia repair. There are apparent small areas of defect within the hernia repair with slight protrusion of the omental fat (for example axial series 4, image 55). There is diffuse stranding and edema of the subcutaneous soft tissues. Two drainage tubes noted. There is a midline vertical anterior pelvic wall surgical  incision and overlying cutaneous staples. No drainable fluid collection identified in the anterior abdominal wall. There is small amount of air in the subcutaneous soft tissues of the left anterior upper abdominal wall. Musculoskeletal: Grade 1 L5-S1 anterolisthesis. No acute osseous pathology. IMPRESSION: 1. Postsurgical changes of ventral hernia repair with small  areas of defect within the hernia repair and protrusion of small omental fat. 2. Loculated appearing collection in the right lower abdomen anteriorly, likely a developing small abscess. 3. Punctate foci of air in the upper abdomen, possibly postsurgical. Clinical correlation is recommended. 4. Postsurgical changes of the anterior abdominal wall with stranding of the subcutaneous soft tissues. No drainable fluid collection or abscess identified in the abdominal wall. Electronically Signed   By: Anner Crete M.D.   On: 04/12/2019 16:58   DG Chest Portable 1 View  Result Date: 04/12/2019 CLINICAL DATA:  Shortness of breath EXAM: PORTABLE CHEST 1 VIEW COMPARISON:  November 17, 2011 FINDINGS: The lungs are clear. The heart size and pulmonary vascularity are normal. No evident adenopathy. There is evidence of an old healed fracture of the left clavicle. No pneumothorax. IMPRESSION: Lungs clear. Cardiac silhouette within normal limits. No evident adenopathy. Electronically Signed   By: Lowella Grip III M.D.   On: 04/12/2019 15:36    Review of Systems  Constitutional: Negative.   HENT: Negative.   Eyes: Negative.   Respiratory: Negative.   Cardiovascular: Negative.   Gastrointestinal: Positive for constipation, nausea and vomiting.  Endocrine: Negative.   Genitourinary: Negative.   Musculoskeletal: Negative.   Skin: Negative.   Allergic/Immunologic: Negative.   Neurological: Negative.   Hematological: Negative.   Psychiatric/Behavioral: Negative.     Blood pressure 115/72, pulse 93, temperature 99.1 F (37.3 C), resp. rate 20,  height 5\' 5"  (1.651 m), weight 109.8 kg, SpO2 97 %. Physical Exam  Constitutional: She is oriented to person, place, and time.  Obese wf in nad  HENT:  Head: Normocephalic and atraumatic.  Right Ear: External ear normal.  Left Ear: External ear normal.  Nose: Nose normal.  Mouth/Throat: Oropharynx is clear and moist.  Eyes: Pupils are equal, round, and reactive to light. Conjunctivae and EOM are normal. No scleral icterus.  Neck: No thyromegaly present.  Cardiovascular: Normal rate, regular rhythm, normal heart sounds and intact distal pulses.  No pitting edema lower extr  Respiratory: Effort normal and breath sounds normal. No respiratory distress.  GI: Soft. Bowel sounds are normal.  Minimal tenderness. Incision ok. Drain output grayish. No evidence of recurrent hernia  Musculoskeletal:        General: Normal range of motion.     Cervical back: Normal range of motion and neck supple.     Comments: Gait not evaluated as she is lying in bed. 5/5 strength upper and lower extr and equal  Lymphadenopathy:    She has no cervical adenopathy.  No palpable groin or cervical lymphadenopathy  Neurological: She is alert and oriented to person, place, and time.  Skin: Skin is warm and dry. No rash noted.  Psychiatric: She has a normal mood and affect. Her behavior is normal. Thought content normal.     Assessment/Plan The patient is about 3 weeks status post open ventral hernia repair with mesh.  There is a possibility she is developing a small intra-abdominal abscess.  Because of this we will admit her to the hospital and hydrate her and start her on broad-spectrum antibiotic therapy.  I will recheck her white count tomorrow.  She also has hypokalemia so we will replace her potassium which may also improve her nausea and vomiting.  I will notify the primary team and will see how she does.  Autumn Messing III, MD 04/12/2019, 8:05 PM

## 2019-04-12 NOTE — ED Provider Notes (Addendum)
[facility] DEPT Provider Note   CSN: [id number] Arrival date & time: 04/12/19  1317     History Chief Complaint  Patient presents with  . Abdominal Pain    [name] [name] is a 59 y.o. female.  HPI      [name] [name] is a 59 y.o. female, with a history of HTN, hyperlipidemia, recent hernia repair, GERD, presenting to the ED with abdominal pain beginning yesterday.  Pain is intermittent, lower abdomen, aching, moderate in intensity, nonradiating.  She also notes a couple episodes of nonbloody nonbilious vomiting beginning this morning and a fever of 100.8 F shortly after waking around 7:30 AM.  She took a dose of Tylenol, which improved fever.  Nausea improved with Zofran given via EMS. Patient also has developed intermittent shortness of breath today, now improved upon ED arrival. She underwent laparoscopic converted to open mesh repair of 2 incisional hernias, performed by Dr. Ninfa Linden, January 28.  Postop appointment scheduled for February 19. Her last bowel movement was 2 days ago, was soft, and occurred after taking a stool softener. She thinks she also has some urinary frequency.  Denies acute back pain, hematochezia/melena, chest pain, cough, dysuria, hematuria, lower extremity pain/swelling, or any other complaints.  Past Medical History:  Diagnosis Date  . Anxiety disorder   . Depression   . 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 Diverticulitis   . Gallstones   . GERD (gastroesophageal reflux disease)   . History of kidney stones   . Hyperlipidemia   . Hypertension   . Obesity   . Pneumonia    walking pneumonia historical   . Renal disorder    kidney stones  . Rhinitis   . 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 Vomiting 04/12/2019  . Incisional hernia 03/23/2019  . Anxiety disorder   . Rhinitis   .  Diverticulitis of colon s/p colostomy takedown 12/10/2016 12/11/2016  . Hypertension   . Hyperlipidemia   . 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 Expected blood loss anemia 11/24/2011  . Obese 11/24/2011  . Hypokalemia 11/24/2011  . Gastritis 11/04/2011  . DYSPHAGIA 01/10/2008    Past Surgical History:  Procedure Laterality Date  . CESAREAN SECTION    . COLECTOMY WITH COLOSTOMY CREATION/HARTMANN PROCEDURE N/A 06/10/2016   Procedure: OPEN SIGMOID COLECTOMY WITH DRAINAGE OF ABDOMINAL ABSCESS AND MOBILIZATION OF SPLENIC FLEXURE;  Surgeon: Georganna Skeans, MD;  Location: [facility];  Service: General;  Laterality: N/A;  . COLOSTOMY N/A 06/10/2016   Procedure: COLOSTOMY WITH HARTMANN PROCEDURE;  Surgeon: Georganna Skeans, MD;  Location: [facility];  Service: General;  Laterality: N/A;  . COLOSTOMY TAKEDOWN N/A 12/10/2016   Procedure: LAPAROSCOPIC COLOSTOMY REVERSAL;  Surgeon: Leighton Ruff, MD;  Location: [facility];  Service: General;  Laterality: N/A;  . INCISIONAL HERNIA REPAIR N/A 03/23/2019   Procedure: LAPAROSCOPIC CONVERTED TO OPEN INCISIONAL HERNIA REPAIR WITH MESH;  Surgeon: [name], MD;  Location: [facility];  Service: General;  Laterality: N/A;  . LAPAROSCOPIC CHOLECYSTECTOMY W/ CHOLANGIOGRAPHY  2012   Dr Georganna Skeans  . TONSILLECTOMY     as child-tonsils and adenoids  . TOTAL KNEE ARTHROPLASTY  11/23/2011   Dr Alvan Dame.  Procedure: TOTAL KNEE ARTHROPLASTY;  Surgeon: Mauri Pole, MD;  Location: [facility];  Service: Orthopedics;  Laterality: Right;  . uterine  ablation  2003     OB History   No obstetric history on file.     Family History  Problem Relation Age of Onset  . Hypertension Mother   . Bone cancer Father   . Bladder Cancer Father   . Colon cancer Neg Hx     Social History   Tobacco Use  . Smoking status: Never Smoker  . Smokeless tobacco: Never Used  Substance Use Topics  . Alcohol use: No  . Drug use: No    Home  Medications Prior to Admission medications   Medication Sig Start Date End Date Taking? Authorizing Provider  acetaminophen (TYLENOL) 500 MG tablet Take 1,000 mg by mouth every 6 (six) hours as needed for moderate pain.   Yes [provider]  Cholecalciferol (VITAMIN D3) 1000 units CAPS Take 1,000 Units by mouth daily.    Yes [provider]  fluticasone (FLONASE) 50 MCG/ACT nasal spray Place 1 spray into both nostrils daily as needed for allergies.  07/10/13  Yes [provider]  loratadine (CLARITIN) 10 MG tablet Take 10 mg by mouth daily as needed (seasonal allergies).   Yes [provider]  methocarbamol (ROBAXIN) 500 MG tablet Take 1 tablet (500 mg total) by mouth every 6 (six) hours as needed for muscle spasms. 03/25/19  Yes Kinsinger, Arta Bruce, MD  naproxen sodium (ALEVE) 220 MG tablet Take 440 mg by mouth daily as needed (pain).   Yes [provider]  pantoprazole (PROTONIX) 40 MG tablet Take 40 mg by mouth daily.    Yes [provider]  Psyllium (METAMUCIL PO) Take 1 Dose by mouth daily.   Yes [provider]  rOPINIRole (REQUIP) 0.25 MG tablet Take 0.25 mg by mouth at bedtime.  05/05/15  Yes [provider]  sertraline (ZOLOFT) 100 MG tablet Take 100 mg by mouth daily.    Yes [provider]  triamterene-hydrochlorothiazide (MAXZIDE-25) 37.5-25 MG per tablet Take 1 tablet by mouth daily.   Yes [provider]  oxyCODONE (OXY IR/ROXICODONE) 5 MG immediate release tablet Take 1 tablet (5 mg total) by mouth every 6 (six) hours as needed for moderate pain or severe pain. Patient not taking: Reported on 04/12/2019 03/24/19   [name], MD    Allergies    Dilaudid [hydromorphone hcl], Quinapril hcl, and Yellow jacket venom [bee venom]  Review of Systems   Review of Systems  Constitutional: Positive for fever.  Respiratory: Positive for shortness of breath. Negative for cough.   Cardiovascular:  Negative for chest pain and leg swelling.  Gastrointestinal: Positive for abdominal pain, nausea and vomiting.  Genitourinary: Positive for frequency. Negative for dysuria, flank pain and hematuria.  Musculoskeletal: Negative for back pain.  Neurological: Negative for dizziness and syncope.  All other systems reviewed and are negative.   Physical Exam Updated Vital Signs BP 129/71   Pulse 99   Temp 98.5 F (36.9 C) (Oral)   Resp (!) 24   Ht 5\' 5"  (1.651 m)   Wt 109.8 kg   SpO2 99%   BMI 40.27 kg/m   Physical Exam Vitals and nursing note reviewed.  Constitutional:      General: She is not in acute distress.    Appearance: She is well-developed. She is obese. She is not diaphoretic.  HENT:     Head: Normocephalic and atraumatic.     Mouth/Throat:     Mouth: Mucous membranes are moist.     Pharynx: Oropharynx is clear.  Eyes:  Conjunctiva/sclera: Conjunctivae normal.  Cardiovascular:     Rate and Rhythm: Normal rate and regular rhythm.     Pulses: Normal pulses.          Radial pulses are 2+ on the right side and 2+ on the left side.       Posterior tibial pulses are 2+ on the right side and 2+ on the left side.     Heart sounds: Normal heart sounds.     Comments: Tactile temperature in the extremities appropriate and equal bilaterally. Intermittently, mildly tachycardic. Pulmonary:     Effort: Pulmonary effort is normal. No respiratory distress.     Breath sounds: Normal breath sounds.     Comments: No increased work of breathing.  Speaks in full sentences without noted difficulty. Abdominal:     Palpations: Abdomen is soft.     Tenderness: There is abdominal tenderness. There is no guarding.       Comments: Seemingly mild tenderness in the region shown.  Surgical incision is midline and is covered by postsurgical dressing. She has 2 JP drains with thin, dark, serosanguineous appearing fluid in both.  Area around drain insertions are nontender and without noted  swelling or erythema.  Musculoskeletal:     Cervical back: Neck supple.     Right lower leg: No edema.     Left lower leg: No edema.  Lymphadenopathy:     Cervical: No cervical adenopathy.  Skin:    General: Skin is warm and dry.  Neurological:     Mental Status: She is alert.  Psychiatric:        Mood and Affect: Mood and affect normal.        Speech: Speech normal.        Behavior: Behavior normal.     ED Results / Procedures / Treatments   Labs (all labs ordered are listed, but only abnormal results are displayed) Labs Reviewed  COMPREHENSIVE METABOLIC PANEL - Abnormal; Notable for the following components:      Result Value   Sodium 134 (*)    Potassium 2.6 (*)    Glucose, Bld 104 (*)    Creatinine, Ser 1.04 (*)    Total Protein 8.2 (*)    GFR calc non Af Amer 59 (*)    All other components within normal limits  CBC WITH DIFFERENTIAL/PLATELET - Abnormal; Notable for the following components:   WBC 14.5 (*)    Neutro Abs 12.9 (*)    Lymphs Abs 0.4 (*)    Abs Immature Granulocytes 0.13 (*)    All other components within normal limits  URINALYSIS, ROUTINE W REFLEX MICROSCOPIC - Abnormal; Notable for the following components:   APPearance HAZY (*)    Leukocytes,Ua TRACE (*)    Bacteria, UA MANY (*)    All other components within normal limits  RESPIRATORY PANEL BY RT PCR (FLU A&B, COVID)  URINE CULTURE  CULTURE, BLOOD (ROUTINE X 2)  CULTURE, BLOOD (ROUTINE X 2)  LIPASE, BLOOD  LACTIC ACID, PLASMA  MAGNESIUM    EKG EKG Interpretation  Date/Time:  Wednesday April 12 2019 13:29:21 EST Ventricular Rate:  100 PR Interval:    QRS Duration: 92 QT Interval:  329 QTC Calculation: 423 R Axis:   -67 Text Interpretation: Sinus tachycardia Left anterior fascicular block Abnormal R-wave progression, late transition Borderline T wave abnormalities Since last tracing rate faster Confirmed by Wandra Arthurs [id number]) on 04/12/2019 2:56:49 PM   Radiology CT Angio Chest PE  W and/or  Wo Contrast  Result Date: 04/12/2019 CLINICAL DATA:  Shortness of breath, recent surgery EXAM: CT ANGIOGRAPHY CHEST WITH CONTRAST TECHNIQUE: Multidetector CT imaging of the chest was performed using the standard protocol during bolus administration of intravenous contrast. Multiplanar CT image reconstructions and MIPs were obtained to evaluate the vascular anatomy. CONTRAST:  160mL OMNIPAQUE IOHEXOL 350 MG/ML SOLN COMPARISON:  Same day chest x-ray FINDINGS: Cardiovascular: Satisfactory opacification of the pulmonary arteries to the segmental level. Respiratory motion slightly degrades evaluation of the distal pulmonary arterial levels. No evidence of pulmonary embolism. Normal heart size. No pericardial effusion. Thoracic aorta is nonaneurysmal. Scattered atherosclerotic calcifications of the aortic arch. Mediastinum/Nodes: No enlarged mediastinal, hilar, or axillary lymph nodes. Small hiatal hernia. Thyroid gland, trachea, and esophagus demonstrate no significant findings. Lungs/Pleura: Linear area of scarring versus atelectasis in the anterior aspect of the right lung apex. Mild bibasilar subsegmental atelectasis. No focal airspace consolidation, pleural effusion, or pneumothorax. Upper Abdomen: There are a few tiny foci of air anterior to the liver within the abdomen (series 2, images 72 and 77) air within the subcutaneous soft tissues of the anterior abdominal wall partially visualized and presumably related to recent abdominal surgery. Musculoskeletal: No chest wall abnormality. No acute or significant osseous findings. Review of the MIP images confirms the above findings. IMPRESSION: 1. No evidence of pulmonary embolism to the segmental branch level. 2. Trace pneumoperitoneum within the abdomen. Findings are presumably related to recent abdominal surgery. Please see dedicated abdominopelvic CT report for full evaluation of the findings within the abdomen. Aortic Atherosclerosis (ICD10-I70.0).  Electronically Signed   By: Davina Poke D.O.   On: 04/12/2019 16:49   CT ABDOMEN PELVIS W CONTRAST  Result Date: 04/12/2019 CLINICAL DATA:  59 year old female with hernia surgery 2 weeks ago presenting with abdominal pain. EXAM: CT ABDOMEN AND PELVIS WITH CONTRAST TECHNIQUE: Multidetector CT imaging of the abdomen and pelvis was performed using the standard protocol following bolus administration of intravenous contrast. CONTRAST:  172mL OMNIPAQUE IOHEXOL 350 MG/ML SOLN COMPARISON:  CT abdomen pelvis dated 04/18/2018. FINDINGS: Lower chest: Bibasilar dependent atelectasis. The visualized lung bases are otherwise clear. Partially visualized calcification of the aortic valve annulus. Two tiny foci of air anterior to the liver, likely postsurgical. Clinical correlation recommended. Hepatobiliary: Probable mild fatty infiltration of the liver. No intrahepatic biliary ductal dilatation. Cholecystectomy. No retained calcified stone noted in the central CBD. Pancreas: Unremarkable. No pancreatic ductal dilatation or surrounding inflammatory changes. Spleen: Several faint splenic hypodense lesions measuring up to 10 mm are not characterized but may represent cysts or hemangioma. Adrenals/Urinary Tract: The adrenal glands are unremarkable. There is no hydronephrosis on either side. There is symmetric enhancement and excretion of contrast by both kidneys. Small bilateral renal hypodense lesions are suboptimally characterized on this CT, possibly cysts. These can be better evaluated with ultrasound on a nonemergent basis. The visualized ureters are unremarkable. The urinary bladder is minimally distended. There is apparent diffuse thickening of the bladder wall which may be partly related to underdistention. Cystitis is not excluded. Correlation with urinalysis recommended. Stomach/Bowel: Postsurgical changes of partial sigmoid resection with anastomotic suture. There is no bowel obstruction or active inflammation.  The appendix is normal. Vascular/Lymphatic: Mild aortoiliac atherosclerotic disease. The IVC is unremarkable. No portal venous gas. There is no adenopathy. Reproductive: The uterus and ovaries are grossly unremarkable. Other: There is stranding of the mesentery in the right lower quadrant anteriorly. A 2.9 x 1.9 cm collection (series 4, image 60 and coronal series 6 image 43)  is concerning for developing abscess. There is postsurgical changes of ventral hernia repair. There are apparent small areas of defect within the hernia repair with slight protrusion of the omental fat (for example axial series 4, image 55). There is diffuse stranding and edema of the subcutaneous soft tissues. Two drainage tubes noted. There is a midline vertical anterior pelvic wall surgical incision and overlying cutaneous staples. No drainable fluid collection identified in the anterior abdominal wall. There is small amount of air in the subcutaneous soft tissues of the left anterior upper abdominal wall. Musculoskeletal: Grade 1 L5-S1 anterolisthesis. No acute osseous pathology. IMPRESSION: 1. Postsurgical changes of ventral hernia repair with small areas of defect within the hernia repair and protrusion of small omental fat. 2. Loculated appearing collection in the right lower abdomen anteriorly, likely a developing small abscess. 3. Punctate foci of air in the upper abdomen, possibly postsurgical. Clinical correlation is recommended. 4. Postsurgical changes of the anterior abdominal wall with stranding of the subcutaneous soft tissues. No drainable fluid collection or abscess identified in the abdominal wall. Electronically Signed   By: Anner Crete M.D.   On: 04/12/2019 16:58   DG Chest Portable 1 View  Result Date: 04/12/2019 CLINICAL DATA:  Shortness of breath EXAM: PORTABLE CHEST 1 VIEW COMPARISON:  November 17, 2011 FINDINGS: The lungs are clear. The heart size and pulmonary vascularity are normal. No evident adenopathy.  There is evidence of an old healed fracture of the left clavicle. No pneumothorax. IMPRESSION: Lungs clear. Cardiac silhouette within normal limits. No evident adenopathy. Electronically Signed   By: Lowella Grip III M.D.   On: 04/12/2019 15:36    Procedures .Critical Care Performed by: Lorayne Bender, PA-C Authorized by: Lorayne Bender, PA-C   Critical care provider statement:    Critical care time (minutes):  35   Critical care time was exclusive of:  Separately billable procedures and treating other patients   Critical care was necessary to treat or prevent imminent or life-threatening deterioration of the following conditions: Post surgical abscess; hypokalemia.   Critical care was time spent personally by me on the following activities:  Development of treatment plan with patient or surrogate, discussions with consultants, evaluation of patient's response to treatment, examination of patient, obtaining history from patient or surrogate, ordering and performing treatments and interventions, ordering and review of laboratory studies, ordering and review of radiographic studies, re-evaluation of patient's condition and review of old charts   I assumed direction of critical care for this patient from another provider in my specialty: no     (including critical care time)  Medications Ordered in ED Medications  potassium chloride 10 mEq in 100 mL IVPB (10 mEq Intravenous New Bag/Given 04/12/19 2008)  ondansetron (ZOFRAN) injection 4 mg (has no administration in time range)  piperacillin-tazobactam (ZOSYN) IVPB 3.375 g (has no administration in time range)  morphine 4 MG/ML injection 4 mg (4 mg Intravenous Given 04/12/19 1531)  potassium chloride 10 mEq in 100 mL IVPB (0 mEq Intravenous Stopped 04/12/19 1900)  iohexol (OMNIPAQUE) 350 MG/ML injection 100 mL (100 mLs Intravenous Contrast Given 04/12/19 1612)  sodium chloride 0.9 % bolus 1,000 mL (0 mLs Intravenous Stopped 04/12/19 1748)  sodium  chloride (PF) 0.9 % injection (  Given by Other 04/12/19 1618)  sodium chloride 0.9 % bolus 1,000 mL (1,000 mLs Intravenous New Bag/Given 04/12/19 2009)    ED Course  I have reviewed the triage vital signs and the nursing notes.  Pertinent labs &  imaging results that were available during my care of the patient were reviewed by me and considered in my medical decision making (see chart for details).  Clinical Course as of Apr 11 2018  Wed Apr 12, 2019  1716 Spoke with Dr. Marlou Starks, general surgeon.  We discussed the patient's symptoms, presentation, physical exam findings, and CT results.  He requests a 2 hour Covid test and he will come see the patient.   [SJ]    Clinical Course User Index [SJ] Donovan Persley C, PA-C   MDM Rules/Calculators/A&P                      Patient presents with complaint of abdominal pain, nausea, vomiting, and fever.  She is 3 weeks postop from open hernia repair with mesh. Afebrile here in the ED, but mildly tachycardic.  Nontoxic-appearing.  Not hypotensive.  Leukocytosis but without lactic acidosis. CT with loculated fluid collection possibly representing abscess. Hypokalemia noted and addressed. Patient admitted via general surgery service, who also directed choice of antibiotic.  Findings and plan of care discussed with Shirlyn Goltz, MD.   Vitals:   04/12/19 1500 04/12/19 1515 04/12/19 1600 04/12/19 1626  BP: 112/85  (!) 132/109   Pulse: 97 (!) 103 96   Resp: (!) 23 (!) 25 20   Temp:    99.1 F (37.3 C)  TempSrc:      SpO2: 95% 100% 96%   Weight:      Height:       Vitals:   04/12/19 1730 04/12/19 1800 04/12/19 1900 04/12/19 2000  BP: 105/67 114/70 115/72 120/78  Pulse: 98 92 93 81  Resp: (!) 25 (!) 22 20 (!) 22  Temp:      TempSrc:      SpO2: 99% 98% 97% 97%  Weight:      Height:         Final Clinical Impression(s) / ED Diagnoses Final diagnoses:  Hypokalemia  Non-intractable vomiting with nausea, unspecified vomiting type    Rx / DC  Orders ED Discharge Orders    None       Layla Maw 04/12/19 2039  Added Critical Care Time   Layla Maw 04/13/19 0913    Drenda Freeze, MD 04/13/19 1041

## 2019-04-12 NOTE — ED Notes (Signed)
RN put patient on 2L [place] because SpO2 was 89%. Patient now 98% SpO2.

## 2019-04-12 NOTE — ED Notes (Signed)
After giving patient morphine, this RN monitored patient for 5 minutes to make sure she did not have a reaction due to her dilaudid sensitivity. Upon assessment, patient was not experiencing any side effects. Will continue to monitor.

## 2019-04-12 NOTE — ED Triage Notes (Signed)
2 drains in abd  Hernia surgery x2 weeks ago  C/o N/V/D, chills, generalized abdominal pain, and SOB that started this AM  22 R hand  4 mg zofran given by EMS  2L [place] 97%  130/90 110 HR 98.2 temp EKG WNL

## 2019-04-12 NOTE — ED Notes (Signed)
Patient in CT at this time.

## 2019-04-12 NOTE — Progress Notes (Signed)
PHARMACY NOTE -  [facility] has been assisting with dosing of Zosyn for IAI.  Dosage remains stable at 3.375 g IV q8 hr and need for further dosage adjustment appears unlikely at present given CrCl stable well above adjustment threshold  Pharmacy will sign off, following peripherally for culture results or dose adjustments. Please reconsult if a change in clinical status warrants re-evaluation of dosage.  Reuel Boom, PharmD, BCPS [phone] 04/12/2019, 8:21 PM

## 2019-04-12 NOTE — ED Notes (Signed)
Pure wick has been placed. Suction set to 45mmHg.  

## 2019-04-13 ENCOUNTER — Encounter (HOSPITAL_COMMUNITY): Payer: Self-pay | Admitting: General Surgery

## 2019-04-13 DIAGNOSIS — Z87442 Personal history of urinary calculi: Secondary | ICD-10-CM | POA: Diagnosis not present

## 2019-04-13 DIAGNOSIS — E785 Hyperlipidemia, unspecified: Secondary | ICD-10-CM | POA: Diagnosis present

## 2019-04-13 DIAGNOSIS — R112 Nausea with vomiting, unspecified: Secondary | ICD-10-CM | POA: Diagnosis present

## 2019-04-13 DIAGNOSIS — I10 Essential (primary) hypertension: Secondary | ICD-10-CM | POA: Diagnosis present

## 2019-04-13 DIAGNOSIS — K219 Gastro-esophageal reflux disease without esophagitis: Secondary | ICD-10-CM | POA: Diagnosis present

## 2019-04-13 DIAGNOSIS — Z20822 Contact with and (suspected) exposure to covid-19: Secondary | ICD-10-CM | POA: Diagnosis present

## 2019-04-13 DIAGNOSIS — Z96651 Presence of right artificial knee joint: Secondary | ICD-10-CM | POA: Diagnosis present

## 2019-04-13 DIAGNOSIS — Z888 Allergy status to other drugs, medicaments and biological substances status: Secondary | ICD-10-CM | POA: Diagnosis not present

## 2019-04-13 DIAGNOSIS — E876 Hypokalemia: Secondary | ICD-10-CM | POA: Diagnosis present

## 2019-04-13 DIAGNOSIS — Z79899 Other long term (current) drug therapy: Secondary | ICD-10-CM | POA: Diagnosis not present

## 2019-04-13 DIAGNOSIS — F419 Anxiety disorder, unspecified: Secondary | ICD-10-CM | POA: Diagnosis present

## 2019-04-13 DIAGNOSIS — F329 Major depressive disorder, single episode, unspecified: Secondary | ICD-10-CM | POA: Diagnosis present

## 2019-04-13 DIAGNOSIS — Z885 Allergy status to narcotic agent status: Secondary | ICD-10-CM | POA: Diagnosis not present

## 2019-04-13 DIAGNOSIS — Z8249 Family history of ischemic heart disease and other diseases of the circulatory system: Secondary | ICD-10-CM | POA: Diagnosis not present

## 2019-04-13 DIAGNOSIS — Z91030 Bee allergy status: Secondary | ICD-10-CM | POA: Diagnosis not present

## 2019-04-13 LAB — URINE CULTURE

## 2019-04-13 LAB — CBC
HCT: 34.6 % — ABNORMAL LOW (ref 36.0–46.0)
Hemoglobin: 11 g/dL — ABNORMAL LOW (ref 12.0–15.0)
MCH: 29.1 pg (ref 26.0–34.0)
MCHC: 31.8 g/dL (ref 30.0–36.0)
MCV: 91.5 fL (ref 80.0–100.0)
Platelets: 311 10*3/uL (ref 150–400)
RBC: 3.78 MIL/uL — ABNORMAL LOW (ref 3.87–5.11)
RDW: 14.8 % (ref 11.5–15.5)
WBC: 13.7 10*3/uL — ABNORMAL HIGH (ref 4.0–10.5)
nRBC: 0 % (ref 0.0–0.2)

## 2019-04-13 LAB — BASIC METABOLIC PANEL
Anion gap: 10 (ref 5–15)
BUN: 14 mg/dL (ref 6–20)
CO2: 25 mmol/L (ref 22–32)
Calcium: 8.5 mg/dL — ABNORMAL LOW (ref 8.9–10.3)
Chloride: 102 mmol/L (ref 98–111)
Creatinine, Ser: 1 mg/dL (ref 0.44–1.00)
GFR calc Af Amer: >60 mL/min (ref >60)
GFR calc non Af Amer: >60 mL/min (ref >60)
Glucose, Bld: 118 mg/dL — ABNORMAL HIGH (ref 70–99)
Potassium: 2.9 mmol/L — ABNORMAL LOW (ref 3.5–5.1)
Sodium: 137 mmol/L (ref 135–145)

## 2019-04-13 LAB — HIV ANTIBODY (ROUTINE TESTING W REFLEX): HIV Screen 4th Generation wRfx: NONREACTIVE

## 2019-04-13 MED ORDER — POTASSIUM CHLORIDE 10 MEQ/100ML IV SOLN
10.0000 meq | INTRAVENOUS | Status: AC
  Administered 2019-04-13 (×3): 10 meq via INTRAVENOUS
  Filled 2019-04-13 (×3): qty 100

## 2019-04-13 MED ORDER — POTASSIUM CHLORIDE 20 MEQ PO PACK
20.0000 meq | PACK | Freq: Two times a day (BID) | ORAL | Status: AC
  Administered 2019-04-13 (×2): 20 meq via ORAL
  Filled 2019-04-13 (×2): qty 1

## 2019-04-13 NOTE — Progress Notes (Signed)
Subjective/Chief Complaint: No nausea or abdominal pain this morning   Objective: Vital signs in last 24 hours: Temp:  [98.1 F (36.7 C)-99.1 F (37.3 C)] 98.4 F (36.9 C) (02/18 0549) Pulse Rate:  [63-103] 63 (02/18 0549) Resp:  [19-33] 19 (02/18 0549) BP: (105-132)/(62-109) 108/62 (02/18 0549) SpO2:  [93 %-100 %] 98 % (02/18 0549) Weight:  [109.8 kg] 109.8 kg (02/17 1331)    Intake/Output from previous day: 02/17 0701 - 02/18 0700 In: 436.6 [P.O.:120; IV Piggyback:316.6] Out: 120 [Drains:120] Intake/Output this shift: No intake/output data recorded.  Exam: Awake and alert Abdomen soft, minimally tender, drains serous  Lab Results:  Recent Labs    04/12/19 1444 04/13/19 0438  WBC 14.5* 13.7*  HGB 13.6 11.0*  HCT 41.1 34.6*  PLT 369 311   BMET Recent Labs    04/12/19 1444 04/13/19 0438  NA 134* 137  K 2.6* 2.9*  CL 100 102  CO2 22 25  GLUCOSE 104* 118*  BUN 16 14  CREATININE 1.04* 1.00  CALCIUM 9.3 8.5*   PT/INR No results for input(s): LABPROT, INR in the last 72 hours. ABG No results for input(s): PHART, HCO3 in the last 72 hours.  Invalid input(s): PCO2, PO2  Studies/Results: CT Angio Chest PE W and/or Wo Contrast  Result Date: 04/12/2019 CLINICAL DATA:  Shortness of breath, recent surgery EXAM: CT ANGIOGRAPHY CHEST WITH CONTRAST TECHNIQUE: Multidetector CT imaging of the chest was performed using the standard protocol during bolus administration of intravenous contrast. Multiplanar CT image reconstructions and MIPs were obtained to evaluate the vascular anatomy. CONTRAST:  178mL OMNIPAQUE IOHEXOL 350 MG/ML SOLN COMPARISON:  Same day chest x-ray FINDINGS: Cardiovascular: Satisfactory opacification of the pulmonary arteries to the segmental level. Respiratory motion slightly degrades evaluation of the distal pulmonary arterial levels. No evidence of pulmonary embolism. Normal heart size. No pericardial effusion. Thoracic aorta is nonaneurysmal.  Scattered atherosclerotic calcifications of the aortic arch. Mediastinum/Nodes: No enlarged mediastinal, hilar, or axillary lymph nodes. Small hiatal hernia. Thyroid gland, trachea, and esophagus demonstrate no significant findings. Lungs/Pleura: Linear area of scarring versus atelectasis in the anterior aspect of the right lung apex. Mild bibasilar subsegmental atelectasis. No focal airspace consolidation, pleural effusion, or pneumothorax. Upper Abdomen: There are a few tiny foci of air anterior to the liver within the abdomen (series 2, images 72 and 77) air within the subcutaneous soft tissues of the anterior abdominal wall partially visualized and presumably related to recent abdominal surgery. Musculoskeletal: No chest wall abnormality. No acute or significant osseous findings. Review of the MIP images confirms the above findings. IMPRESSION: 1. No evidence of pulmonary embolism to the segmental branch level. 2. Trace pneumoperitoneum within the abdomen. Findings are presumably related to recent abdominal surgery. Please see dedicated abdominopelvic CT report for full evaluation of the findings within the abdomen. Aortic Atherosclerosis (ICD10-I70.0). Electronically Signed   By: Davina Poke D.O.   On: 04/12/2019 16:49   CT ABDOMEN PELVIS W CONTRAST  Result Date: 04/12/2019 CLINICAL DATA:  59 year old female with hernia surgery 2 weeks ago presenting with abdominal pain. EXAM: CT ABDOMEN AND PELVIS WITH CONTRAST TECHNIQUE: Multidetector CT imaging of the abdomen and pelvis was performed using the standard protocol following bolus administration of intravenous contrast. CONTRAST:  124mL OMNIPAQUE IOHEXOL 350 MG/ML SOLN COMPARISON:  CT abdomen pelvis dated 04/18/2018. FINDINGS: Lower chest: Bibasilar dependent atelectasis. The visualized lung bases are otherwise clear. Partially visualized calcification of the aortic valve annulus. Two tiny foci of air anterior to the liver,  likely postsurgical. Clinical  correlation recommended. Hepatobiliary: Probable mild fatty infiltration of the liver. No intrahepatic biliary ductal dilatation. Cholecystectomy. No retained calcified stone noted in the central CBD. Pancreas: Unremarkable. No pancreatic ductal dilatation or surrounding inflammatory changes. Spleen: Several faint splenic hypodense lesions measuring up to 10 mm are not characterized but may represent cysts or hemangioma. Adrenals/Urinary Tract: The adrenal glands are unremarkable. There is no hydronephrosis on either side. There is symmetric enhancement and excretion of contrast by both kidneys. Small bilateral renal hypodense lesions are suboptimally characterized on this CT, possibly cysts. These can be better evaluated with ultrasound on a nonemergent basis. The visualized ureters are unremarkable. The urinary bladder is minimally distended. There is apparent diffuse thickening of the bladder wall which may be partly related to underdistention. Cystitis is not excluded. Correlation with urinalysis recommended. Stomach/Bowel: Postsurgical changes of partial sigmoid resection with anastomotic suture. There is no bowel obstruction or active inflammation. The appendix is normal. Vascular/Lymphatic: Mild aortoiliac atherosclerotic disease. The IVC is unremarkable. No portal venous gas. There is no adenopathy. Reproductive: The uterus and ovaries are grossly unremarkable. Other: There is stranding of the mesentery in the right lower quadrant anteriorly. A 2.9 x 1.9 cm collection (series 4, image 60 and coronal series 6 image 43) is concerning for developing abscess. There is postsurgical changes of ventral hernia repair. There are apparent small areas of defect within the hernia repair with slight protrusion of the omental fat (for example axial series 4, image 55). There is diffuse stranding and edema of the subcutaneous soft tissues. Two drainage tubes noted. There is a midline vertical anterior pelvic wall surgical  incision and overlying cutaneous staples. No drainable fluid collection identified in the anterior abdominal wall. There is small amount of air in the subcutaneous soft tissues of the left anterior upper abdominal wall. Musculoskeletal: Grade 1 L5-S1 anterolisthesis. No acute osseous pathology. IMPRESSION: 1. Postsurgical changes of ventral hernia repair with small areas of defect within the hernia repair and protrusion of small omental fat. 2. Loculated appearing collection in the right lower abdomen anteriorly, likely a developing small abscess. 3. Punctate foci of air in the upper abdomen, possibly postsurgical. Clinical correlation is recommended. 4. Postsurgical changes of the anterior abdominal wall with stranding of the subcutaneous soft tissues. No drainable fluid collection or abscess identified in the abdominal wall. Electronically Signed   By: Anner Crete M.D.   On: 04/12/2019 16:58   DG Chest Portable 1 View  Result Date: 04/12/2019 CLINICAL DATA:  Shortness of breath EXAM: PORTABLE CHEST 1 VIEW COMPARISON:  November 17, 2011 FINDINGS: The lungs are clear. The heart size and pulmonary vascularity are normal. No evident adenopathy. There is evidence of an old healed fracture of the left clavicle. No pneumothorax. IMPRESSION: Lungs clear. Cardiac silhouette within normal limits. No evident adenopathy. Electronically Signed   By: Lowella Grip III M.D.   On: 04/12/2019 15:36    Anti-infectives: Anti-infectives (From admission, onward)   Start     Dose/Rate Route Frequency Ordered Stop   04/13/19 0600  piperacillin-tazobactam (ZOSYN) IVPB 3.375 g     3.375 g 12.5 mL/hr over 240 Minutes Intravenous Every 8 hours 04/12/19 2019     04/12/19 2100  piperacillin-tazobactam (ZOSYN) IVPB 3.375 g     3.375 g 100 mL/hr over 30 Minutes Intravenous  Once 04/12/19 2020 04/12/19 2151      Assessment/Plan: S/p open incisional hernia repair with mesh  Questionable developing intra-abdominal  abscess Hypokalemia  Continue IV  fluids and antibiotics Replace potassium Advance po Follow labs   LOS: 0 days    [name] 04/13/2019

## 2019-04-13 NOTE — Progress Notes (Signed)
Original Honeycomb dressing removed, covered with new ABD pad, dried blood removed from midline staples, no drainage noted. Bilat JP dressings changed, insertion site cleaned.

## 2019-04-14 LAB — CBC
HCT: 34.6 % — ABNORMAL LOW (ref 36.0–46.0)
Hemoglobin: 11.1 g/dL — ABNORMAL LOW (ref 12.0–15.0)
MCH: 29.4 pg (ref 26.0–34.0)
MCHC: 32.1 g/dL (ref 30.0–36.0)
MCV: 91.8 fL (ref 80.0–100.0)
Platelets: 325 10*3/uL (ref 150–400)
RBC: 3.77 MIL/uL — ABNORMAL LOW (ref 3.87–5.11)
RDW: 15 % (ref 11.5–15.5)
WBC: 9.2 10*3/uL (ref 4.0–10.5)
nRBC: 0 % (ref 0.0–0.2)

## 2019-04-14 LAB — BASIC METABOLIC PANEL
Anion gap: 7 (ref 5–15)
BUN: 11 mg/dL (ref 6–20)
CO2: 24 mmol/L (ref 22–32)
Calcium: 8.7 mg/dL — ABNORMAL LOW (ref 8.9–10.3)
Chloride: 108 mmol/L (ref 98–111)
Creatinine, Ser: 0.87 mg/dL (ref 0.44–1.00)
GFR calc Af Amer: >60 mL/min (ref >60)
GFR calc non Af Amer: >60 mL/min (ref >60)
Glucose, Bld: 109 mg/dL — ABNORMAL HIGH (ref 70–99)
Potassium: 3.7 mmol/L (ref 3.5–5.1)
Sodium: 139 mmol/L (ref 135–145)

## 2019-04-14 MED ORDER — AMOXICILLIN-POT CLAVULANATE 875-125 MG PO TABS: 1.0000 | ORAL_TABLET | Freq: Two times a day (BID) | ORAL | 0 refills | Status: AC

## 2019-04-14 NOTE — Progress Notes (Signed)
Pt is being discharged home today with bilateral JP drains in place per orders. Pt educated on JP drain care, supplies provided for home care. 44 abdominal staples removed from surgical incision per orders. Pt tolerated well. Incision healing appropriately, edges attached & approximated, no drainage, pt denies pain. Discharge instructions including medications and follow up appointments given. Pt had no further questions at this time.

## 2019-04-14 NOTE — Plan of Care (Signed)
Pt has not required pain medication thus far but understands to report any pain concerns

## 2019-04-14 NOTE — Progress Notes (Signed)
Patient ID: [name] [name], female   DOB: Jul 23, 1960, 59 y.o.   MRN: [id number]   Feeling much better No nausea.  Tolerating regular diet Abdomen soft, drains stable  WBC and K normal  Plan: D/c home with drains Remove skin staple prior to discharge

## 2019-04-14 NOTE — Discharge Instructions (Signed)
Continue to record drain output  Call office to set up appointment for next Tuesday

## 2019-04-14 NOTE — Discharge Summary (Signed)
Physician Discharge Summary  Patient ID: MIAYA FORESMAN MRN: [id number] DOB/AGE: 1960/11/26 59 y.o.  Admit date: 04/12/2019 Discharge date: 04/14/2019  Admission Diagnoses:  Discharge Diagnoses:  Active Problems:   Vomiting hypokalemia  Discharged Condition: good  Hospital Course: admitted with nausea, vomiting, hypokalemia post op.  WBC elevated for not totally clear reason.  She quickly improved with IV fluids and antibiotics. Discharged home hospital day #2  Consults: None  Significant Diagnostic Studies:   Treatments: IV hydration  Discharge Exam: Blood pressure 134/75, pulse (!) 58, temperature 98.2 F (36.8 C), temperature source Oral, resp. rate 14, height 5\' 5"  (1.651 m), weight 109.8 kg, SpO2 96 %. General appearance: alert, cooperative and no distress Resp: clear to auscultation bilaterally Cardio: regular rate and rhythm, S1, S2 normal, no murmur, click, rub or gallop Incision/Wound:abdomen soft, non-tender, incision clean, drains serous  Disposition: Discharge disposition: 01-Home or Self Care        Allergies as of 04/14/2019      Reactions   Dilaudid [hydromorphone Hcl] Itching   Quinapril Hcl Hives, Swelling   Yellow Jacket Venom [bee Venom] Swelling   Swelling at site of bite      Medication List    TAKE these medications   acetaminophen 500 MG tablet Commonly known as: TYLENOL Take 1,000 mg by mouth every 6 (six) hours as needed for moderate pain.   amoxicillin-clavulanate 875-125 MG tablet Commonly known as: Augmentin Take 1 tablet by mouth 2 (two) times daily for 10 days.   fluticasone 50 MCG/ACT nasal spray Commonly known as: FLONASE Place 1 spray into both nostrils daily as needed for allergies.   loratadine 10 MG tablet Commonly known as: CLARITIN Take 10 mg by mouth daily as needed (seasonal allergies).   METAMUCIL PO Take 1 Dose by mouth daily.   methocarbamol 500 MG tablet Commonly known as: ROBAXIN Take 1 tablet (500 mg  total) by mouth every 6 (six) hours as needed for muscle spasms.   naproxen sodium 220 MG tablet Commonly known as: ALEVE Take 440 mg by mouth daily as needed (pain).   oxyCODONE 5 MG immediate release tablet Commonly known as: Oxy IR/ROXICODONE Take 1 tablet (5 mg total) by mouth every 6 (six) hours as needed for moderate pain or severe pain.   pantoprazole 40 MG tablet Commonly known as: PROTONIX Take 40 mg by mouth daily.   rOPINIRole 0.25 MG tablet Commonly known as: REQUIP Take 0.25 mg by mouth at bedtime.   sertraline 100 MG tablet Commonly known as: ZOLOFT Take 100 mg by mouth daily.   triamterene-hydrochlorothiazide 37.5-25 MG tablet Commonly known as: MAXZIDE-25 Take 1 tablet by mouth daily.   Vitamin D3 25 MCG (1000 UT) Caps Take 1,000 Units by mouth daily.      Follow-up Information    [name], MD. Call on 04/18/2019.   Specialty: General Surgery Contact information: [address] [place] [address] [phone]           Signed: [name] 04/14/2019, 8:06 AM

## 2019-04-17 LAB — CULTURE, BLOOD (ROUTINE X 2)
Culture: NO GROWTH
Culture: NO GROWTH
Special Requests: ADEQUATE
Special Requests: ADEQUATE

## 2019-04-22 ENCOUNTER — Ambulatory Visit: Payer: BC Managed Care – PPO | Attending: Internal Medicine

## 2019-04-22 DIAGNOSIS — Z23 Encounter for immunization: Secondary | ICD-10-CM | POA: Insufficient documentation

## 2019-04-22 NOTE — Progress Notes (Signed)
   Covid-19 Vaccination Clinic  Name:  [name] [name]    MRN: [id number] DOB: 09-27-1960  04/22/2019  [name] was observed post Covid-19 immunization for 15 minutes without incidence. She was provided with Vaccine Information Sheet and instruction to access the V-Safe system.   [name] was instructed to call 911 with any severe reactions post vaccine: Marland Kitchen Difficulty breathing  . Swelling of your face and throat  . A fast heartbeat  . A bad rash all over your body  . Dizziness and weakness    Immunizations Administered    Name Date Dose VIS Date Route   Pfizer COVID-19 Vaccine 04/22/2019  1:19 PM 0.3 mL 02/03/2019 Intramuscular   Manufacturer: [place]   Lot: [id number]   [place]: [id number]

## 2019-05-13 ENCOUNTER — Ambulatory Visit: Payer: BC Managed Care – PPO | Attending: Internal Medicine

## 2019-05-13 DIAGNOSIS — Z23 Encounter for immunization: Secondary | ICD-10-CM

## 2019-05-13 NOTE — Progress Notes (Signed)
   Covid-19 Vaccination Clinic  Name:  [name] [name]    MRN: [id number] DOB: 1960/12/06  05/13/2019  [name] was observed post Covid-19 immunization for 15 minutes without incident. She was provided with Vaccine Information Sheet and instruction to access the V-Safe system.   [name] was instructed to call 911 with any severe reactions post vaccine: Marland Kitchen Difficulty breathing  . Swelling of face and throat  . A fast heartbeat  . A bad rash all over body  . Dizziness and weakness   Immunizations Administered    Name Date Dose VIS Date Route   Pfizer COVID-19 Vaccine 05/13/2019  3:37 PM 0.3 mL 02/03/2019 Intramuscular   Manufacturer: [place]   Lot: [id number]   [place]: [id number]

## 2020-08-08 ENCOUNTER — Other Ambulatory Visit: Payer: Self-pay | Admitting: Obstetrics and Gynecology

## 2020-08-08 DIAGNOSIS — R928 Other abnormal and inconclusive findings on diagnostic imaging of breast: Secondary | ICD-10-CM

## 2020-08-20 ENCOUNTER — Ambulatory Visit
Admission: RE | Admit: 2020-08-20 | Discharge: 2020-08-20 | Disposition: A | Discharge status: Home / Self Care | Payer: BC Managed Care – PPO | Source: Ambulatory Visit | Attending: Obstetrics and Gynecology | Admitting: Obstetrics and Gynecology

## 2020-08-20 ENCOUNTER — Other Ambulatory Visit: Payer: Self-pay

## 2020-08-20 DIAGNOSIS — R928 Other abnormal and inconclusive findings on diagnostic imaging of breast: Secondary | ICD-10-CM

## 2022-02-21 ENCOUNTER — Encounter (HOSPITAL_BASED_OUTPATIENT_CLINIC_OR_DEPARTMENT_OTHER): Payer: Self-pay | Admitting: Emergency Medicine

## 2022-02-21 ENCOUNTER — Emergency Department (HOSPITAL_BASED_OUTPATIENT_CLINIC_OR_DEPARTMENT_OTHER): Payer: BC Managed Care – PPO

## 2022-02-21 ENCOUNTER — Other Ambulatory Visit: Payer: Self-pay

## 2022-02-21 DIAGNOSIS — S52501A Unspecified fracture of the lower end of right radius, initial encounter for closed fracture: Secondary | ICD-10-CM | POA: Diagnosis not present

## 2022-02-21 DIAGNOSIS — S6991XA Unspecified injury of right wrist, hand and finger(s), initial encounter: Secondary | ICD-10-CM | POA: Diagnosis present

## 2022-02-21 DIAGNOSIS — W548XXA Other contact with dog, initial encounter: Secondary | ICD-10-CM | POA: Insufficient documentation

## 2022-02-21 DIAGNOSIS — M7989 Other specified soft tissue disorders: Secondary | ICD-10-CM | POA: Diagnosis not present

## 2022-02-21 MED ORDER — ACETAMINOPHEN 325 MG PO TABS
650.0000 mg | ORAL_TABLET | Freq: Once | ORAL | Status: AC
  Administered 2022-02-21: 650 mg via ORAL
  Filled 2022-02-21: qty 2

## 2022-02-21 NOTE — ED Triage Notes (Addendum)
Pt presents to ED POV from home. Pt c/o R wrist pain s/p mechanical fall. Pt reports no head injury, no blood thinners, no LOC. R radial +2

## 2022-02-22 ENCOUNTER — Emergency Department (HOSPITAL_BASED_OUTPATIENT_CLINIC_OR_DEPARTMENT_OTHER)
Admission: EM | Admit: 2022-02-22 | Discharge: 2022-02-22 | Disposition: A | Discharge status: Home / Self Care | Payer: BC Managed Care – PPO | Attending: Emergency Medicine | Admitting: Emergency Medicine

## 2022-02-22 DIAGNOSIS — S52501A Unspecified fracture of the lower end of right radius, initial encounter for closed fracture: Secondary | ICD-10-CM

## 2022-02-22 NOTE — ED Notes (Signed)
NT [name] [name] applied short arm splint

## 2022-02-22 NOTE — ED Provider Notes (Signed)
[facility]  Provider Note  CSN: [id number] Arrival date & time: 02/21/22 2047  History Chief Complaint  Patient presents with   Lytle Michaels    [name] [name] is a 61 y.o. female reports she was pulled to the ground by her dog earlier in the evening injuring her R wrist. She is right handed. No head injury or other areas of concern. Pain controlled with ice and APAP.    Home Medications Prior to Admission medications   Medication Sig Start Date End Date Taking? Authorizing Provider  acetaminophen (TYLENOL) 500 MG tablet Take 1,000 mg by mouth every 6 (six) hours as needed for moderate pain.    [provider]  Cholecalciferol (VITAMIN D3) 1000 units CAPS Take 1,000 Units by mouth daily.     [provider]  fluticasone (FLONASE) 50 MCG/ACT nasal spray Place 1 spray into both nostrils daily as needed for allergies.  07/10/13   [provider]  loratadine (CLARITIN) 10 MG tablet Take 10 mg by mouth daily as needed (seasonal allergies).    [provider]  methocarbamol (ROBAXIN) 500 MG tablet Take 1 tablet (500 mg total) by mouth every 6 (six) hours as needed for muscle spasms. 03/25/19   Kinsinger, Arta Bruce, MD  naproxen sodium (ALEVE) 220 MG tablet Take 440 mg by mouth daily as needed (pain).    [provider]  oxyCODONE (OXY IR/ROXICODONE) 5 MG immediate release tablet Take 1 tablet (5 mg total) by mouth every 6 (six) hours as needed for moderate pain or severe pain. Patient not taking: Reported on 04/12/2019 03/24/19   [name], MD  pantoprazole (PROTONIX) 40 MG tablet Take 40 mg by mouth daily.     [provider]  Psyllium (METAMUCIL PO) Take 1 Dose by mouth daily.    [provider]  rOPINIRole (REQUIP) 0.25 MG tablet Take 0.25 mg by mouth at bedtime.  05/05/15   [provider]  sertraline (ZOLOFT) 100 MG tablet Take 100 mg by mouth daily.     [provider]   triamterene-hydrochlorothiazide (MAXZIDE-25) 37.5-25 MG per tablet Take 1 tablet by mouth daily.    [provider]     Allergies    Dilaudid [hydromorphone hcl], Quinapril hcl, and Yellow jacket venom [bee venom]   Review of Systems   Review of Systems Please see HPI for pertinent positives and negatives  Physical Exam BP (!) 164/94 (BP Location: Left Arm)   Pulse 65   Temp 98.4 F (36.9 C)   Resp 18   SpO2 100%   Physical Exam Vitals and nursing note reviewed.  HENT:     Head: Normocephalic.     Nose: Nose normal.  Eyes:     Extraocular Movements: Extraocular movements intact.  Cardiovascular:     Pulses: Normal pulses.  Pulmonary:     Effort: Pulmonary effort is normal.  Musculoskeletal:        General: Swelling (radial wrist) and tenderness present.     Cervical back: Neck supple.  Skin:    Findings: No rash (on exposed skin).  Neurological:     Mental Status: She is alert and oriented to person, place, and time.  Psychiatric:        Mood and Affect: Mood normal.     ED Results / Procedures / Treatments   EKG None  Procedures Procedures  Medications Ordered in the ED Medications  acetaminophen (TYLENOL) tablet 650 mg (650 mg Oral Given 02/21/22 2120)  Initial Impression and Plan  Patient here with mechanical fall and R wrist injury.I personally viewed the images from radiology studies and agree with radiologist interpretation: Xray shows a distal radius fracture. Plan splint for comfort. Patient declines pain medications. Hand follow up, RTED for any other concerns  ED Course       MDM Rules/Calculators/A&P Medical Decision Making Problems Addressed: Closed fracture of distal end of right radius, unspecified fracture morphology, initial encounter: acute illness or injury  Amount and/or Complexity of Data Reviewed Radiology: ordered and independent interpretation performed. Decision-making details documented in ED  Course.  Risk OTC drugs.    Final Clinical Impression(s) / ED Diagnoses Final diagnoses:  Closed fracture of distal end of right radius, unspecified fracture morphology, initial encounter    Rx / DC Orders ED Discharge Orders     None        Truddie Hidden, MD 02/22/22 [phone]

## 2022-12-14 ENCOUNTER — Encounter: Payer: Self-pay | Admitting: Gastroenterology

## 2023-03-11 ENCOUNTER — Ambulatory Visit: Payer: BC Managed Care – PPO | Admitting: Gastroenterology

## 2023-03-11 NOTE — Progress Notes (Deleted)
HPI : [name] [name] is a 63 y.o. female with a history of anxiety, depression, obesity, arthritis and history of complicated diverticulitis status post Hartman's in 2018 who is referred to [place] by Creola Corn, MD for further evaluation and management of dysphagia    Colonoscopy 2018 -Via ostomy and via anus:  - One 2 mm polyp in the sigmoid colon, removed with a cold snare. Resected and retrieved.  - A few small diverticulum remain in the left colon.  - Mild diversion colitis in Hartman's pouch.  - Medium sized internal hemorrhoids.  - The examination was otherwise normal on direct and retroflexion views  Surgical [P], sigmoid, polyp - BENIGN COLONIC MUCOSA WITH BENIGN LYMPHOID AGGREGATE. - NO ADENOMATOUS CHANGE OR MALIGNANCY.  Recommended to repeat colonoscopy in 10 years.  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     Past Surgical History:  Procedure Laterality Date   CESAREAN SECTION     COLECTOMY WITH COLOSTOMY CREATION/HARTMANN PROCEDURE N/A 06/10/2016   Procedure: OPEN SIGMOID COLECTOMY WITH DRAINAGE OF ABDOMINAL ABSCESS AND MOBILIZATION OF SPLENIC FLEXURE;  Surgeon: Violeta Gelinas, MD;  Location: [facility];  Service: General;  Laterality: N/A;   COLOSTOMY N/A 06/10/2016   Procedure: COLOSTOMY WITH HARTMANN PROCEDURE;  Surgeon: Violeta Gelinas, MD;  Location: [facility];  Service: General;  Laterality: N/A;   COLOSTOMY TAKEDOWN N/A 12/10/2016   Procedure: LAPAROSCOPIC COLOSTOMY REVERSAL;  Surgeon: Romie Levee, MD;  Location: [facility];  Service: General;  Laterality: N/A;    INCISIONAL HERNIA REPAIR N/A 03/23/2019   Procedure: LAPAROSCOPIC CONVERTED TO OPEN INCISIONAL HERNIA REPAIR WITH MESH;  Surgeon: Abigail Miyamoto, MD;  Location: [facility];  Service: General;  Laterality: N/A;   LAPAROSCOPIC CHOLECYSTECTOMY W/ CHOLANGIOGRAPHY  2012   Dr Violeta Gelinas   TONSILLECTOMY     as child-tonsils and adenoids   TOTAL KNEE ARTHROPLASTY  11/23/2011   Dr Charlann Boxer.  Procedure: TOTAL KNEE ARTHROPLASTY;  Surgeon: Shelda Pal, MD;  Location: [facility];  Service: Orthopedics;  Laterality: Right;   uterine ablation  2003   Family History  Problem Relation Age of Onset   Hypertension Mother    Bone cancer Father    Bladder Cancer Father    Colon cancer Neg Hx    Social History   Tobacco Use   Smoking status: Never   Smokeless tobacco: Never  Vaping Use   Vaping status: Never Used  Substance Use Topics   Alcohol use: No   Drug use: No   Current Outpatient Medications  Medication Sig Dispense Refill   acetaminophen (TYLENOL) 500 MG tablet Take 1,000 mg by mouth every 6 (six) hours as needed for moderate pain.     Cholecalciferol (VITAMIN D3) 1000 units CAPS Take 1,000 Units by mouth daily.      fluticasone (FLONASE) 50 MCG/ACT nasal spray Place 1 spray into both nostrils daily as needed for allergies.      loratadine (CLARITIN) 10 MG tablet Take 10 mg by  mouth daily as needed (seasonal allergies).     methocarbamol (ROBAXIN) 500 MG tablet Take 1 tablet (500 mg total) by mouth every 6 (six) hours as needed for muscle spasms. 30 tablet 0   naproxen sodium (ALEVE) 220 MG tablet Take 440 mg by mouth daily as needed (pain).     oxyCODONE (OXY IR/ROXICODONE) 5 MG immediate release tablet Take 1 tablet (5 mg total) by mouth every 6 (six) hours as needed for moderate pain or severe pain. (Patient not taking: Reported on 04/12/2019) 25 tablet 0   pantoprazole (PROTONIX) 40 MG tablet Take 40 mg by mouth daily.      Psyllium (METAMUCIL PO) Take 1 Dose by mouth daily.     rOPINIRole  (REQUIP) 0.25 MG tablet Take 0.25 mg by mouth at bedtime.   6   sertraline (ZOLOFT) 100 MG tablet Take 100 mg by mouth daily.      triamterene-hydrochlorothiazide (MAXZIDE-25) 37.5-25 MG per tablet Take 1 tablet by mouth daily.     No current facility-administered medications for this visit.   Allergies  Allergen Reactions   Dilaudid [Hydromorphone Hcl] Itching   Quinapril Hcl Hives and Swelling   Yellow Jacket Venom [Bee Venom] Swelling    Swelling at site of bite     Review of Systems: All systems reviewed and negative except where noted in HPI.    No results found.  Physical Exam: There were no vitals taken for this visit. Constitutional: Pleasant,well-developed, ***female in no acute distress. HEENT: Normocephalic and atraumatic. Conjunctivae are normal. No scleral icterus. Neck supple.  Cardiovascular: Normal rate, regular rhythm.  Pulmonary/chest: Effort normal and breath sounds normal. No wheezing, rales or rhonchi. Abdominal: Soft, nondistended, nontender. Bowel sounds active throughout. There are no masses palpable. No hepatomegaly. Extremities: no edema Lymphadenopathy: No cervical adenopathy noted. Neurological: Alert and oriented to person place and time. Skin: Skin is warm and dry. No rashes noted. Psychiatric: Normal mood and affect. Behavior is normal.  CBC    Component Value Date/Time   WBC 9.2 04/14/2019 0441   RBC 3.77 (L) 04/14/2019 0441   HGB 11.1 (L) 04/14/2019 0441   HCT 34.6 (L) 04/14/2019 0441   PLT 325 04/14/2019 0441   MCV 91.8 04/14/2019 0441   MCH 29.4 04/14/2019 0441   MCHC 32.1 04/14/2019 0441   RDW 15.0 04/14/2019 0441   LYMPHSABS 0.4 (L) 04/12/2019 1444   MONOABS 1.0 04/12/2019 1444   EOSABS 0.0 04/12/2019 1444   BASOSABS 0.0 04/12/2019 1444    CMP     Component Value Date/Time   NA 139 04/14/2019 0441   K 3.7 04/14/2019 0441   CL 108 04/14/2019 0441   CO2 24 04/14/2019 0441   GLUCOSE 109 (H) 04/14/2019 0441   BUN 11  04/14/2019 0441   CREATININE 0.87 04/14/2019 0441   CALCIUM 8.7 (L) 04/14/2019 0441   PROT 8.2 (H) 04/12/2019 1444   ALBUMIN 4.2 04/12/2019 1444   AST 26 04/12/2019 1444   ALT 27 04/12/2019 1444   ALKPHOS 61 04/12/2019 1444   BILITOT 0.8 04/12/2019 1444   GFRNONAA >60 04/14/2019 0441   GFRAA >60 04/14/2019 0441       Latest Ref Rng & Units 04/14/2019    4:41 AM 04/13/2019    4:38 AM 04/12/2019    2:44 PM  CBC EXTENDED  WBC 4.0 - 10.5 K/uL 9.2  13.7  14.5   RBC 3.87 - 5.11 MIL/uL 3.77  3.78  4.62   Hemoglobin 12.0 - 15.0 g/dL  11.1  11.0  13.6   HCT 36.0 - 46.0 % 34.6  34.6  41.1   Platelets 150 - 400 K/uL 325  311  369   NEUT# 1.7 - 7.7 K/uL   12.9   Lymph# 0.7 - 4.0 K/uL   0.4       ASSESSMENT AND PLAN:  Creola Corn, MD
